# Patient Record
Sex: Female | Born: 1965 | Race: Black or African American | Hispanic: No | Marital: Married | Smoking: Never smoker
Health system: Southern US, Community
[De-identification: ages and names within clinical notes are randomized; demographics above are authoritative.]

## PROBLEM LIST (undated history)

## (undated) DIAGNOSIS — R5383 Other fatigue: Secondary | ICD-10-CM

## (undated) DIAGNOSIS — E785 Hyperlipidemia, unspecified: Secondary | ICD-10-CM

## (undated) DIAGNOSIS — G43829 Menstrual migraine, not intractable, without status migrainosus: Secondary | ICD-10-CM

## (undated) DIAGNOSIS — K5900 Constipation, unspecified: Secondary | ICD-10-CM

## (undated) DIAGNOSIS — R7303 Prediabetes: Secondary | ICD-10-CM

## (undated) DIAGNOSIS — E559 Vitamin D deficiency, unspecified: Secondary | ICD-10-CM

## (undated) DIAGNOSIS — M549 Dorsalgia, unspecified: Secondary | ICD-10-CM

## (undated) DIAGNOSIS — R0602 Shortness of breath: Secondary | ICD-10-CM

## (undated) HISTORY — DX: Prediabetes: R73.03

## (undated) HISTORY — PX: KELOID EXCISION: SHX1856

## (undated) HISTORY — DX: Hyperlipidemia, unspecified: E78.5

## (undated) HISTORY — PX: TUBAL LIGATION: SHX77

## (undated) HISTORY — DX: Dorsalgia, unspecified: M54.9

## (undated) HISTORY — DX: Other fatigue: R53.83

## (undated) HISTORY — DX: Shortness of breath: R06.02

## (undated) HISTORY — DX: Menstrual migraine, not intractable, without status migrainosus: G43.829

## (undated) HISTORY — DX: Constipation, unspecified: K59.00

## (undated) HISTORY — DX: Vitamin D deficiency, unspecified: E55.9

---

## 1998-01-15 ENCOUNTER — Other Ambulatory Visit: Admission: RE | Admit: 1998-01-15 | Discharge: 1998-01-15 | Payer: Self-pay | Admitting: Obstetrics and Gynecology

## 2000-02-12 ENCOUNTER — Other Ambulatory Visit: Admission: RE | Admit: 2000-02-12 | Discharge: 2000-02-12 | Payer: Self-pay | Admitting: Obstetrics and Gynecology

## 2000-07-10 ENCOUNTER — Emergency Department (HOSPITAL_COMMUNITY): Admission: EM | Admit: 2000-07-10 | Discharge: 2000-07-10 | Payer: Self-pay | Admitting: Emergency Medicine

## 2001-02-20 ENCOUNTER — Other Ambulatory Visit: Admission: RE | Admit: 2001-02-20 | Discharge: 2001-02-20 | Payer: Self-pay | Admitting: Obstetrics and Gynecology

## 2001-04-15 ENCOUNTER — Emergency Department (HOSPITAL_COMMUNITY): Admission: EM | Admit: 2001-04-15 | Discharge: 2001-04-15 | Payer: Self-pay | Admitting: Emergency Medicine

## 2001-04-15 ENCOUNTER — Encounter: Payer: Self-pay | Admitting: Emergency Medicine

## 2001-09-04 ENCOUNTER — Encounter: Admission: RE | Admit: 2001-09-04 | Discharge: 2001-12-03 | Payer: Self-pay | Admitting: Internal Medicine

## 2002-04-17 ENCOUNTER — Other Ambulatory Visit: Admission: RE | Admit: 2002-04-17 | Discharge: 2002-04-17 | Payer: Self-pay | Admitting: Obstetrics and Gynecology

## 2002-04-19 ENCOUNTER — Emergency Department (HOSPITAL_COMMUNITY): Admission: EM | Admit: 2002-04-19 | Discharge: 2002-04-19 | Payer: Self-pay | Admitting: Emergency Medicine

## 2003-05-13 ENCOUNTER — Other Ambulatory Visit: Admission: RE | Admit: 2003-05-13 | Discharge: 2003-05-13 | Payer: Self-pay | Admitting: Obstetrics and Gynecology

## 2003-07-24 ENCOUNTER — Encounter: Admission: RE | Admit: 2003-07-24 | Discharge: 2003-07-24 | Payer: Self-pay | Admitting: Obstetrics and Gynecology

## 2004-06-22 ENCOUNTER — Other Ambulatory Visit: Admission: RE | Admit: 2004-06-22 | Discharge: 2004-06-22 | Payer: Self-pay | Admitting: Obstetrics and Gynecology

## 2005-12-07 ENCOUNTER — Ambulatory Visit (HOSPITAL_COMMUNITY): Admission: RE | Admit: 2005-12-07 | Discharge: 2005-12-07 | Payer: Self-pay | Admitting: Obstetrics and Gynecology

## 2005-12-07 ENCOUNTER — Encounter (INDEPENDENT_AMBULATORY_CARE_PROVIDER_SITE_OTHER): Payer: Self-pay | Admitting: *Deleted

## 2006-04-26 HISTORY — PX: ENDOMETRIAL ABLATION: SHX621

## 2009-05-16 ENCOUNTER — Encounter: Admission: RE | Admit: 2009-05-16 | Discharge: 2009-05-16 | Payer: Self-pay | Admitting: Internal Medicine

## 2010-05-17 ENCOUNTER — Encounter: Payer: Self-pay | Admitting: Internal Medicine

## 2010-05-17 ENCOUNTER — Encounter: Payer: Self-pay | Admitting: Obstetrics and Gynecology

## 2010-07-20 ENCOUNTER — Encounter (INDEPENDENT_AMBULATORY_CARE_PROVIDER_SITE_OTHER): Payer: BC Managed Care – PPO | Admitting: Family Medicine

## 2010-07-20 ENCOUNTER — Other Ambulatory Visit: Payer: Self-pay | Admitting: Family Medicine

## 2010-07-20 ENCOUNTER — Other Ambulatory Visit (HOSPITAL_COMMUNITY)
Admission: RE | Admit: 2010-07-20 | Discharge: 2010-07-20 | Disposition: A | Discharge status: Home / Self Care | Payer: BC Managed Care – PPO | Source: Ambulatory Visit | Attending: Family Medicine | Admitting: Family Medicine

## 2010-07-20 DIAGNOSIS — Z0000 Encounter for general adult medical examination without abnormal findings: Secondary | ICD-10-CM

## 2010-07-20 DIAGNOSIS — R5383 Other fatigue: Secondary | ICD-10-CM

## 2010-07-20 DIAGNOSIS — Z124 Encounter for screening for malignant neoplasm of cervix: Secondary | ICD-10-CM | POA: Insufficient documentation

## 2010-07-22 ENCOUNTER — Other Ambulatory Visit: Payer: Self-pay | Admitting: Family Medicine

## 2010-07-22 DIAGNOSIS — Z1231 Encounter for screening mammogram for malignant neoplasm of breast: Secondary | ICD-10-CM

## 2010-08-05 ENCOUNTER — Ambulatory Visit
Admission: RE | Admit: 2010-08-05 | Discharge: 2010-08-05 | Disposition: A | Discharge status: Home / Self Care | Payer: BC Managed Care – PPO | Source: Ambulatory Visit | Attending: Family Medicine | Admitting: Family Medicine

## 2010-08-05 DIAGNOSIS — Z1231 Encounter for screening mammogram for malignant neoplasm of breast: Secondary | ICD-10-CM

## 2010-09-11 NOTE — Discharge Summary (Signed)
Anita Taylor, Anita Taylor              ACCOUNT NO.:  [id number]   MEDICAL RECORD NO.:  [id number]          PATIENT TYPE:  AMB   LOCATION:  SDC                           FACILITY:  [facility]   PHYSICIAN:  Duke Salvia. Marcelle Overlie, M.D.DATE OF BIRTH:  Apr 17, 1966   DATE OF ADMISSION:  DATE OF DISCHARGE:                                 DISCHARGE SUMMARY   DATE OF OUTPATIENT SURGERY:  December 07, 2005   CHIEF COMPLAINT:  For tubal ligation and endometrial ablation.   HISTORY OF PRESENT ILLNESS:  A 45 year old G1, P1 who is currently on OCPs  presents for permanent sterilization and endometrial ablation.  In the past  she has had bothersome menorrhagia and is concerned about coming off the  pill after her tubal and the resumption of heavy menses.  This procedure  including the permanence of the tubal procedure; failure rate of 2-06/998;  other risks relative to bleeding, infection, adjacent organ injury that may  require open or additional surgery reviewed.  The ablation procedure  including D&C hysteroscopy, the rates of hypo- and amenorrhea reviewed with  her which she understands and accepts.   PAST MEDICAL HISTORY:  Allergies:  None.  Current medications:  Allegra,  Maxalt, Ortho Tri-Cyclen.   REVIEW OF SYSTEMS:  Significant for a history of perimenstrual migraine.   FAMILY HISTORY:  Unremarkable.   PHYSICAL EXAMINATION:  VITAL SIGNS:  Temperature 98.2, blood pressure  110/70.  HEENT:  Unremarkable.  NECK:  Supple without masses.  LUNGS:  Clear.  CARDIOVASCULAR:  Regular rate and rhythm without murmurs, rubs, gallops  noted.  BREASTS:  Without masses.  ABDOMEN:  Soft, flat, nontender.  PELVIC:  Normal external genitalia, vagina and cervix clear.  Uterus mid  position, normal size.  Adnexa negative.  EXTREMITIES AND NEUROLOGIC:  Unremarkable.   IMPRESSION:  1. Requests permanent sterilization.  2. History of menorrhagia when not on oral contraceptive pills.   PLAN:  Laparoscopic tubal  ligation by Filshie clip application, D&C  hysteroscopy with NovaSure endometrial ablation.  Procedure and risks  reviewed as above.      Richard M. Marcelle Overlie, M.D.  Electronically Signed     RMH/MEDQ  D:  12/02/2005  T:  12/02/2005  Job:  [id number]

## 2010-09-11 NOTE — Op Note (Signed)
NAMEPHOUA, HOADLEY              ACCOUNT NO.:  [id number]   MEDICAL RECORD NO.:  [id number]          PATIENT TYPE:  AMB   LOCATION:  SDC                           [facility]:  [facility]   PHYSICIAN:  Duke Salvia. Marcelle Overlie, M.D.DATE OF BIRTH:  1965-06-07   DATE OF PROCEDURE:  12/07/2005  DATE OF DISCHARGE:                                 OPERATIVE REPORT   PREOPERATIVE DIAGNOSIS:  Menorrhagia, request for permanent sterilization.   POSTOPERATIVE DIAGNOSIS:  Menorrhagia, request for permanent sterilization.   PROCEDURE.:  Laparoscopic tubal ligation by Filshie clip application,  hysteroscopy with dilatation and curettage, NovaSure endometrial ablation,  paracervical block.   SURGEON:  Duke Salvia. Marcelle Overlie, M.D.   ANESTHESIA:  General endotracheal anesthesia.   COMPLICATIONS:  None.   DRAINS:  In and out Foley catheter.   SPECIMENS REMOVED:  Endometrial curettings.   PROCEDURE AND FINDINGS:  The patient was taken to the operating room.  After  an adequate level of general endotracheal anesthesia was obtained with the  patient legs stirrups, the abdomen, perineum and vagina were prepped and  draped in the usual manner for laparoscopy and D&C.  The bladder was  drained. EUA carried out.  The uterus mid position, normal size, adnexa  negative.  A Hulka tenaculum was positioned.   Attention was directed to the abdomen where the subumbilical area was  infiltrated with 0.5% Marcaine plain.  A small incision was made and the  Veress needle introduced without difficulty.  Intra-abdominal position was  verified by pressure and water testing.  After a 2.5 liter pneumoperitoneum  was created, the laparoscopic trocar and sleeve were then introduced without  difficulty.  There was no evidence of any bleeding or trauma.  The patient  was placed in Trendelenburg.  The pelvic findings were inspected and noted  to be normal. Starting on the right, the Marcaine 0.5% was dripped across  the tube from the  cornu to the fimbriated end, 4-5 mL on each side.  Filshie  clip applicator was then back loaded and Filshie clip applied 2 cm from the  cornu on the right after positively identifying the tube, completely  engulfing it at a right angle with excellent application. The exact same  repeated on the opposite side.  Reinspection of both tubes revealed  excellent placement, there was no bleeding.  These areas were photographed.  The  instrument was removed, gas allowed to escape.  The defect was closed  with 4-0 Dexon subcuticular suture.   Attention was directed to the hysteroscopy. The patient's legs were  extended, speculum was position, paracervical block created by infiltrating  at 3 and 9 o'clock submucosally 5-7 mL 1% Xylocaine on either side after  negative aspiration.  The uterus, itself, was sounded to 11 cm with a  cervical length of 4.  Progressively dilating to a 29 Pratt dilator, the 7  mm continuous flow hysteroscope was then used perform hysteroscopy revealing  no polyps or  submucosal fibroids. A D&C was carried out revealing minimal tissue.  After  this was completed, the NovaSure was carried out per protocol, passing the  CO2 test, cavity width of 4, power setting of 143 for 47 seconds.  She  tolerated this well and went to the recovery room in good condition.      Richard M. Marcelle Overlie, M.D.  Electronically Signed     RMH/MEDQ  D:  12/07/2005  T:  12/07/2005  Job:  [id number]

## 2010-12-16 ENCOUNTER — Telehealth: Payer: Self-pay | Admitting: *Deleted

## 2010-12-16 MED ORDER — SUMATRIPTAN-NAPROXEN SODIUM 85-500 MG PO TABS: 1.0000 | ORAL_TABLET | ORAL | Status: DC | PRN

## 2010-12-16 NOTE — Telephone Encounter (Signed)
Okay for #9

## 2010-12-16 NOTE — Telephone Encounter (Signed)
Cont: Treximet 85/500 to [employer] [facility] [place]?

## 2010-12-16 NOTE — Telephone Encounter (Signed)
Patient called stating that every year when her Treximet is called in a prior authorization is done ( which was previously done by her Gyn), this authorization is only good until 12/19/10 per a letter sent to her from her insurance carrier. Wanted to know if we could do another prior authorization for another year, the only way I know how to get this started is to call in rx to pharmacy and then they usually get the appropriate paperwork to [place] for a prior auth to be initiated by our front office, is it okay to call in an rx for Treximet 5mg 

## 2010-12-16 NOTE — Telephone Encounter (Signed)
Called in treximet 85/500 #9 AD 0rf's to [facility] @ [phone].

## 2011-02-04 ENCOUNTER — Telehealth: Payer: Self-pay | Admitting: Family Medicine

## 2011-02-04 NOTE — Telephone Encounter (Signed)
DONE

## 2011-02-23 ENCOUNTER — Inpatient Hospital Stay (INDEPENDENT_AMBULATORY_CARE_PROVIDER_SITE_OTHER)
Admission: RE | Admit: 2011-02-23 | Discharge: 2011-02-23 | Disposition: A | Discharge status: Home / Self Care | Payer: BC Managed Care – PPO | Source: Ambulatory Visit | Attending: Family Medicine | Admitting: Family Medicine

## 2011-02-23 DIAGNOSIS — M76899 Other specified enthesopathies of unspecified lower limb, excluding foot: Secondary | ICD-10-CM

## 2011-02-23 DIAGNOSIS — M779 Enthesopathy, unspecified: Secondary | ICD-10-CM

## 2011-02-24 ENCOUNTER — Ambulatory Visit: Payer: BC Managed Care – PPO | Admitting: Medical

## 2011-03-25 ENCOUNTER — Telehealth: Payer: Self-pay | Admitting: Family Medicine

## 2011-03-25 MED ORDER — SUMATRIPTAN-NAPROXEN SODIUM 85-500 MG PO TABS: 1.0000 | ORAL_TABLET | ORAL | Status: DC | PRN

## 2011-03-25 NOTE — Telephone Encounter (Signed)
Advise pt med was e-rx'd to her pharmacy

## 2011-03-25 NOTE — Telephone Encounter (Signed)
Called patient to let her know that her medication was called in. Left message.

## 2011-05-05 ENCOUNTER — Telehealth: Payer: Self-pay | Admitting: Internal Medicine

## 2011-05-05 MED ORDER — SUMATRIPTAN-NAPROXEN SODIUM 85-500 MG PO TABS: 1.0000 | ORAL_TABLET | ORAL | Status: DC | PRN

## 2011-05-05 NOTE — Telephone Encounter (Signed)
Refilled rx and left message for patient to call and schedule f/u appt.

## 2011-05-05 NOTE — Telephone Encounter (Signed)
Okay to refill x 1 only.  Needs to schedule f/u appt (Last seen in March?)

## 2011-05-21 ENCOUNTER — Encounter: Payer: Self-pay | Admitting: Internal Medicine

## 2011-06-02 ENCOUNTER — Encounter: Payer: BC Managed Care – PPO | Admitting: Family Medicine

## 2011-06-03 ENCOUNTER — Ambulatory Visit (INDEPENDENT_AMBULATORY_CARE_PROVIDER_SITE_OTHER): Payer: BC Managed Care – PPO | Admitting: Family Medicine

## 2011-06-03 ENCOUNTER — Encounter: Payer: Self-pay | Admitting: Family Medicine

## 2011-06-03 VITALS — BP 112/72 | HR 80 | Ht 65.5 in | Wt 230.0 lb

## 2011-06-03 DIAGNOSIS — G43829 Menstrual migraine, not intractable, without status migrainosus: Secondary | ICD-10-CM | POA: Insufficient documentation

## 2011-06-03 MED ORDER — NORTRIPTYLINE HCL 10 MG PO CAPS: ORAL_CAPSULE | ORAL | Status: DC

## 2011-06-03 MED ORDER — FROVATRIPTAN SUCCINATE 2.5 MG PO TABS: 2.5000 mg | ORAL_TABLET | ORAL | Status: DC | PRN

## 2011-06-03 NOTE — Progress Notes (Signed)
Migraines--headache, nausea, sometimes dizziness, sometimes light and sound sensitivity.  Previously got got 1-2 migraines during her menstrual cycle.  More recently, however, headaches have been more frequent, and not related to cycle.  LMP was 2 weeks ago, had headaches daily for a full week starting after her cycle ended.  Felt just like her usual migraines, and treximet worked, but headache would always recur the next day, back by the next morning.  She is in graduate school, daughter has rigorous dance schedule, and working--has been more stressed and rushed.  Not sleeping as well at night. Has trouble falling asleep, mind racing.  Sometimes falls asleep, but wakes up frequently.  She cut back on caffeine, but didn't help. Not exercising as much, but trying to walk more. Having 4-6 migraines/month.  Past Medical History  Diagnosis Date  . Migraine, menstrual     Past Surgical History  Procedure Date  . Tubal ligation   . Endometrial ablation 08  . Cesarean section     x1  . Keloid excision     ears    History   Social History  . Marital Status: Married    Spouse Name: N/A    Number of Children: 1  . Years of Education: N/A   Occupational History  . pre-K teacher at [employer]   Social History Main Topics  . Smoking status: Never Smoker   . Smokeless tobacco: Never Used  . Alcohol Use: Yes     a glass of wine every 3-6 months.  . Drug Use: No  . Sexually Active: Yes    Birth Control/ Protection: Surgical   Other Topics Concern  . Not on file   Social History Narrative  . No narrative on file    Family History  Problem Relation Age of Onset  . Hypertension Father   . Migraines Sister   . Diabetes Maternal Grandmother    Meds: Treximet prn  No Known Allergies  ROS:  Denies numbness, tingling, weakness, neuro symptoms, fevers, URI symptoms, sinus pain, cough, shortness of breath, GI complaints (other than related to her migraines) or  other concerns  PHYSICAL EXAM: BP 112/72  Pulse 80  Ht 5' 5.5" (1.664 m)  Wt 230 lb (104.327 kg)  BMI 37.69 kg/m2  LMP 05/20/2011 Well developed, pleasant overweight female in no distress HEENT:  PERRL, EOMI, conjunctiva clear, OP clear. Neck: no lymphadenopathy, thyromegaly or mass Neuro: Cranial nerves 2-12 intact.  Normal strength, sensation, finger-to-nose, gait, DTR's  ASSESSMENT/PLAN: 1. Menstrual migraine  frovatriptan (FROVA) 2.5 MG tablet, nortriptyline (PAMELOR) 10 MG capsule   Migraines, increasing in frequency and duration, no longer just related to menstrual cycle.  Likely triggers include increased stress, poor sleep.  Start preventative meds.  Nortriptylene 10 mg at bedtime.  Increase to 2 at bedtime if not adequately helping sleep, or increase after a month if migraines aren't improved at the 10mg .  Try and exercise daily, get a normal routine, don't skip meals, get adequate sleep.  If only mild/early headache--start with EITHER 800 mg ibuprofen OR 2 aspirin OR 2 Aleve, take with caffeine (coffee or coke).  If headache not improving within an hour, then take migraine-specific medication (ie Frova).  Keep headache log and f/u in 2 months, sooner prn

## 2011-06-03 NOTE — Patient Instructions (Signed)
nortriptylene 10 mg at bedtime.  Increase to 2 at bedtime if not adequately helping sleep, or increase after a month if migraines aren't improved at the 10mg .  Try and exercise daily, get a normal routine, don't skip meals, get adequate sleep.  If only mild/early headache--start with EITHER 800 mg ibuprofen OR 2 aspirin OR 2 Aleve, take with caffeine (coffee or coke).  If headache not improving within an hour, then take migraine-specific medication (ie Frova).

## 2011-06-07 ENCOUNTER — Telehealth: Payer: Self-pay | Admitting: Family Medicine

## 2011-06-07 DIAGNOSIS — G43829 Menstrual migraine, not intractable, without status migrainosus: Secondary | ICD-10-CM

## 2011-06-07 MED ORDER — NAPROXEN 500 MG PO TABS: 500.0000 mg | ORAL_TABLET | Freq: Two times a day (BID) | ORAL | Status: DC

## 2011-06-07 MED ORDER — FROVATRIPTAN SUCCINATE 2.5 MG PO TABS: 2.5000 mg | ORAL_TABLET | ORAL | Status: DC | PRN

## 2011-06-07 NOTE — Telephone Encounter (Signed)
E-rxd

## 2011-06-07 NOTE — Telephone Encounter (Signed)
Spoke with patient and she stated that she has used her daughters naproxen and it doesn't work for her. Excedrin migraine sometimes works. Also she was given #2 sample of the Frova, she is ok with trying this for a migraine but would like to know if you could call in some for her? I suggested that she first try the samples but she said she was worried about getting a migraine and using the two samples and not being able to have extra on hand if needed. Please advise. Thanks.

## 2011-06-07 NOTE — Telephone Encounter (Signed)
Left msg for pt to return my call

## 2011-06-07 NOTE — Telephone Encounter (Signed)
I think the Frova may work better at resolving her migraine (due to longer 1/2 life).  I recommend she take Naprosyn 500 mg up to twice daily with food as needed for headaches, and only use the Frova prn migraines, rather than going back on the treximet (which is a combo of naprosyn and imitrex, and isn't to be used as often as she was using it, or it loses efficacy).  If she is agreeable to trying this (and stopping the nortriptylene), then okay for #60 of Naprosyn 500mg .  (which is never to be used along with treximet, if she goes back on that in the future).

## 2011-06-30 ENCOUNTER — Telehealth: Payer: Self-pay | Admitting: *Deleted

## 2011-06-30 NOTE — Telephone Encounter (Deleted)
Patient called and Frova is working well

## 2011-06-30 NOTE — Telephone Encounter (Signed)
Error

## 2011-08-05 ENCOUNTER — Ambulatory Visit: Payer: BC Managed Care – PPO | Admitting: Family Medicine

## 2011-08-24 ENCOUNTER — Other Ambulatory Visit: Payer: Self-pay | Admitting: Family Medicine

## 2011-08-24 DIAGNOSIS — Z1231 Encounter for screening mammogram for malignant neoplasm of breast: Secondary | ICD-10-CM

## 2011-08-30 ENCOUNTER — Ambulatory Visit
Admission: RE | Admit: 2011-08-30 | Discharge: 2011-08-30 | Disposition: A | Discharge status: Home / Self Care | Payer: BC Managed Care – PPO | Source: Ambulatory Visit | Attending: Family Medicine | Admitting: Family Medicine

## 2011-08-30 ENCOUNTER — Ambulatory Visit: Payer: BC Managed Care – PPO

## 2011-08-30 DIAGNOSIS — Z1231 Encounter for screening mammogram for malignant neoplasm of breast: Secondary | ICD-10-CM

## 2011-09-13 ENCOUNTER — Ambulatory Visit (INDEPENDENT_AMBULATORY_CARE_PROVIDER_SITE_OTHER): Payer: BC Managed Care – PPO | Admitting: Family Medicine

## 2011-09-13 ENCOUNTER — Encounter: Payer: Self-pay | Admitting: Family Medicine

## 2011-09-13 VITALS — BP 120/82 | HR 72 | Ht 65.5 in | Wt 226.0 lb

## 2011-09-13 DIAGNOSIS — G43829 Menstrual migraine, not intractable, without status migrainosus: Secondary | ICD-10-CM

## 2011-09-13 DIAGNOSIS — G43909 Migraine, unspecified, not intractable, without status migrainosus: Secondary | ICD-10-CM

## 2011-09-13 MED ORDER — TOPIRAMATE 25 MG PO TABS: ORAL_TABLET | ORAL | Status: DC

## 2011-09-13 MED ORDER — FROVATRIPTAN SUCCINATE 2.5 MG PO TABS: 2.5000 mg | ORAL_TABLET | ORAL | Status: DC | PRN

## 2011-09-13 NOTE — Progress Notes (Signed)
Chief Complaint  Patient presents with  . Follow-up    f/u on migraines.    HPI:  Patient presents for follow up on migraines.  Last seen in February, at which time she was started on nortriptylene, but had to stop due to it causing severe headaches. She has been taking naproxen--took it twice daily initially, but more recently has taken only when she gets a headache.  It works sometimes.  Frova doesn't seem to work as fast, liked the treximet better (but was using it frequently, at least 4x/month, and worked quickly).  With Frova, headaches seem to linger, even after taking the medication.  Had 3-5 migraines this month--unsure if allergies/illness contributed (had a cold, sinus pain), treated it with migraine meds only 3 times, twice headaches got better with decongestants and allergy meds alone.  Headaches are reportedly around her periods, but on further questioning it appears that they are sometimes the week before, sometimes during the cycle, sometimes the week after (so really NOT related to periods).  Usually has a headache that will recur for about 3 days in a row, a couple of times a month.  Her sleeping pattern is better than when we discussed it at last visit.  Has had less stress because she has finished graduate school semester, but starting summer semester this week (so might get worse).  Doesn't seem to note any other triggers.  Past Medical History  Diagnosis Date  . Migraine, menstrual    Past Surgical History  Procedure Date  . Tubal ligation   . Endometrial ablation 08  . Cesarean section     x1  . Keloid excision     ears   History   Social History  . Marital Status: Married    Spouse Name: N/A    Number of Children: 1  . Years of Education: N/A   Occupational History  . pre-K teacher at [employer]   Social History Main Topics  . Smoking status: Never Smoker   . Smokeless tobacco: Never Used  . Alcohol Use: Yes     a glass of wine  every 3-6 months.  . Drug Use: No  . Sexually Active: Yes    Birth Control/ Protection: Surgical   Other Topics Concern  . Not on file   Social History Narrative  . No narrative on file   Current Outpatient Prescriptions on File Prior to Visit  Medication Sig Dispense Refill  . naproxen (NAPROSYN) 500 MG tablet Take 1 tablet (500 mg total) by mouth 2 (two) times daily with a meal.  60 tablet  0  . topiramate (TOPAMAX) 25 MG tablet Start 1 tablet at bedtime.  Increase to 1 tablet twice daily after a week.  If needed, can continue to taper up to dose of 50mg  twice daily  120 tablet  1  . DISCONTD: frovatriptan (FROVA) 2.5 MG tablet Take 1 tablet (2.5 mg total) by mouth as needed for migraine. If recurs, may repeat after 2 hours. Max of 3 tabs in 24 hours.  9 tablet  0   No Known Allergies  ROS:  Denies fevers.  Recent URI/allergy symptoms have improved. Denies chest pain, numbness/tingling, other neuro symptoms.  Denies GI complaints, bleeding/bruising, dizziness, skin rash or other problems.  Hasn't lost the weight like she wants.  Sleeping a little better than when under more stress in past.  PHYSICAL EXAM: BP 120/82  Pulse 72  Ht 5' 5.5" (1.664 m)  Wt 226 lb (  102.513 kg)  BMI 37.04 kg/m2  LMP 08/27/2011 Well developed, pleasant female in no distress Neck: no lymphadenopathy, thyromegaly or mass Heart: regular rate and rhythm Lungs: clear bilaterally Abdomen: soft, nontender Extremities: no edema Skin: no rash Psych: normal mood, affect, hygiene and grooming Neuro: alert and oriented. Cranial nerves intact. Normal strength, gait, sensation  ASSESSMENT/PLAN: 1. Menstrual migraine  frovatriptan (FROVA) 2.5 MG tablet  2. Migraine headache  topiramate (TOPAMAX) 25 MG tablet   Migraines--don't seem to be related to hormones.  Hasn't been keeping a calendar.  Instructed on how to keep headache journal/calendar.  Discussed other preventative meds--considered Paxil/Zoloft due to  problems sleeping/stress, but right now is fine, might cause weight gain, and prefers to try topamax (which might help weight).  Risks and side effects of SSRI's and Topamax reviewed.  Discussed how to start topamax and gradually increase to 50mg  BID. May continue to use Naproxen and Frova for acute headaches.  Continue Frova prn (offered to change back to treximet if she prefers--wants to stay with the Frova and naproxen)  Reviewed risks and side effects of medications in detail.  F/u 2-3 months on headaches

## 2011-09-13 NOTE — Patient Instructions (Signed)
Start topamax 25 mg at bedtime for one week.  Increase to 25 mg twice daily.  If tolerating without side effects (and still having headaches) then increase to 25 mg in morning, 50 mg at bedtime, and can further increase to 50mg  twice daily (if not having side effects).  If you are having significant side effects, then resume lower dose.  Please keep track of headaches on a calendar--track frequency, severity, and also mark your periods, as well as other potential triggers (stress, finals, lack of sleep, wine)

## 2011-09-22 ENCOUNTER — Other Ambulatory Visit: Payer: Self-pay | Admitting: Family Medicine

## 2011-09-23 ENCOUNTER — Telehealth: Payer: Self-pay | Admitting: Family Medicine

## 2011-09-23 MED ORDER — NAPROXEN 500 MG PO TABS: 500.0000 mg | ORAL_TABLET | Freq: Two times a day (BID) | ORAL | Status: DC

## 2011-09-23 NOTE — Telephone Encounter (Signed)
done

## 2011-12-13 ENCOUNTER — Ambulatory Visit: Payer: BC Managed Care – PPO | Admitting: Family Medicine

## 2011-12-29 ENCOUNTER — Encounter: Payer: Self-pay | Admitting: Family Medicine

## 2011-12-29 ENCOUNTER — Ambulatory Visit (INDEPENDENT_AMBULATORY_CARE_PROVIDER_SITE_OTHER): Payer: BC Managed Care – PPO | Admitting: Family Medicine

## 2011-12-29 VITALS — BP 116/74 | HR 76 | Ht 65.5 in | Wt 214.0 lb

## 2011-12-29 DIAGNOSIS — Z23 Encounter for immunization: Secondary | ICD-10-CM

## 2011-12-29 DIAGNOSIS — G43909 Migraine, unspecified, not intractable, without status migrainosus: Secondary | ICD-10-CM

## 2011-12-29 DIAGNOSIS — G43829 Menstrual migraine, not intractable, without status migrainosus: Secondary | ICD-10-CM

## 2011-12-29 MED ORDER — TOPIRAMATE 25 MG PO TABS: ORAL_TABLET | ORAL | Status: DC

## 2011-12-29 MED ORDER — NAPROXEN 500 MG PO TABS: 500.0000 mg | ORAL_TABLET | ORAL | Status: DC | PRN

## 2011-12-29 MED ORDER — FROVATRIPTAN SUCCINATE 2.5 MG PO TABS: 2.5000 mg | ORAL_TABLET | ORAL | Status: AC | PRN

## 2011-12-29 NOTE — Patient Instructions (Addendum)
Given that your headaches have gotten a little more frequent since school started, increase topamax to 2 tablets in morning 1 at night.  After 1-2 weeks, if still having headaches, you can further increase to 2 tablets twice daily (50 mg twice daily).  Return in 6 months, sooner if having side effects, or headaches persist.

## 2011-12-29 NOTE — Progress Notes (Signed)
Chief Complaint  Patient presents with  . Migraine    follow up.   HPI:  She started Topamax at last visit.  She is currently taking 50mg  in the morning.  Headache frequency is much less--only having 2 headaches a month, and is relieved by Naproxen or Excedrin.  Since she started back with classes, has had slight increase in frequency, but headaches over the summer were much better. Hasn't needed to use Frova (and has been out).  Appetite is decreased a little, and is noted to have lost 12 pounds since her last visit.  She has tried cutting back on her late night eating also, to help with weight loss.  Denies any significant side effects to medication, and overall is pleased with the result.  Past Medical History  Diagnosis Date  . Migraine, menstrual    Past Surgical History  Procedure Date  . Tubal ligation   . Endometrial ablation 08  . Cesarean section     x1  . Keloid excision     ears    Current Outpatient Prescriptions on File Prior to Visit  Medication Sig Dispense Refill  . aspirin-acetaminophen-caffeine (EXCEDRIN MIGRAINE) 250-250-65 MG per tablet Take 2 tablets by mouth as needed.       Marland Kitchen DISCONTD: topiramate (TOPAMAX) 25 MG tablet Start 1 tablet at bedtime.  Increase to 1 tablet twice daily after a week.  If needed, can continue to taper up to dose of 50mg  twice daily  120 tablet  1   No Known Allergies  ROS:  Denies fevers, URI symptoms, chest pain, trouble sleeping, fatigue, GI complaints or other concerns.  PHYSICAL EXAM: BP 116/74  Pulse 76  Ht 5' 5.5" (1.664 m)  Wt 214 lb (97.07 kg)  BMI 35.07 kg/m2  LMP 12/14/2011 Well developed, pleasant female, in no distress Normal mood, affect, hygiene and grooming   ASSESSMENT/PLAN: 1. Migraine headache  topiramate (TOPAMAX) 25 MG tablet, frovatriptan (FROVA) 2.5 MG tablet, naproxen (NAPROSYN) 500 MG tablet  2. Need for prophylactic vaccination and inoculation against influenza  Flu vaccine greater than or equal to  3yo preservative free IM  3. Menstrual migraine       Continue to taper up on the Topamax, up to 50 mg twice daily, given that headache frequency has picked up since classes restarted.  F/u 6 months for CPE, sooner prn (last CPE was 07/2010)

## 2012-01-07 ENCOUNTER — Telehealth: Payer: Self-pay | Admitting: Family Medicine

## 2012-01-07 NOTE — Telephone Encounter (Signed)
Advise pt to try PM products--ie meds containing diphenhydramine (ingredient in benadryl, which is usually in night-time medications to help with sleep).  In order to get a rx sleeping med, needs OV to discuss, given the potential risks/side effects, as she has never taken any of them before.  (If she has had rx meds in the past that she has done well with, let me know--nothing in her computer record)

## 2012-01-07 NOTE — Telephone Encounter (Signed)
Called pt left message word for word on pt phone

## 2012-01-19 ENCOUNTER — Other Ambulatory Visit (INDEPENDENT_AMBULATORY_CARE_PROVIDER_SITE_OTHER): Payer: BC Managed Care – PPO

## 2012-01-19 ENCOUNTER — Other Ambulatory Visit: Payer: BC Managed Care – PPO

## 2012-01-19 DIAGNOSIS — Z23 Encounter for immunization: Secondary | ICD-10-CM

## 2012-01-21 LAB — TB SKIN TEST: TB Skin Test: NEGATIVE

## 2012-04-03 ENCOUNTER — Telehealth: Payer: Self-pay | Admitting: Family Medicine

## 2012-04-03 NOTE — Telephone Encounter (Signed)
She has been on treximet in the past, and found it less effective than the Frova.  This medications contains sumatriptan--so will they really make her try a generic sumatriptan? Even though she took a med containing sumatriptan without benefit?  If so, then change to generic imitrex and let patient know why we are doing this (but it doesn't make sense, as med wasn't effective)

## 2012-04-03 NOTE — Telephone Encounter (Signed)
DO YOU WANT TO SWITCH TO GENERIC TRIPTAN

## 2012-04-05 NOTE — Telephone Encounter (Signed)
Appeal letter sent 

## 2012-04-14 ENCOUNTER — Telehealth: Payer: Self-pay | Admitting: Family Medicine

## 2012-04-14 NOTE — Telephone Encounter (Signed)
LM

## 2012-08-02 ENCOUNTER — Other Ambulatory Visit: Payer: Self-pay

## 2012-08-02 DIAGNOSIS — Z1231 Encounter for screening mammogram for malignant neoplasm of breast: Secondary | ICD-10-CM

## 2012-08-30 ENCOUNTER — Ambulatory Visit
Admission: RE | Admit: 2012-08-30 | Discharge: 2012-08-30 | Disposition: A | Discharge status: Home / Self Care | Payer: BC Managed Care – PPO | Source: Ambulatory Visit

## 2012-08-30 DIAGNOSIS — Z1231 Encounter for screening mammogram for malignant neoplasm of breast: Secondary | ICD-10-CM

## 2012-08-30 LAB — HM MAMMOGRAPHY: HM Mammogram: NORMAL

## 2012-09-04 ENCOUNTER — Encounter: Payer: Self-pay | Admitting: Medical

## 2012-09-04 ENCOUNTER — Ambulatory Visit (INDEPENDENT_AMBULATORY_CARE_PROVIDER_SITE_OTHER): Payer: BC Managed Care – PPO | Admitting: Medical

## 2012-09-04 VITALS — BP 110/80 | HR 80 | Temp 98.2°F | Resp 16 | Wt 235.0 lb

## 2012-09-04 DIAGNOSIS — G43909 Migraine, unspecified, not intractable, without status migrainosus: Secondary | ICD-10-CM

## 2012-09-04 DIAGNOSIS — M549 Dorsalgia, unspecified: Secondary | ICD-10-CM

## 2012-09-04 MED ORDER — CYCLOBENZAPRINE HCL 5 MG PO TABS: ORAL_TABLET | ORAL | Status: DC

## 2012-09-04 MED ORDER — TRAMADOL HCL 50 MG PO TABS: 50.0000 mg | ORAL_TABLET | Freq: Four times a day (QID) | ORAL | Status: DC | PRN

## 2012-09-04 MED ORDER — NAPROXEN 500 MG PO TABS: 500.0000 mg | ORAL_TABLET | ORAL | Status: DC | PRN

## 2012-09-04 NOTE — Patient Instructions (Addendum)
Your back pain seems to be musculoskeletal.  Recommendations:  For the next 5-7 days,   Use rest in general, no heavy lifting  I do want you stretching and use range of motion exercise   Use your Naproxen twice daily the rest of the week, then back to as needed  If need, you can use Ultram pain medication, one tablet every 6 hours.   Reserve this for worse pain.  You can try muscle relaxer at bedtime ONLY to help with spasm of the low back.  This is short term.  This will cause drowsiness  Consider massage  Consider heat  If worse or not improving, recheck  [facility] [address] [place] [address] [place], [place] [address] [phone] [email or web address]

## 2012-09-04 NOTE — Progress Notes (Signed)
Subjective:  Anita Taylor is a 47 y.o. female who presents for c/o back pain.  She reports 3 day hx/o low back pain.  She reports catching or pulling in the middle of her back, worsening.  This morning shooting pain from back to abdomen, R>L.  Denies trauma, no injury, no inciting incident.   Worse with twisting motion.  Better with standing still.  Not having numbness, tingling, weakness.  No bowel or bladder incontinence, no NVD, no GU symptoms.    Hx/o bursitis in right hip, but no other prior back pain.  Has used some Naproxen, used 1 tramadol from husband last night, took the edge off.  Heat also helped.  Walks some for exercise. She is a Manufacturing systems engineer.  No other aggravating or relieving factors.    No other c/o.  The following portions of the patient's history were reviewed and updated as appropriate: allergies, current medications, past family history, past medical history, past social history, past surgical history and problem list.  ROS Otherwise as in subjective above  Objective: Physical Exam  Vital signs reviewed  General appearance: alert, no distress, WD/WN, AA female Heart: RRR, normal S1, S2, no murmurs Lungs: CTA bilaterally, no wheezes, rhonchi, or rales Abdomen: +bs, soft, non tender, non distended, no masses, no hepatomegaly, no splenomegaly Back: tender right lumbar paraspinal region, otherwise nontender, mild pain with back flexion, but normal ROM, no obvious deformity MSK: hips and legs nontender, normal ROM Pulses: 2+ radial pulses, 2+ pedal pulses, normal cap refill Neuro: -SLR, normal heel and toe walk, normal DTRs and sensation Ext: no edema   Assessment: Back pain  Plan: Your back pain seems to be musculoskeletal in nature.  Recommendations:  For the next 5-7 days:   Use rest in general, no heavy lifting  I do want you stretching and use range of motion exercise   Use your Naproxen twice daily the rest of the week, then back to as  needed  If need, you can use Ultram pain medication, one tablet every 6 hours.   Reserve this for worse pain.  You can try muscle relaxer at bedtime ONLY to help with spasm of the low back.  This is short term.  This will cause drowsiness  Consider massage  Consider heat  If worse or not improving, recheck

## 2013-07-24 ENCOUNTER — Other Ambulatory Visit: Payer: Self-pay

## 2013-07-24 DIAGNOSIS — Z1231 Encounter for screening mammogram for malignant neoplasm of breast: Secondary | ICD-10-CM

## 2013-07-25 ENCOUNTER — Other Ambulatory Visit (HOSPITAL_COMMUNITY)
Admission: RE | Admit: 2013-07-25 | Discharge: 2013-07-25 | Disposition: A | Discharge status: Home / Self Care | Payer: BC Managed Care – PPO | Source: Ambulatory Visit | Attending: Family Medicine | Admitting: Family Medicine

## 2013-07-25 ENCOUNTER — Ambulatory Visit (INDEPENDENT_AMBULATORY_CARE_PROVIDER_SITE_OTHER): Payer: BC Managed Care – PPO | Admitting: Family Medicine

## 2013-07-25 ENCOUNTER — Encounter: Payer: Self-pay | Admitting: Family Medicine

## 2013-07-25 VITALS — BP 98/64 | HR 72 | Ht 66.0 in | Wt 240.0 lb

## 2013-07-25 DIAGNOSIS — Z01419 Encounter for gynecological examination (general) (routine) without abnormal findings: Secondary | ICD-10-CM | POA: Insufficient documentation

## 2013-07-25 DIAGNOSIS — Z0000 Encounter for general adult medical examination without abnormal findings: Secondary | ICD-10-CM

## 2013-07-25 DIAGNOSIS — R5381 Other malaise: Secondary | ICD-10-CM

## 2013-07-25 DIAGNOSIS — Z1151 Encounter for screening for human papillomavirus (HPV): Secondary | ICD-10-CM | POA: Insufficient documentation

## 2013-07-25 DIAGNOSIS — R5383 Other fatigue: Secondary | ICD-10-CM

## 2013-07-25 DIAGNOSIS — G43909 Migraine, unspecified, not intractable, without status migrainosus: Secondary | ICD-10-CM

## 2013-07-25 LAB — POCT URINALYSIS DIPSTICK
Bilirubin, UA: NEGATIVE
Glucose, UA: NEGATIVE
KETONES UA: NEGATIVE
Leukocytes, UA: NEGATIVE
Nitrite, UA: NEGATIVE
PROTEIN UA: NEGATIVE
SPEC GRAV UA: 1.02
UROBILINOGEN UA: NEGATIVE
pH, UA: 5

## 2013-07-25 MED ORDER — SUMATRIPTAN SUCCINATE 50 MG PO TABS: ORAL_TABLET | ORAL | Status: DC

## 2013-07-25 NOTE — Progress Notes (Signed)
Chief Complaint  Patient presents with  . Annual Exam    nonfasting annual exam with pap. UA showed 1+ blood, no symptoms. Did not do eye exam as she just had one.  Would like to discuss her migraines.    Anita Taylor is a 48 y.o. female who presents for a complete physical.  She has the following concerns:  Migraine headaches:  Last visit with me was in 12/2011.  At that time she had been started on topamax 50mg  daily.  Her headaches had been much improved, 2/month and were relieved by naproxen.  Given that headaches were increasing when back in school, recommendation was to increase dose to BID.  Pt hasn't been back since.  Since that time her headaches have changed--they were more often, related to school back then.  Currently headaches are once a month, around her periods--sometimes before, sometimes after.  They tend to last 3-4 days.  Naproxen 500mg  does not help.  She has been out of Frova for quite a while.  She has also taken treximet in the past.  She has pain that is unilateral, but usually extending from the forehead back to behind her ear.  Sometimes she will get an aura (slight dizziness, some vision changes).  No vomiting, but has mild nausea.  +photophobia and phonophobia.  Periods have been starting to come back after having had ablation.  Periods are getting a little heavier, with some clots, and lasting longer.  Some blood in urine noted at visit--she thinks there might be still a little spotting.  Immunization History  Administered Date(s) Administered  . Influenza Split 12/29/2011  . PPD Test 01/19/2012  . Tdap 01/19/2012  didn't get flu shot this past year, usually she does Last Pap smear: 06/2010 Last mammogram: 08/2012 Last colonoscopy: never Last DEXA: never Dentist: twice yearly Ophtho: January--got reading glasses Exercise:  She recently started back walking, currently 3x/week  Past Medical History  Diagnosis Date  . Migraine, menstrual     Past Surgical  History  Procedure Laterality Date  . Tubal ligation    . Endometrial ablation  08  . Cesarean section      x1  . Keloid excision      ears    History   Social History  . Marital Status: Married    Spouse Name: N/A    Number of Children: 1  . Years of Education: N/A   Occupational History  . 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 Smoking status: Never Smoker   . Smokeless tobacco: Never Used  . Alcohol Use: Yes     Comment: a glass of wine every 3-6 months.  . Drug Use: No  . Sexual Activity: Yes    Partners: Male    Birth Control/ Protection: Surgical   Other Topics Concern  . Not on file   Social History Narrative   Lives with husband, daughter, 1 dog.  No smoke exposure    Family History  Problem Relation Age of Onset  . Hypertension Father   . Migraines Sister   . Diabetes Maternal Grandmother   . Hypertension Mother   . Cancer Neg Hx    Outpatient Encounter Prescriptions as of 07/25/2013  Medication Sig Note  . aspirin-acetaminophen-caffeine (EXCEDRIN MIGRAINE) 250-250-65 MG per tablet Take 2 tablets by mouth as needed.  07/25/2013: Takes once a month, around her cycle for a migraine.  Keeps her awake at night, so sometimes will  avoid  . Multiple Vitamins-Minerals (MULTIVITAMIN WITH MINERALS) tablet Take 1 tablet by mouth daily.   . naproxen (NAPROSYN) 500 MG tablet Take 1 tablet (500 mg total) by mouth as needed. 07/25/2013: This was prescribed to be used along with triptan, but has been out of triptans.  Using this prn migraine, not helping  . vitamin B-12 (CYANOCOBALAMIN) 500 MCG tablet Take 500 mcg by mouth daily.   . Flaxseed, Linseed, (FLAX SEEDS) POWD Take 15 mLs by mouth daily. 07/25/2013: Uses in smoothies, ran out  . SUMAtriptan (IMITREX) 50 MG tablet 1 tablet at onset of migraine. May repeat in 2 hours if headache persists or recurs. Max 100mg /24 hrs   . [DISCONTINUED] cyclobenzaprine (FLEXERIL) 5 MG tablet QHS  prn   . [DISCONTINUED] topiramate (TOPAMAX) 25 MG tablet Take 2 tablets in the morning, and 1 tablet in evening.  May increase to 50mg  twice daily if headaches persist 09/04/2012: Prn   . [DISCONTINUED] traMADol (ULTRAM) 50 MG tablet Take 1 tablet (50 mg total) by mouth every 6 (six) hours as needed for pain.    No Known Allergies  ROS:  The patient denies anorexia, fever, decreased hearing, ear pain, sore throat, breast concerns, chest pain, palpitations, dizziness, syncope, dyspnea on exertion, cough, swelling, nausea, vomiting, diarrhea, constipation, abdominal pain, melena, hematochezia, indigestion/heartburn, hematuria, incontinence, dysuria, irregular menstrual cycles, vaginal discharge, odor or itch, genital lesions, joint pains, numbness, tingling, weakness, tremor, suspicious skin lesions, depression, anxiety, abnormal bleeding/bruising, or enlarged lymph nodes.  Weight is up 5 pounds since last visit 08/2012.  She got down to 180 3-4 years ago with [employer]. Just got reading glasses  PHYSICAL EXAM:  BP 98/64  Pulse 72  Ht 5\' 6"  (1.676 m)  Wt 240 lb (108.863 kg)  BMI 38.76 kg/m2  LMP 07/15/2013  General Appearance:    Alert, cooperative, no distress, appears stated age  Head:    Normocephalic, without obvious abnormality, atraumatic  Eyes:    PERRL, conjunctiva/corneas clear, EOM's intact, fundi    benign  Ears:    Normal TM's and external ear canals  Nose:   Nares normal, mucosa normal, no drainage or sinus   tenderness  Throat:   Lips, mucosa, and tongue normal; teeth and gums normal  Neck:   Supple, no lymphadenopathy;  thyroid:  no   enlargement/tenderness/nodules; no carotid   bruit or JVD  Back:    Spine nontender, no curvature, ROM normal, no CVA     tenderness  Lungs:     Clear to auscultation bilaterally without wheezes, rales or     ronchi; respirations unlabored  Chest Wall:    No tenderness or deformity   Heart:    Regular rate and rhythm, S1 and S2 normal,  no murmur, rub   or gallop  Breast Exam:    No tenderness, masses, or nipple discharge or inversion.      No axillary lymphadenopathy  Abdomen:     Soft, non-tender, nondistended, normoactive bowel sounds,    no masses, no hepatosplenomegaly  Genitalia:    Normal external genitalia without lesions.  BUS and vagina normal; cervix is nulliparous, without lesions; no cervical motion tenderness. No abnormal vaginal discharge, although there is a small amount of bright red blood in the vaginal vault.  Uterus and adnexa not enlarged, nontender, no masses.  Pap performed  Rectal:    Normal tone, no masses or tenderness; guaiac negative stool  Extremities:   No clubbing, cyanosis or edema  Pulses:  2+ and symmetric all extremities  Skin:   Skin color, texture, turgor normal, no rashes or lesions  Lymph nodes:   Cervical, supraclavicular, and axillary nodes normal  Neurologic:   CNII-XII intact, normal strength, sensation and gait; reflexes 2+ and symmetric throughout          Psych:   Normal mood, affect, hygiene and grooming.    ASSESSMENT/PLAN:  Routine general medical examination at a health care facility - Plan: POCT Urinalysis Dipstick, CBC with Differential, Lipid panel, Vit D  25 hydroxy (rtn osteoporosis monitoring), TSH, Comprehensive metabolic panel, Cytology - PAP [place], CANCELED: Visual acuity screening, CANCELED: Cytology - PAP [place]  Migraine headache - menstrual; imitrex prn.  Keep track on calendar - Plan: SUMAtriptan (IMITREX) 50 MG tablet  Other malaise and fatigue - Plan: CBC with Differential, Vit D  25 hydroxy (rtn osteoporosis monitoring), TSH, Comprehensive metabolic panel   Discussed monthly self breast exams and yearly mammograms after the age of 78; at least 30 minutes of aerobic activity at least 5 days/week; proper sunscreen use reviewed; healthy diet, including goals of calcium and vitamin D intake and alcohol recommendations (less than or equal to 1  drink/day) reviewed; regular seatbelt use; changing batteries in smoke detectors.  Immunization recommendations discussed--flu shots recommended yearly.  Colonoscopy recommendations reviewed, age 27

## 2013-07-25 NOTE — Patient Instructions (Signed)

## 2013-07-27 ENCOUNTER — Encounter: Payer: Self-pay | Admitting: Family Medicine

## 2013-07-30 ENCOUNTER — Telehealth: Payer: Self-pay | Admitting: Family Medicine

## 2013-07-30 ENCOUNTER — Other Ambulatory Visit: Payer: BC Managed Care – PPO

## 2013-07-30 ENCOUNTER — Encounter: Payer: Self-pay | Admitting: Family Medicine

## 2013-07-30 DIAGNOSIS — R5381 Other malaise: Secondary | ICD-10-CM

## 2013-07-30 DIAGNOSIS — Z0000 Encounter for general adult medical examination without abnormal findings: Secondary | ICD-10-CM

## 2013-07-30 DIAGNOSIS — R5383 Other fatigue: Secondary | ICD-10-CM

## 2013-07-30 LAB — CBC WITH DIFFERENTIAL/PLATELET
Basophils Absolute: 0.1 K/uL (ref 0.0–0.1)
Basophils Relative: 1 % (ref 0–1)
Eosinophils Absolute: 0.2 K/uL (ref 0.0–0.7)
Eosinophils Relative: 4 % (ref 0–5)
HCT: 38.8 % (ref 36.0–46.0)
Hemoglobin: 12.4 g/dL (ref 12.0–15.0)
Lymphocytes Relative: 29 % (ref 12–46)
Lymphs Abs: 1.7 K/uL (ref 0.7–4.0)
MCH: 30.6 pg (ref 26.0–34.0)
MCHC: 32 g/dL (ref 30.0–36.0)
MCV: 95.8 fL (ref 78.0–100.0)
Monocytes Absolute: 0.3 K/uL (ref 0.1–1.0)
Monocytes Relative: 6 % (ref 3–12)
Neutro Abs: 3.4 K/uL (ref 1.7–7.7)
Neutrophils Relative %: 60 % (ref 43–77)
Platelets: 291 K/uL (ref 150–400)
RBC: 4.05 MIL/uL (ref 3.87–5.11)
RDW: 13.7 % (ref 11.5–15.5)
WBC: 5.7 K/uL (ref 4.0–10.5)

## 2013-07-30 LAB — COMPREHENSIVE METABOLIC PANEL
ALT: 8 U/L (ref 0–35)
AST: 15 U/L (ref 0–37)
Albumin: 3.9 g/dL (ref 3.5–5.2)
Alkaline Phosphatase: 56 U/L (ref 39–117)
BILIRUBIN TOTAL: 0.3 mg/dL (ref 0.2–1.2)
BUN: 12 mg/dL (ref 6–23)
CALCIUM: 9 mg/dL (ref 8.4–10.5)
CHLORIDE: 106 meq/L (ref 96–112)
CO2: 25 mEq/L (ref 19–32)
CREATININE: 0.67 mg/dL (ref 0.50–1.10)
Glucose, Bld: 90 mg/dL (ref 70–99)
Potassium: 3.9 mEq/L (ref 3.5–5.3)
Sodium: 140 mEq/L (ref 135–145)
Total Protein: 6.7 g/dL (ref 6.0–8.3)

## 2013-07-30 LAB — LIPID PANEL
Cholesterol: 193 mg/dL (ref 0–200)
HDL: 49 mg/dL (ref >39)
LDL CALC: 109 mg/dL — AB (ref 0–99)
TRIGLYCERIDES: 175 mg/dL — AB (ref <150)
Total CHOL/HDL Ratio: 3.9 Ratio
VLDL: 35 mg/dL (ref 0–40)

## 2013-07-30 NOTE — Telephone Encounter (Signed)
Pt called requesting letter for [employer] to show she had a cpe/pap on 07/25/13 for wellness.  Faxed to [employer] [phone]

## 2013-07-31 LAB — TSH: TSH: 1.753 u[IU]/mL (ref 0.350–4.500)

## 2013-07-31 LAB — VITAMIN D 25 HYDROXY (VIT D DEFICIENCY, FRACTURES): VIT D 25 HYDROXY: 27 ng/mL — AB (ref 30–89)

## 2013-08-31 ENCOUNTER — Ambulatory Visit
Admission: RE | Admit: 2013-08-31 | Discharge: 2013-08-31 | Disposition: A | Discharge status: Home / Self Care | Payer: BC Managed Care – PPO | Source: Ambulatory Visit

## 2013-08-31 DIAGNOSIS — Z1231 Encounter for screening mammogram for malignant neoplasm of breast: Secondary | ICD-10-CM

## 2013-09-05 ENCOUNTER — Telehealth: Payer: Self-pay | Admitting: Family Medicine

## 2013-09-05 DIAGNOSIS — G43909 Migraine, unspecified, not intractable, without status migrainosus: Secondary | ICD-10-CM

## 2013-09-05 NOTE — Telephone Encounter (Signed)
Left message for patient to return my call.

## 2013-09-05 NOTE — Telephone Encounter (Signed)
Ok to restart Topamax 50mg  qHS.  Ok for #30, rf x 2.  Schedule OV to f/u in 6-8wks.  Keep headache calendar and bring to visit.  Okay to refill imitrex, if needed

## 2013-09-06 MED ORDER — SUMATRIPTAN SUCCINATE 50 MG PO TABS: ORAL_TABLET | ORAL | Status: DC

## 2013-09-06 MED ORDER — TOPIRAMATE 50 MG PO TABS: 50.0000 mg | ORAL_TABLET | Freq: Every day | ORAL | Status: DC

## 2013-09-06 NOTE — Telephone Encounter (Signed)
Sent rx's for topamax and imitrex to [facility] [address]. Scheduled pt for 6-8 week follow up 10/22/13.

## 2013-10-22 ENCOUNTER — Ambulatory Visit: Payer: Self-pay | Admitting: Family Medicine

## 2013-10-23 ENCOUNTER — Telehealth: Payer: Self-pay | Admitting: Family Medicine

## 2013-10-23 NOTE — Telephone Encounter (Signed)
Called pt per Dr. Lynelle DoctorKnapp request.  Reached voice mail lmtrc need to r/s appt.

## 2013-11-03 ENCOUNTER — Other Ambulatory Visit: Payer: Self-pay | Admitting: Medical

## 2013-11-12 ENCOUNTER — Encounter: Payer: BC Managed Care – PPO | Admitting: Family Medicine

## 2013-11-22 ENCOUNTER — Encounter: Payer: Self-pay | Admitting: Family Medicine

## 2013-11-22 ENCOUNTER — Ambulatory Visit (INDEPENDENT_AMBULATORY_CARE_PROVIDER_SITE_OTHER): Payer: BC Managed Care – PPO | Admitting: Family Medicine

## 2013-11-22 VITALS — BP 102/68 | HR 64 | Ht 66.0 in

## 2013-11-22 DIAGNOSIS — G43829 Menstrual migraine, not intractable, without status migrainosus: Secondary | ICD-10-CM

## 2013-11-22 MED ORDER — TOPIRAMATE 50 MG PO TABS: 50.0000 mg | ORAL_TABLET | Freq: Every day | ORAL | Status: DC

## 2013-11-22 NOTE — Patient Instructions (Addendum)
Continue the topamax nightly for prevention of migraines. Continue to use naproxen or excedrin migraine for mild headaches, and use the imitrex for persistent migraines.  Keep headache calendar.  Return (with calendar) if headaches increase in frequency or severity, so that we can discuss potentially increasing your preventative medication dose.  Return in April for your physical.  Return sooner if not doing well re: headaches.

## 2013-11-22 NOTE — Progress Notes (Signed)
Chief Complaint  Patient presents with  . Migraine    folllow up on migraines.     Topamax was restarted about 7-8 weeks ago, after having 5-6 migraines in early May.  She presents for f/u on her headaches since being back on topamax.  She took it for 1 month, ran out about a week ago, and hasn't refilled it yet.  Headaches were much less frequent in June and July.  She isn't sure if it is the medication helping, or if it is because she is on break from work over the summer.  Denies side effects from topamax--just a dull headache for the first couple of days, that resolved.  Her last headache was in June, but only 1 day.  She took imitrex, and needed to repeat the dose.  No problems since then. Any headaches have been very mild, not a full migraine, and relieved by naproxen (and hasn't always needed to take it).  Per last OV, headaches have more recently been related to cycles.  She is having monthly cycles (came back after ablation), and headaches have been shorter/milder.  Hasn't yet had cycle for July.  Past Medical History  Diagnosis Date  . Migraine, menstrual    Past Surgical History  Procedure Laterality Date  . Tubal ligation    . Endometrial ablation  08  . Cesarean section      x1  . Keloid excision      ears   History   Social History  . Marital Status: Married    Spouse Name: N/A    Number of Children: 1  . Years of Education: N/A   Occupational History  . 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 Smoking status: Never Smoker   . Smokeless tobacco: Never Used  . Alcohol Use: Yes     Comment: a glass of wine every 3-6 months.  . Drug Use: No  . Sexual Activity: Yes    Partners: Male    Birth Control/ Protection: Surgical   Other Topics Concern  . Not on file   Social History Narrative   Lives with husband, daughter, 1 dog.  No smoke exposure   Outpatient Encounter Prescriptions as of 11/22/2013  Medication Sig Note   . aspirin-acetaminophen-caffeine (EXCEDRIN MIGRAINE) 250-250-65 MG per tablet Take 2 tablets by mouth as needed.  07/25/2013: Takes once a month, around her cycle for a migraine.  Keeps her awake at night, so sometimes will avoid  . Flaxseed, Linseed, (FLAX SEEDS) POWD Take 15 mLs by mouth daily. 11/22/2013: In smoothies  . Multiple Vitamins-Minerals (MULTIVITAMIN WITH MINERALS) tablet Take 1 tablet by mouth daily.   . naproxen (NAPROSYN) 500 MG tablet TAKE 1 TABLET BY MOUTH AS NEEDED 11/22/2013: Uses prn headaches, infrequent  . SUMAtriptan (IMITREX) 50 MG tablet 1 tablet at onset of migraine. May repeat in 2 hours if headache persists or recurs. Max 100mg /24 hrs 11/22/2013: Uses prn migraines  . topiramate (TOPAMAX) 50 MG tablet Take 1 tablet (50 mg total) by mouth at bedtime.   . vitamin B-12 (CYANOCOBALAMIN) 500 MCG tablet Take 500 mcg by mouth daily. 11/22/2013: Hasn't been taking it over the summer; will restart during school year  . [DISCONTINUED] topiramate (TOPAMAX) 50 MG tablet Take 1 tablet (50 mg total) by mouth at bedtime. 11/22/2013: Ran out 1-2 weeks ago   No Known Allergies  ROS:  Headaches per HPI.  Some weight gain, normal appetite.  No numbness, tingling,  dizziness, chest pain, shortness of breath, GI complaints or other concerns.  PHYSICAL EXAM: BP 102/68  Pulse 64  Ht 5\' 6"  (1.676 m)  PF 250 L/min  LMP 10/21/2013 Weight 250 per pt today--wearing heavy jewelry, but also went to [place] on vacation, and truly gained. Well developed, pleasant female in good spirits  ASSESSMENT/PLAN:  Menstrual migraine without status migrainosus, not intractable - Plan: topiramate (TOPAMAX) 50 MG tablet  Migraines are significantly improved since restarting topamax for prevention.  Restart.  F/u at CPE in April, sooner prn

## 2013-11-24 ENCOUNTER — Other Ambulatory Visit: Payer: Self-pay | Admitting: Family Medicine

## 2014-02-11 ENCOUNTER — Other Ambulatory Visit: Payer: Self-pay | Admitting: Family Medicine

## 2014-02-11 NOTE — Telephone Encounter (Signed)
Pt called wanting a refill on her imitrex. Pt states she is having headaches frequently. She states the week before her cycle, week of her cycle and then week after is when she has them. She can have headaches up to 3 times a week. She would like a 30 day or 90 day supply of her imitrex

## 2014-02-11 NOTE — Telephone Encounter (Signed)
She needs to schedule OV.  imitrex isn't to be used more than 2-3x/wk or it will become less effective.  We need to work on better preventative regimen.  Okay for #9 only, and schedule OV/med check on her migraines. She is having increased frequency 3 out of 4 weeks of the month per the message. Make sure that she is taking her topamax every night

## 2014-02-11 NOTE — Telephone Encounter (Signed)
LMTCB

## 2014-02-12 ENCOUNTER — Telehealth: Payer: Self-pay | Admitting: Family Medicine

## 2014-02-12 NOTE — Telephone Encounter (Signed)
Talked to patient and pt explained that she is not taking imitrex everytime she has a headache. She is taking Excedrin on some days and taking Imitrex other days. She is not taking her topamax anymore as she feels that is what started her headaches when she would take it. Pt is coming in Thursday afternoon to talk to you about this

## 2014-02-12 NOTE — Telephone Encounter (Signed)
Pt would like you to call her back.  I tried to help her but would like to talk to you

## 2014-02-13 NOTE — Telephone Encounter (Signed)
Talked to pt

## 2014-02-14 ENCOUNTER — Encounter: Payer: BC Managed Care – PPO | Admitting: Family Medicine

## 2014-02-25 ENCOUNTER — Encounter: Payer: Self-pay | Admitting: Family Medicine

## 2014-08-14 ENCOUNTER — Encounter: Payer: Self-pay | Admitting: Family Medicine

## 2014-08-14 ENCOUNTER — Ambulatory Visit (INDEPENDENT_AMBULATORY_CARE_PROVIDER_SITE_OTHER): Payer: BC Managed Care – PPO | Admitting: Family Medicine

## 2014-08-14 VITALS — BP 120/70 | HR 68 | Ht 65.5 in | Wt 228.6 lb

## 2014-08-14 DIAGNOSIS — G43829 Menstrual migraine, not intractable, without status migrainosus: Secondary | ICD-10-CM | POA: Diagnosis not present

## 2014-08-14 DIAGNOSIS — N943 Premenstrual tension syndrome: Secondary | ICD-10-CM | POA: Diagnosis not present

## 2014-08-14 DIAGNOSIS — Z0000 Encounter for general adult medical examination without abnormal findings: Secondary | ICD-10-CM

## 2014-08-14 DIAGNOSIS — E559 Vitamin D deficiency, unspecified: Secondary | ICD-10-CM

## 2014-08-14 DIAGNOSIS — N920 Excessive and frequent menstruation with regular cycle: Secondary | ICD-10-CM

## 2014-08-14 DIAGNOSIS — E782 Mixed hyperlipidemia: Secondary | ICD-10-CM | POA: Diagnosis not present

## 2014-08-14 DIAGNOSIS — E669 Obesity, unspecified: Secondary | ICD-10-CM

## 2014-08-14 LAB — POCT URINALYSIS DIPSTICK
Bilirubin, UA: NEGATIVE
Glucose, UA: NEGATIVE
Ketones, UA: NEGATIVE
NEG CONTROL: NEGATIVE
Nitrite, UA: NEGATIVE
PROTEIN UA: NEGATIVE
Spec Grav, UA: 1.025
UROBILINOGEN UA: NEGATIVE
pH, UA: 6

## 2014-08-14 MED ORDER — SUMATRIPTAN SUCCINATE 100 MG PO TABS: 100.0000 mg | ORAL_TABLET | Freq: Once | ORAL | Status: DC

## 2014-08-14 NOTE — Progress Notes (Signed)
Chief Complaint  Patient presents with  . Annual Exam    nonfasting annual exam with pap. No concerns. UA showed trace blood-no symptoms.    Anita FloorLisa A Leeds is a 49 y.o. female who presents for a complete physical.  She has the following concerns:  Migraines:  She had been on Topamax for migraine prevention (see July 2015 visit for more detailed history).  She recalls having a lingering, dull headache from the topamax, so didn't continue taking it--has been off since last summer.  She is having fewer migraines overall.  She is using Excedrin migraine--it helps temporarily, but headache comes back quickly. Headaches last about 3 days.  She gets only once (sometimes twice) a month, occuring either a week before or a week after her cycle.  She has used imitrex in the past, recalling a 50mg  dose that often needed a second dose.  Doesn't recall having any side effects.  [place]now [place] in the [employer] [place] rather than at [employer], and ? If lower frequency of headaches since then.  Vitamin D deficiency:  Last year level was slightly low at 27, when on a daily multivitamin (but no separate vitamin D).  She admits that she never added any additional vitamin D.   Periods have recurred since having her ablation.  They are heavier than they had been initially after recurring, lasting 3-4 days, not too heavy. Heavier than last year.  Immunization History  Administered Date(s) Administered  . Influenza Split 12/29/2011  . PPD Test 01/19/2012  . Tdap 01/19/2012   didn't get flu shot this past year Last Pap smear: 07/25/2013--normal, no high risk HPV present Last mammogram: 08/2013  Last colonoscopy: never Last DEXA: never  Dentist: twice yearly Ophtho: January 2015; got reading glasses Exercise: walking 30 minutes, currently 2x/week but plans to increase to 4x/week (program at work).  No weight-bearing activity. Lipids: last checked 1 year ago: Lab Results  Component Value  Date   CHOL 193 07/30/2013   HDL 49 07/30/2013   LDLCALC 109* 07/30/2013   TRIG 175* 07/30/2013   CHOLHDL 3.9 07/30/2013   Past Medical History  Diagnosis Date  . Migraine, menstrual     Past Surgical History  Procedure Laterality Date  . Tubal ligation    . Endometrial ablation  08  . Cesarean section      x1  . Keloid excision      ears    History   Social History  . Marital Status: Married    Spouse Name: N/A  . Number of Children: 1  . Years of Education: N/A   Occupational History  . pre-K coach for [employer]   Social History Main Topics  . Smoking status: Never Smoker   . Smokeless tobacco: Never Used  . Alcohol Use: Yes     Comment: a glass of wine every 3-6 months.  . Drug Use: No  . Sexual Activity:    Partners: Male    Birth Control/ Protection: Surgical   Other Topics Concern  . Not on file   Social History Narrative   Lives with husband, daughter, 1 dog.  No smoke exposure    Family History  Problem Relation Age of Onset  . Hypertension Father   . Migraines Sister   . Diabetes Maternal Grandmother   . Hypertension Mother   . Cancer Maternal Grandfather     prostate cancer (65)    Outpatient Encounter Prescriptions as of 08/14/2014  Medication  Sig Note  . aspirin-acetaminophen-caffeine (EXCEDRIN MIGRAINE) 250-250-65 MG per tablet Take 2 tablets by mouth as needed.  07/25/2013: Takes once a month, around her cycle for a migraine.  Keeps her awake at night, so sometimes will avoid  . Flaxseed, Linseed, (FLAX SEEDS) POWD Take 15 mLs by mouth daily. 11/22/2013: In smoothies  . Multiple Vitamins-Minerals (MULTIVITAMIN WITH MINERALS) tablet Take 1 tablet by mouth daily.   . vitamin B-12 (CYANOCOBALAMIN) 500 MCG tablet Take 500 mcg by mouth daily. 08/14/2014: .  Marland Kitchen SUMAtriptan (IMITREX) 100 MG tablet Take 1 tablet (100 mg total) by mouth once. May repeat in 2 hours if headache persists or recurs. Max /24 hrs   . [DISCONTINUED]  naproxen (NAPROSYN) 500 MG tablet TAKE 1 TABLET BY MOUTH AS NEEDED (Patient not taking: Reported on 08/14/2014) 11/22/2013: Uses prn headaches, infrequent  . [DISCONTINUED] SUMAtriptan (IMITREX) 50 MG tablet TAKE 1 TABLET BY MOUTH AT ONSET OF MIGRAINE, MAY REPEAT IN 2 HOURS IF HEADACHE PERSISTS OR RECURES 2 TABLETS IN 24 HOURS (Patient not taking: Reported on 08/14/2014) 08/14/2014: Has been out for a while  . [DISCONTINUED] topiramate (TOPAMAX) 50 MG tablet Take 1 tablet (50 mg total) by mouth at bedtime. (Patient not taking: Reported on 08/14/2014) 08/14/2014: Stopped taking last summer    No Known Allergies  ROS: The patient denies anorexia, fever, decreased hearing, ear pain, sore throat, breast concerns, chest pain, palpitations, dizziness, syncope, dyspnea on exertion, cough, swelling, nausea, vomiting, diarrhea, constipation, abdominal pain, melena, hematochezia, indigestion/heartburn, hematuria, incontinence, dysuria, irregular menstrual cycles, vaginal discharge, odor or itch, genital lesions, joint pains, numbness, tingling, weakness, tremor, suspicious skin lesions, depression, anxiety, abnormal bleeding/bruising, or enlarged lymph nodes. Migraine headaches as per HPI.  No other headaches. She has lost 12# since her last physical (She previously got down to 180 4-5 years ago with [employer].) Has reading glasses Mild chronic constipation, unchanged.  No hair/skin/bowel/mood changes.   PHYSICAL EXAM:  BP 120/70 mmHg  Pulse 68  Ht 5' 5.5" (1.664 m)  Wt 228 lb 9.6 oz (103.692 kg)  BMI 37.45 kg/m2  LMP 08/02/2014    General Appearance:   Alert, cooperative, no distress, appears stated age  Head:   Normocephalic, without obvious abnormality, atraumatic  Eyes:   PERRL, conjunctiva/corneas clear, EOM's intact, fundi   benign  Ears:   Normal TM's and external ear canals  Nose:  Nares normal, mucosa normal, no drainage or sinus tenderness  Throat:  Lips, mucosa,  and tongue normal; teeth and gums normal  Neck:  Supple, no lymphadenopathy; thyroid: no enlargement/tenderness/nodules; no carotid  bruit or JVD  Back:  Spine nontender, no curvature, ROM normal, no CVA tenderness  Lungs:   Clear to auscultation bilaterally without wheezes, rales or ronchi; respirations unlabored  Chest Wall:   No tenderness or deformity  Heart:   Regular rate and rhythm, S1 and S2 normal, no murmur, rub  or gallop  Breast Exam:   No tenderness, masses, or nipple discharge or inversion. No axillary lymphadenopathy  Abdomen:   Soft, non-tender, nondistended, normoactive bowel sounds,   no masses, no hepatosplenomegaly  Genitalia:   Normal external genitalia without lesions. BUS and vagina normal; no cervical motion tenderness. No abnormal vaginal discharge. Uterus and adnexa not enlarged, nontender, no masses. Pap not performed  Rectal:   Normal tone, no masses or tenderness; guaiac negative stool  Extremities:  No clubbing, cyanosis or edema  Pulses:  2+ and symmetric all extremities  Skin:  Skin color, texture, turgor  normal, no rashes or lesions  Lymph nodes:  Cervical, supraclavicular, and axillary nodes normal  Neurologic:  CNII-XII intact, normal strength, sensation and gait; reflexes 2+ and symmetric throughout   Psych: Normal mood, affect, hygiene and grooming        ASSESSMENT/PLAN:  Annual physical exam - Plan: Visual acuity screening, POCT Urinalysis Dipstick, CBC with Differential/Platelet, Lipid panel, Vit D  25 hydroxy (rtn osteoporosis monitoring), Glucose, random  Vitamin D deficiency - mild in past, due for recheck. OTC supplementation reviewed - Plan: Vit D  25 hydroxy (rtn osteoporosis monitoring)  Menorrhagia with regular cycle - some recurrent heavy cycles, overall still lighter than prior to ablation - Plan: CBC with Differential/Platelet  Elevated  triglycerides with high cholesterol - mildly elevated in past.  lowfat diet reviewed. - Plan: Lipid panel  Menstrual migraine without status migrainosus, not intractable - try higher dose of imitrex prn; may continue to use Excedrin prn.  return to discuss other options if increasing in frequency - Plan: SUMAtriptan (IMITREX) 100 MG tablet  Obesity (BMI 35.0-39.9 without comorbidity) - counseled re: diet (healthy diet, portions, snacks) and exercise  Discussed monthly self breast exams and yearly mammograms (discussed q1 yr vs q2 yr, 3D recommended); at least 30 minutes of aerobic activity at least 5 days/week, weight-bearing exercise 2-3x/wk; proper sunscreen use reviewed; healthy diet, including goals of calcium and vitamin D intake and alcohol recommendations (less than or equal to 1 drink/day) reviewed; regular seatbelt use; changing batteries in smoke detectors. Immunization recommendations discussed--flu shots recommended yearly. Colonoscopy recommendations reviewed, age 32  Mildly elevated TG.  Originally suggested to recheck lipids next year (and continue to try and follow lowfat diet)--but she requires yearly labs for insurance discount.  Will return fasting.  Vitamin D deficiency--recheck.  Never started add'l OTC vitamin D.  Discussed 1000 IU daily in addition to MVI, but may change depending on current level.  F/u for fasting labs; CPE 1 year, sooner prn

## 2014-08-14 NOTE — Patient Instructions (Signed)

## 2014-08-15 ENCOUNTER — Other Ambulatory Visit: Payer: BC Managed Care – PPO

## 2014-08-15 DIAGNOSIS — Z0000 Encounter for general adult medical examination without abnormal findings: Secondary | ICD-10-CM

## 2014-08-15 DIAGNOSIS — N920 Excessive and frequent menstruation with regular cycle: Secondary | ICD-10-CM

## 2014-08-15 DIAGNOSIS — E559 Vitamin D deficiency, unspecified: Secondary | ICD-10-CM

## 2014-08-15 DIAGNOSIS — E782 Mixed hyperlipidemia: Secondary | ICD-10-CM

## 2014-08-15 LAB — CBC WITH DIFFERENTIAL/PLATELET
Basophils Absolute: 0 10*3/uL (ref 0.0–0.1)
Basophils Relative: 0 % (ref 0–1)
EOS ABS: 0.1 10*3/uL (ref 0.0–0.7)
EOS PCT: 2 % (ref 0–5)
HEMATOCRIT: 35.6 % — AB (ref 36.0–46.0)
HEMOGLOBIN: 11.7 g/dL — AB (ref 12.0–15.0)
LYMPHS ABS: 1.4 10*3/uL (ref 0.7–4.0)
LYMPHS PCT: 20 % (ref 12–46)
MCH: 30.5 pg (ref 26.0–34.0)
MCHC: 32.9 g/dL (ref 30.0–36.0)
MCV: 93 fL (ref 78.0–100.0)
MONOS PCT: 7 % (ref 3–12)
MPV: 10.7 fL (ref 8.6–12.4)
Monocytes Absolute: 0.5 10*3/uL (ref 0.1–1.0)
Neutro Abs: 5.1 10*3/uL (ref 1.7–7.7)
Neutrophils Relative %: 71 % (ref 43–77)
Platelets: 263 10*3/uL (ref 150–400)
RBC: 3.83 MIL/uL — ABNORMAL LOW (ref 3.87–5.11)
RDW: 14.2 % (ref 11.5–15.5)
WBC: 7.2 10*3/uL (ref 4.0–10.5)

## 2014-08-16 LAB — LIPID PANEL
Cholesterol: 203 mg/dL — ABNORMAL HIGH (ref 0–200)
HDL: 58 mg/dL (ref >=46)
LDL Cholesterol: 131 mg/dL — ABNORMAL HIGH (ref 0–99)
TRIGLYCERIDES: 68 mg/dL (ref <150)
Total CHOL/HDL Ratio: 3.5 Ratio
VLDL: 14 mg/dL (ref 0–40)

## 2014-08-16 LAB — VITAMIN D 25 HYDROXY (VIT D DEFICIENCY, FRACTURES): Vit D, 25-Hydroxy: 17 ng/mL — ABNORMAL LOW (ref 30–100)

## 2014-08-16 LAB — GLUCOSE, RANDOM: Glucose, Bld: 77 mg/dL (ref 70–99)

## 2014-08-19 ENCOUNTER — Other Ambulatory Visit: Payer: Self-pay | Admitting: *Deleted

## 2014-08-19 MED ORDER — ERGOCALCIFEROL 1.25 MG (50000 UT) PO CAPS: 50000.0000 [IU] | ORAL_CAPSULE | ORAL | Status: DC

## 2014-08-26 ENCOUNTER — Other Ambulatory Visit: Payer: Self-pay

## 2014-08-26 DIAGNOSIS — Z1231 Encounter for screening mammogram for malignant neoplasm of breast: Secondary | ICD-10-CM

## 2014-09-02 ENCOUNTER — Ambulatory Visit
Admission: RE | Admit: 2014-09-02 | Discharge: 2014-09-02 | Disposition: A | Discharge status: Home / Self Care | Payer: BC Managed Care – PPO | Source: Ambulatory Visit

## 2014-09-02 DIAGNOSIS — Z1231 Encounter for screening mammogram for malignant neoplasm of breast: Secondary | ICD-10-CM

## 2014-09-16 ENCOUNTER — Telehealth: Payer: Self-pay | Admitting: Family Medicine

## 2014-09-16 NOTE — Telephone Encounter (Signed)
Left message for pt to call. Pt needs to reschedule appt for 04.24.2017.

## 2014-09-19 ENCOUNTER — Telehealth: Payer: Self-pay | Admitting: Family Medicine

## 2014-09-19 NOTE — Telephone Encounter (Signed)
Left second message for pt to call. Need to change 08/18/2015 appt.  °

## 2015-01-10 ENCOUNTER — Other Ambulatory Visit: Payer: Self-pay | Admitting: Family Medicine

## 2015-01-13 NOTE — Telephone Encounter (Signed)
Is this okay to refill? 

## 2015-01-13 NOTE — Telephone Encounter (Signed)
Per last visit, she hadn't been taking topamax (last prescribed 10/2013).  Okay to refill #9 of imitrex, no refill. Schedule med check on migraine to discuss other.

## 2015-03-26 ENCOUNTER — Other Ambulatory Visit: Payer: Self-pay | Admitting: Family Medicine

## 2015-03-26 NOTE — Telephone Encounter (Signed)
Is this okay?

## 2015-06-17 ENCOUNTER — Other Ambulatory Visit: Payer: Self-pay | Admitting: Family Medicine

## 2015-06-17 NOTE — Telephone Encounter (Signed)
Is this ok to refill?  

## 2015-08-11 ENCOUNTER — Telehealth: Payer: Self-pay | Admitting: Family Medicine

## 2015-08-11 ENCOUNTER — Other Ambulatory Visit: Payer: Self-pay

## 2015-08-11 ENCOUNTER — Other Ambulatory Visit: Payer: Self-pay | Admitting: Family Medicine

## 2015-08-11 DIAGNOSIS — Z1231 Encounter for screening mammogram for malignant neoplasm of breast: Secondary | ICD-10-CM

## 2015-08-11 NOTE — Telephone Encounter (Signed)
Pt has cpe in may needs imitrex refilled

## 2015-08-11 NOTE — Telephone Encounter (Signed)
Last filled 2/21. Has appt scheduled in May. Okay--done

## 2015-08-11 NOTE — Telephone Encounter (Signed)
Is this okay to refill? 

## 2015-08-11 NOTE — Telephone Encounter (Signed)
rx refill came in and I already sent to Dr.Knapp.

## 2015-08-18 ENCOUNTER — Encounter: Payer: BC Managed Care – PPO | Admitting: Family Medicine

## 2015-09-03 ENCOUNTER — Ambulatory Visit: Payer: BC Managed Care – PPO

## 2015-09-08 ENCOUNTER — Ambulatory Visit
Admission: RE | Admit: 2015-09-08 | Discharge: 2015-09-08 | Disposition: A | Discharge status: Home / Self Care | Payer: BC Managed Care – PPO | Source: Ambulatory Visit

## 2015-09-08 DIAGNOSIS — Z1231 Encounter for screening mammogram for malignant neoplasm of breast: Secondary | ICD-10-CM

## 2015-09-18 ENCOUNTER — Encounter: Payer: Self-pay | Admitting: Family Medicine

## 2015-09-18 ENCOUNTER — Ambulatory Visit (INDEPENDENT_AMBULATORY_CARE_PROVIDER_SITE_OTHER): Payer: BC Managed Care – PPO | Admitting: Family Medicine

## 2015-09-18 VITALS — BP 120/82 | HR 60 | Ht 66.0 in | Wt 235.0 lb

## 2015-09-18 DIAGNOSIS — R5383 Other fatigue: Secondary | ICD-10-CM

## 2015-09-18 DIAGNOSIS — E7800 Pure hypercholesterolemia, unspecified: Secondary | ICD-10-CM | POA: Diagnosis not present

## 2015-09-18 DIAGNOSIS — E669 Obesity, unspecified: Secondary | ICD-10-CM | POA: Diagnosis not present

## 2015-09-18 DIAGNOSIS — Z0000 Encounter for general adult medical examination without abnormal findings: Secondary | ICD-10-CM | POA: Diagnosis not present

## 2015-09-18 DIAGNOSIS — N943 Premenstrual tension syndrome: Secondary | ICD-10-CM

## 2015-09-18 DIAGNOSIS — G43829 Menstrual migraine, not intractable, without status migrainosus: Secondary | ICD-10-CM

## 2015-09-18 DIAGNOSIS — E559 Vitamin D deficiency, unspecified: Secondary | ICD-10-CM

## 2015-09-18 DIAGNOSIS — R635 Abnormal weight gain: Secondary | ICD-10-CM

## 2015-09-18 LAB — CBC WITH DIFFERENTIAL/PLATELET
BASOS ABS: 0 {cells}/uL (ref 0–200)
Basophils Relative: 0 %
EOS PCT: 4 %
Eosinophils Absolute: 252 cells/uL (ref 15–500)
HCT: 37.7 % (ref 35.0–45.0)
HEMOGLOBIN: 12.5 g/dL (ref 11.7–15.5)
Lymphocytes Relative: 26 %
Lymphs Abs: 1638 cells/uL (ref 850–3900)
MCH: 30.9 pg (ref 27.0–33.0)
MCHC: 33.2 g/dL (ref 32.0–36.0)
MCV: 93.1 fL (ref 80.0–100.0)
MPV: 10.3 fL (ref 7.5–12.5)
Monocytes Absolute: 441 cells/uL (ref 200–950)
Monocytes Relative: 7 %
NEUTROS PCT: 63 %
Neutro Abs: 3969 cells/uL (ref 1500–7800)
Platelets: 259 10*3/uL (ref 140–400)
RBC: 4.05 MIL/uL (ref 3.80–5.10)
RDW: 13.5 % (ref 11.0–15.0)
WBC: 6.3 10*3/uL (ref 4.0–10.5)

## 2015-09-18 LAB — COMPREHENSIVE METABOLIC PANEL
ALK PHOS: 66 U/L (ref 33–115)
ALT: 9 U/L (ref 6–29)
AST: 15 U/L (ref 10–35)
Albumin: 4 g/dL (ref 3.6–5.1)
BILIRUBIN TOTAL: 0.4 mg/dL (ref 0.2–1.2)
BUN: 12 mg/dL (ref 7–25)
CO2: 26 mmol/L (ref 20–31)
CREATININE: 0.7 mg/dL (ref 0.50–1.10)
Calcium: 9.4 mg/dL (ref 8.6–10.2)
Chloride: 103 mmol/L (ref 98–110)
GLUCOSE: 82 mg/dL (ref 65–99)
Potassium: 4.1 mmol/L (ref 3.5–5.3)
SODIUM: 139 mmol/L (ref 135–146)
Total Protein: 7.2 g/dL (ref 6.1–8.1)

## 2015-09-18 LAB — POCT URINALYSIS DIPSTICK
BILIRUBIN UA: NEGATIVE
GLUCOSE UA: NEGATIVE
KETONES UA: NEGATIVE
Leukocytes, UA: NEGATIVE
Nitrite, UA: NEGATIVE
Protein, UA: NEGATIVE
Spec Grav, UA: >=1.03
Urobilinogen, UA: NEGATIVE
pH, UA: 6

## 2015-09-18 LAB — LIPID PANEL
Cholesterol: 197 mg/dL (ref 125–200)
HDL: 74 mg/dL (ref >=46)
LDL CALC: 106 mg/dL (ref <130)
Total CHOL/HDL Ratio: 2.7 Ratio (ref <=5.0)
Triglycerides: 86 mg/dL (ref <150)
VLDL: 17 mg/dL (ref <30)

## 2015-09-18 LAB — TSH: TSH: 1.35 mIU/L

## 2015-09-18 MED ORDER — SUMATRIPTAN SUCCINATE 100 MG PO TABS: ORAL_TABLET | ORAL | Status: DC

## 2015-09-18 NOTE — Patient Instructions (Signed)
  HEALTH MAINTENANCE RECOMMENDATIONS:  It is recommended that you get at least 30 minutes of aerobic exercise at least 5 days/week (for weight loss, you may need as much as 60-90 minutes). This can be any activity that gets your heart rate up. This can be divided in 10-15 minute intervals if needed, but try and build up your endurance at least once a week.  Weight bearing exercise is also recommended twice weekly.  Eat a healthy diet with lots of vegetables, fruits and fiber.  "Colorful" foods have a lot of vitamins (ie green vegetables, tomatoes, red peppers, etc).  Limit sweet tea, regular sodas and alcoholic beverages, all of which has a lot of calories and sugar.  Up to 1 alcoholic drink daily may be beneficial for women (unless trying to lose weight, watch sugars).  Drink a lot of water.  Calcium recommendations are 1200-1500 mg daily (1500 mg for postmenopausal women or women without ovaries), and vitamin D 1000 IU daily.  This should be obtained from diet and/or supplements (vitamins), and calcium should not be taken all at once, but in divided doses.  Monthly self breast exams and yearly mammograms for women over the age of 48 is recommended.  Sunscreen of at least SPF 30 should be used on all sun-exposed parts of the skin when outside between the hours of 10 am and 4 pm (not just when at beach or pool, but even with exercise, golf, tennis, and yard work!)  Use a sunscreen that says "broad spectrum" so it covers both UVA and UVB rays, and make sure to reapply every 1-2 hours.  Remember to change the batteries in your smoke detectors when changing your clock times in the spring and fall.  Use your seat belt every time you are in a car, and please drive safely and not be distracted with cell phones and texting while driving.  Estroven (soy, black cohosh) can be helpful in treating hot flashes.

## 2015-09-18 NOTE — Progress Notes (Signed)
Chief Complaint  Patient presents with  . Annual Exam    fasting annual exam with pap. UA showed 2+ blood, no symptoms. No concerns.   Anita Taylor is a 50 y.o. female who presents for a complete physical.  She has the following concerns:  Migraines:  She continues to have monthly migraines, with her cycles, but since using imitrex (rather than Excedrin Migraine) they don't seem to last as long (no longer lasting for 3 days).  If she runs out of imitrex, sometimes will still try Excedrin, but usually doesn't help. Usually requires 2 imitrex the first day of the headache, and often a third the following morning.  Previously tried Topamax for migraine prevention (see July 2015 visit for more detailed history) which caused a lingering, dull headache  Vitamin D deficiency: Admits to being out of her OTC supplement for about 2 months. She continues to take a daily MVI. Previously required prescription replacement; last year level was 17.  Due for recheck.  Periods have recurred since having her ablation. They are heavier than they had been initially after recurring, lasting 3-4 days, gradually getting heavier (now noticing some clots), and now starting to be a little irregular.  She is due for her cycle this week, hasn't yet noticed any spotting. She is starting to have hot flashes at night, some mild sweats.  Borderline hyperlipidemia:  She is trying to follow a low cholesterol diet, but admits to having some fried foods. She plans to restart [employer] next week  Immunization History  Administered Date(s) Administered  . Influenza Split 12/29/2011  . PPD Test 01/19/2012  . Tdap 01/19/2012   didn't get flu shot this past year Last Pap smear: 07/25/2013--normal, no high risk HPV present Last mammogram: 08/2015  Last colonoscopy: never Last DEXA: never  Dentist: twice yearly Ophtho: scheduled for this summer Exercise: Joined gym 2 weeks ago; goes to [employer] 3-4 times/week over  lunch--walk on the track x 30-45 minutes.  No weight-bearing activity. 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   Lipids: Lab Results  Component Value Date   CHOL 203* 08/15/2014   HDL 58 08/15/2014   LDLCALC 131* 08/15/2014   TRIG 68 08/15/2014   CHOLHDL 3.5 08/15/2014   Past Medical History  Diagnosis Date  . Migraine, menstrual   . Vitamin D deficiency     Past Surgical History  Procedure Laterality Date  . Tubal ligation    . Endometrial ablation  08  . Cesarean section      x1  . Keloid excision      ears    Social History   Social History  . Marital Status: Married    Spouse Name: N/A  . Number of Children: 1  . Years of Education: N/A   Occupational History  . pre-K coach for [employer] History Main Topics  . Smoking status: Never Smoker   . Smokeless tobacco: Never Used  . Alcohol Use: Yes     Comment: a glass of wine every 3-6 months.  . Drug Use: No  . Sexual Activity:    Partners: Male    Birth Control/ Protection: Surgical   Other Topics Concern  . Not on file   Social History Narrative   Lives with husband, daughter, 1 dog.  No smoke exposure    Family History  Problem Relation Age of Onset  . Hypertension  Father   . Migraines Sister   . Diabetes Maternal Grandmother   . Hypertension Mother   . Cancer Maternal Grandfather     prostate cancer (65)    Outpatient Encounter Prescriptions as of 09/18/2015  Medication Sig Note  . Multiple Vitamins-Minerals (MULTIVITAMIN WITH MINERALS) tablet Take 1 tablet by mouth daily.   . vitamin B-12 (CYANOCOBALAMIN) 500 MCG tablet Take 500 mcg by mouth daily. 08/14/2014: .  . aspirin-acetaminophen-caffeine (EXCEDRIN MIGRAINE) 250-250-65 MG per tablet Take 2 tablets by mouth as needed. Reported on 09/18/2015 09/18/2015: Uses monthly with menstrual migraines if she is out of the imitrex  . Cholecalciferol (VITAMIN D3 PO)  Take 500 Int'l Units by mouth daily. Reported on 09/18/2015 09/18/2015: Hasn't taken any vitamin D for over 2 months  . SUMAtriptan (IMITREX) 100 MG tablet TAKE 1 TABLET BY MOUTH ONE TIME. MAY REPEAT IN 2 HOURS IF HEADACHE PERSISTS OR RECURS. MAX 2 TABLETS PER 24 HOURS   . [DISCONTINUED] ergocalciferol (VITAMIN D2) 50000 UNITS capsule Take 1 capsule (50,000 Units total) by mouth once a week.   . [DISCONTINUED] Flaxseed, Linseed, (FLAX SEEDS) POWD Take 15 mLs by mouth daily. Reported on 09/18/2015 11/22/2013: In smoothies  . [DISCONTINUED] SUMAtriptan (IMITREX) 100 MG tablet TAKE 1 TABLET BY MOUTH ONE TIME. MAY REPEAT IN 2 HOURS IF HEADACHE PERSISTS OR RECURS. MAX 2 TABLETS PER 24 HOURS (Patient not taking: Reported on 09/18/2015) 09/18/2015: Uses monthly with migraines    No facility-administered encounter medications on file as of 09/18/2015.    No Known Allergies  ROS: The patient denies anorexia, fever, decreased hearing, ear pain, sore throat, breast concerns, chest pain, palpitations, dizziness, syncope, dyspnea on exertion, cough, swelling, nausea, vomiting, diarrhea, constipation, abdominal pain, melena, hematochezia, indigestion/heartburn, hematuria, incontinence, dysuria, irregular menstrual cycles, vaginal discharge, odor or itch, genital lesions, joint pains, numbness, tingling, weakness, tremor, suspicious skin lesions, depression, anxiety, abnormal bleeding/bruising, or enlarged lymph nodes. Migraine headaches as per HPI. No other headaches. Has reading glasses Mild chronic constipation, unchanged. No hair/skin/bowel/mood changes. Regained the weight she lost--up 7# in the last year.  PHYSICAL EXAM:  BP 120/82 mmHg  Pulse 60  Ht _0  (1.676 m)  Wt 235 lb (106.595 kg)  BMI 37.95 kg/m2  LMP 08/19/2015  General Appearance:   Alert, cooperative, no distress, appears stated age  Head:   Normocephalic, without obvious abnormality, atraumatic  Eyes:   PERRL,  conjunctiva/corneas clear, EOM's intact, fundi   benign  Ears:   Normal TM's and external ear canals  Nose:  Nares normal, mucosa normal, no drainage or sinus tenderness  Throat:  Lips, mucosa, and tongue normal; teeth and gums normal  Neck:  Supple, no lymphadenopathy; thyroid: no enlargement/tenderness/nodules; no carotid  bruit or JVD  Back:  Spine nontender, no curvature, ROM normal, no CVA tenderness  Lungs:   Clear to auscultation bilaterally without wheezes, rales or ronchi; respirations unlabored  Chest Wall:   No tenderness or deformity  Heart:   Regular rate and rhythm, S1 and S2 normal, no murmur, rub  or gallop  Breast Exam:   No tenderness, masses, or nipple discharge or inversion. No axillary lymphadenopathy  Abdomen:   Soft, non-tender, nondistended, normoactive bowel sounds,   no masses, no hepatosplenomegaly  Genitalia:   Normal external genitalia without lesions. BUS and vagina normal; no cervical motion tenderness. No abnormal vaginal discharge. Uterus and adnexa not enlarged, nontender, no masses. Pap not performed  Rectal:   Normal tone, no masses or tenderness; guaiac negative  stool  Extremities:  No clubbing, cyanosis or edema  Pulses:  2+ and symmetric all extremities  Skin:  Skin color, texture, turgor normal, no rashes or lesions  Lymph nodes:  Cervical, supraclavicular, and axillary nodes normal  Neurologic:  CNII-XII intact, normal strength, sensation and gait; reflexes 2+ and symmetric throughout   Psych: Normal mood, affect, hygiene and grooming              Urine dip 2+ blood   ASSESSMENT/PLAN:  Annual physical exam - Plan: POCT Urinalysis Dipstick, Visual acuity screening, Lipid panel, Comprehensive metabolic panel, CBC with Differential/Platelet, VITAMIN D 25 Hydroxy (Vit-D Deficiency, Fractures),  TSH  Menstrual migraine without status migrainosus, not intractable - Plan: SUMAtriptan (IMITREX) 100 MG tablet  Obesity (BMI 35.0-39.9 without comorbidity) (HCC)  Weight gain - Plan: TSH  Other fatigue - Plan: Comprehensive metabolic panel, CBC with Differential/Platelet, VITAMIN D 25 Hydroxy (Vit-D Deficiency, Fractures), TSH  Pure hypercholesterolemia - Plan: Lipid panel  Vitamin D deficiency - Plan: VITAMIN D 25 Hydroxy (Vit-D Deficiency, Fractures)   CBC, TSH, Vit D, c-met lipid  Discussed monthly self breast exams and yearly mammograms; at least 30 minutes of aerobic activity at least 5 days/week, weight-bearing exercise 2-3x/wk; proper sunscreen use reviewed; healthy diet, including goals of calcium and vitamin D intake and alcohol recommendations (less than or equal to 1 drink/day) reviewed; regular seatbelt use; changing batteries in smoke detectors. Immunization recommendations discussed--flu shots recommended yearly. Colonoscopy recommendations reviewed, age 53.  Estroven prn menopausal symptoms.

## 2015-09-19 LAB — VITAMIN D 25 HYDROXY (VIT D DEFICIENCY, FRACTURES): Vit D, 25-Hydroxy: 26 ng/mL — ABNORMAL LOW (ref 30–100)

## 2015-11-03 ENCOUNTER — Telehealth: Payer: Self-pay

## 2015-11-03 MED ORDER — NAPROXEN 500 MG PO TABS: 500.0000 mg | ORAL_TABLET | Freq: Two times a day (BID) | ORAL | Status: DC

## 2015-11-03 NOTE — Telephone Encounter (Signed)
I don't actually have this documented at all during her visit, and the naproxen wasn't even on her med list. I have previously prescribed this for her for prn use for migraines.  Okay to refill, but ensure that this is what it is for, and that she knows not to use this along with other NSAIDs or the Excedrin migraine that she also uses prn.

## 2015-11-03 NOTE — Telephone Encounter (Signed)
Left message for patient to return my call.

## 2015-11-03 NOTE — Telephone Encounter (Signed)
Pt states that she spoke with you at her CPE for refilling her naproxen. Please call to [facility] on [address]. Thank you.

## 2015-11-03 NOTE — Telephone Encounter (Signed)
Yes, #60, no refill

## 2015-11-03 NOTE — Telephone Encounter (Signed)
Spoke with patient as yes, she uses this for migraines. Advised not to take any other NSAIDS along with this medication. In her history it was rx'd in the past for 500mg  #60-is this what you would like me to do?

## 2015-11-25 ENCOUNTER — Telehealth: Payer: Self-pay

## 2015-11-25 NOTE — Telephone Encounter (Signed)
Spoke with Anita Taylor- she states she is not taking the naproxen with diclofenac and that the other prescriber made her aware of this.

## 2015-11-25 NOTE — Telephone Encounter (Signed)
[facility] rcvd on pt in regards to having scripts for diclofenac and naproxen. Per Dr. Lynelle Doctor- the diclofenac was prescribed by another provider.  Per Dr. Lynelle Doctor- pt is not to take both medications together.   LM for pt to CB.

## 2016-01-26 ENCOUNTER — Telehealth: Payer: Self-pay | Admitting: *Deleted

## 2016-01-26 NOTE — Telephone Encounter (Signed)
Appointment is appropriate

## 2016-01-26 NOTE — Telephone Encounter (Signed)
Patient called and wants to know if you can rx something to help her to sleep. She has been having trouble for the last two weeks trying to get to sleep. Has tried TransMontaigneEstroven, benadryl-not working. Going to sleep and waking a few hours later. Had not tried anything rx in the past. I recommended that she schedule an appt-she went ahead and scheduled for Wed @ 11:45am just wanted to make sure that was what she should do. I told her I would call her back.

## 2016-01-26 NOTE — Telephone Encounter (Signed)
Patient advised.

## 2016-01-28 ENCOUNTER — Encounter: Payer: Self-pay | Admitting: Family Medicine

## 2016-01-28 ENCOUNTER — Ambulatory Visit (INDEPENDENT_AMBULATORY_CARE_PROVIDER_SITE_OTHER): Payer: BC Managed Care – PPO | Admitting: Family Medicine

## 2016-01-28 VITALS — BP 112/74 | HR 64 | Ht 66.0 in | Wt 242.8 lb

## 2016-01-28 DIAGNOSIS — N951 Menopausal and female climacteric states: Secondary | ICD-10-CM | POA: Diagnosis not present

## 2016-01-28 DIAGNOSIS — F5101 Primary insomnia: Secondary | ICD-10-CM | POA: Diagnosis not present

## 2016-01-28 DIAGNOSIS — G43829 Menstrual migraine, not intractable, without status migrainosus: Secondary | ICD-10-CM

## 2016-01-28 DIAGNOSIS — Z23 Encounter for immunization: Secondary | ICD-10-CM | POA: Diagnosis not present

## 2016-01-28 MED ORDER — AMITRIPTYLINE HCL 10 MG PO TABS: ORAL_TABLET | ORAL | 0 refills | Status: DC

## 2016-01-28 MED ORDER — ZOLPIDEM TARTRATE 10 MG PO TABS: 5.0000 mg | ORAL_TABLET | Freq: Every evening | ORAL | 0 refills | Status: DC | PRN

## 2016-01-28 NOTE — Progress Notes (Signed)
Chief Complaint  Patient presents with  . Insomnia    here to discuss sleep issues, thinks may be hormone related. Have hot flashes at night. Waking up around 2am and cannot get back to sleep. Also cannot fall asleep. Also has some stress in her life.    Over the last 5 weeks she started having problems sleeping.  Caffeine affects her, so she has been avoiding it.  At first she had trouble falling asleep (going to bed at 9:30-45); fell asleep fine when she went to bed later (10:30p), but would wake up at 2:30-3a and was unable to fall back asleep.   She was waking up from hot flashes initially, not night sweats. She couldn't get back to sleep after. These resolved with the Estroven (see below), but still having early morning awakening. No alcohol, no caffeine.  She has tried Animatorstroven for about a month--thinks the sweats are less, and she no longer has hot flashes, but still waking up and can't get back to sleep.   She tried melatonin and another sleep aid (with diphenhydramine).  Neither helped; took them each for 2 weeks (separately), without benefit. At 50mg , it helped her fall asleep, but still didn't stay asleep.  +anxiety related to her job (runs through what she needs to do in her head at night). Denies depression.  Joined [employer] and is walking in the evenings at least 3x/week, at 6:30-7:30.  She has migraines related to her cycle, about 2-3/month around her cycles.   PMH, PSH, SH reviewed  Current Outpatient Prescriptions on File Prior to Visit  Medication Sig Dispense Refill  . Multiple Vitamins-Minerals (MULTIVITAMIN WITH MINERALS) tablet Take 1 tablet by mouth daily.    . vitamin B-12 (CYANOCOBALAMIN) 500 MCG tablet Take 500 mcg by mouth daily.    . SUMAtriptan (IMITREX) 100 MG tablet TAKE 1 TABLET BY MOUTH ONE TIME. MAY REPEAT IN 2 HOURS IF HEADACHE PERSISTS OR RECURS. MAX 2 TABLETS PER 24 HOURS (Patient not taking: Reported on 01/28/2016) 9 tablet 4   No current  facility-administered medications on file prior to visit.    No Known Allergies  ROS: no fever, chills, URI symptoms, chest pain, depression, or other concerns except as stated in HPI.  +hot flashes, insomnia, some mild stress  PHYSICAL EXAM:  BP 112/74 (BP Location: Left Arm, Patient Position: Sitting, Cuff Size: Normal)   Pulse 64   Ht 5\' 6"  (1.676 m)   Wt 242 lb 12.8 oz (110.1 kg)   LMP 01/09/2016   BMI 39.19 kg/m   Well developed, pleasant female in no distress Normal mood, affect, hygiene and grooming Alert and oriented, cranial nerves grossly intact, normal gait.  ASSESSMENT/PLAN:  Primary insomnia - Plan: zolpidem (AMBIEN) 10 MG tablet, amitriptyline (ELAVIL) 10 MG tablet  Need for prophylactic vaccination and inoculation against influenza - Plan: Flu Vaccine QUAD 36+ mos PF IM (Fluarix & Fluzone Quad PF)  Menopausal symptoms  Menstrual migraine without status migrainosus, not intractable - Plan: amitriptyline (ELAVIL) 10 MG tablet   Discussed sleep hygiene, relaxation techniques.  Continue to avoid alcohol, caffeine. Ensure that exercise is at least 3 hours prior to bedtime.  Discussed ambien (plain and CR), Lunesta, Belsomra, Trazadone and amitriptyline and nortriptyline.  Will give rx's for Ambien 10mg  as well as amitryptilene. Will start the ambien, and if needing chronically, switch over and try the elavil.  25-30 min visit, more than 1/2 spent counseling on sleep hygiene, relaxation techniques, and various medication treatments, risks, side  effects.   Please read the info below regarding sleep hygiene. Work on relaxation techniques to clear the mind, to help you sleep better.  Consider trying a tea such as Sleepytime.  Take ambien at bedtime (make sure you have at least 8 hours before waking/driving).  Start at 1/2 tablet.  If ineffective, increase to the full tablet.  Please discuss this with your husband, and stop using it if you have any unusual behavior  develop while taking this medication.  If it is helpful, but you need it every single night, then try the other medication, as this might also be helpful in preventing your migraines.  Start the amitriptyline at 1 capsule at bedtime, but increase to 2 capsules if 1 isn't effective. If you are sleeping well with it, but you are very groggy and "hung over" the next morning, then we can switch it to nortriptyline--same idea (same class of medication, also helps with migraines, but less sedating).  Start the trial of this medication on the weekend, in case you are groggy the next day.  The other alternatives that we discussed included Belsomra, Trazodone.

## 2016-01-28 NOTE — Patient Instructions (Signed)
Please read the info below regarding sleep hygiene. Work on relaxation techniques to clear the mind, to help you sleep better.  Consider trying a tea such as Sleepytime.  Take ambien at bedtime (make sure you have at least 8 hours before waking/driving).  Start at 1/2 tablet.  If ineffective, increase to the full tablet.  Please discuss this with your husband, and stop using it if you have any unusual behavior develop while taking this medication.  If it is helpful, but you need it every single night, then try the other medication, as this might also be helpful in preventing your migraines.  Start the amitriptyline at 1 capsule at bedtime, but increase to 2 capsules if 1 isn't effective. If you are sleeping well with it, but you are very groggy and "hung over" the next morning, then we can switch it to nortriptyline--same idea (same class of medication, also helps with migraines, but less sedating).  Start the trial of this medication on the weekend, in case you are groggy the next day.  The other alternatives that we discussed included Belsomra, Trazodone.   Insomnia Insomnia is a sleep disorder that makes it difficult to fall asleep or to stay asleep. Insomnia can cause tiredness (fatigue), low energy, difficulty concentrating, mood swings, and poor performance at work or school.  There are three different ways to classify insomnia:  Difficulty falling asleep.  Difficulty staying asleep.  Waking up too early in the morning. Any type of insomnia can be long-term (chronic) or short-term (acute). Both are common. Short-term insomnia usually lasts for three months or less. Chronic insomnia occurs at least three times a week for longer than three months. CAUSES  Insomnia may be caused by another condition, situation, or substance, such as:  Anxiety.  Certain medicines.  Gastroesophageal reflux disease (GERD) or other gastrointestinal conditions.  Asthma or other breathing  conditions.  Restless legs syndrome, sleep apnea, or other sleep disorders.  Chronic pain.  Menopause. This may include hot flashes.  Stroke.  Abuse of alcohol, tobacco, or illegal drugs.  Depression.  Caffeine.   Neurological disorders, such as Alzheimer disease.  An overactive thyroid (hyperthyroidism). The cause of insomnia may not be known. RISK FACTORS Risk factors for insomnia include:  Gender. Women are more commonly affected than men.  Age. Insomnia is more common as you get older.  Stress. This may involve your professional or personal life.  Income. Insomnia is more common in people with lower income.  Lack of exercise.   Irregular work schedule or night shifts.  Traveling between different time zones. SIGNS AND SYMPTOMS If you have insomnia, trouble falling asleep or trouble staying asleep is the main symptom. This may lead to other symptoms, such as:  Feeling fatigued.  Feeling nervous about going to sleep.  Not feeling rested in the morning.  Having trouble concentrating.  Feeling irritable, anxious, or depressed. TREATMENT  Treatment for insomnia depends on the cause. If your insomnia is caused by an underlying condition, treatment will focus on addressing the condition. Treatment may also include:   Medicines to help you sleep.  Counseling or therapy.  Lifestyle adjustments. HOME CARE INSTRUCTIONS   Take medicines only as directed by your health care provider.  Keep regular sleeping and waking hours. Avoid naps.  Keep a sleep diary to help you and your health care provider figure out what could be causing your insomnia. Include:   When you sleep.  When you wake up during the night.  How  well you sleep.   How rested you feel the next day.  Any side effects of medicines you are taking.  What you eat and drink.   Make your bedroom a comfortable place where it is easy to fall asleep:  Put up shades or special blackout  curtains to block light from outside.  Use a white noise machine to block noise.  Keep the temperature cool.   Exercise regularly as directed by your health care provider. Avoid exercising right before bedtime.  Use relaxation techniques to manage stress. Ask your health care provider to suggest some techniques that may work well for you. These may include:  Breathing exercises.  Routines to release muscle tension.  Visualizing peaceful scenes.  Cut back on alcohol, caffeinated beverages, and cigarettes, especially close to bedtime. These can disrupt your sleep.  Do not overeat or eat spicy foods right before bedtime. This can lead to digestive discomfort that can make it hard for you to sleep.  Limit screen use before bedtime. This includes:  Watching TV.  Using your smartphone, tablet, and computer.  Stick to a routine. This can help you fall asleep faster. Try to do a quiet activity, brush your teeth, and go to bed at the same time each night.  Get out of bed if you are still awake after 15 minutes of trying to sleep. Keep the lights down, but try reading or doing a quiet activity. When you feel sleepy, go back to bed.  Make sure that you drive carefully. Avoid driving if you feel very sleepy.  Keep all follow-up appointments as directed by your health care provider. This is important. SEEK MEDICAL CARE IF:   You are tired throughout the day or have trouble in your daily routine due to sleepiness.  You continue to have sleep problems or your sleep problems get worse. SEEK IMMEDIATE MEDICAL CARE IF:   You have serious thoughts about hurting yourself or someone else.   This information is not intended to replace advice given to you by your health care provider. Make sure you discuss any questions you have with your health care provider.   Document Released: 04/09/2000 Document Revised: 01/01/2015 Document Reviewed: 01/11/2014 Elsevier Interactive Patient Education [employer].

## 2016-02-24 ENCOUNTER — Other Ambulatory Visit: Payer: Self-pay | Admitting: Family Medicine

## 2016-02-24 DIAGNOSIS — F5101 Primary insomnia: Secondary | ICD-10-CM

## 2016-02-24 DIAGNOSIS — G43829 Menstrual migraine, not intractable, without status migrainosus: Secondary | ICD-10-CM

## 2016-02-24 NOTE — Telephone Encounter (Signed)
Left message for pt to call me back 

## 2016-02-24 NOTE — Telephone Encounter (Signed)
Please contact the patient to see if she is taking 1 or 2 each night. (For all I know, she isn't really taking it at all, and this could be an auto-refill request.  We had prescribed both zolpidem and this at her last visit).   If taking just 1, okay to refill x 3. If she is taking 2 each night, please change to 25mg  dose, and rx 1 qHS, #30 with 3 refill. Thanks

## 2016-02-24 NOTE — Telephone Encounter (Signed)
Is this okay to refill? 

## 2016-02-25 NOTE — Telephone Encounter (Signed)
Spoke to patient and she states this did not work for her, she is only taking [place]. I will deny this rx

## 2016-04-27 ENCOUNTER — Other Ambulatory Visit: Payer: Self-pay | Admitting: Family Medicine

## 2016-04-27 DIAGNOSIS — G43829 Menstrual migraine, not intractable, without status migrainosus: Secondary | ICD-10-CM

## 2016-05-25 ENCOUNTER — Ambulatory Visit (INDEPENDENT_AMBULATORY_CARE_PROVIDER_SITE_OTHER): Payer: BC Managed Care – PPO | Admitting: Medical

## 2016-05-25 VITALS — BP 120/76 | HR 85 | Temp 99.0°F | Wt 248.0 lb

## 2016-05-25 DIAGNOSIS — R6889 Other general symptoms and signs: Secondary | ICD-10-CM | POA: Diagnosis not present

## 2016-05-25 DIAGNOSIS — R509 Fever, unspecified: Secondary | ICD-10-CM | POA: Diagnosis not present

## 2016-05-25 DIAGNOSIS — R05 Cough: Secondary | ICD-10-CM

## 2016-05-25 DIAGNOSIS — R059 Cough, unspecified: Secondary | ICD-10-CM

## 2016-05-25 LAB — POC INFLUENZA A&B (BINAX/QUICKVUE)
INFLUENZA B, POC: NEGATIVE
Influenza A, POC: NEGATIVE

## 2016-05-25 MED ORDER — HYDROCODONE-HOMATROPINE 5-1.5 MG/5ML PO SYRP: 5.0000 mL | ORAL_SOLUTION | Freq: Three times a day (TID) | ORAL | 0 refills | Status: DC | PRN

## 2016-05-25 NOTE — Progress Notes (Signed)
Subjective: Chief Complaint  Patient presents with  . sick    fever, cough, sore throat- started symptoms yesterday   Here for illness.  Has 4 day hx/o cough, chest discomfort, chills, low grade fever, body aches.   No NVD, no rash, no SOB.  +sore throat, no ear pain.   No sinus pressure, no headache.   Has had sick contacts with some flu like symptoms.  Using nothing for symptoms.  No other aggravating or relieving factors. No other complaint.  Past Medical History:  Diagnosis Date  . Migraine, menstrual   . Vitamin D deficiency    Current Outpatient Prescriptions on File Prior to Visit  Medication Sig Dispense Refill  . amitriptyline (ELAVIL) 10 MG tablet Start with 1 tablet by mouth at bedtime. Increase to 2 tablets if needed 60 tablet 0  . Cholecalciferol (VITAMIN D-3) 1000 units CAPS Take 1 capsule by mouth daily.    . Multiple Vitamins-Minerals (MULTIVITAMIN WITH MINERALS) tablet Take 1 tablet by mouth daily.    . SUMAtriptan (IMITREX) 100 MG tablet TAKE 1 TABLET BY MOUTH 1 TIME. MAY REPEAT IN 2 HOURS IF HEADACHE PERSISTS OR RECURS. MAX 2 TABLETS PER 24 HOURS 9 tablet 0  . vitamin B-12 (CYANOCOBALAMIN) 500 MCG tablet Take 500 mcg by mouth daily.    Marland Kitchen. zolpidem (AMBIEN) 10 MG tablet Take 0.5-1 tablets (5-10 mg total) by mouth at bedtime as needed for sleep. 30 tablet 0   No current facility-administered medications on file prior to visit.    ROS as in subjective    Objective BP 120/76   Pulse 85   Temp 99 F (37.2 C) (Oral)   Wt 248 lb (112.5 kg)   SpO2 97%   BMI 40.03 kg/m   General appearance: alert, no distress, WD/WN, mildly ill appearing HEENT: normocephalic, sclerae anicteric, conjunctiva pink and moist, TMs pearly, nares patent, no discharge or erythema, pharynx normal, tonsils unremarkable Oral cavity: MMM, no lesions Neck: supple, no lymphadenopathy, no thyromegaly, no masses Heart: RRR, normal S1, S2, no murmurs Lungs: CTA bilaterally, no wheezes, rhonchi, or  rales Pulses: 2+ symmetric      Assessment: Encounter Diagnoses  Name Primary?  . Flu-like symptoms Yes  . Cough   . Fever, unspecified fever cause     Plan: Presumed mild flu like illness. She had flu shot this year of note.   Advised rest, hydration, fever control with OTC analgesic can use medication below for worse cough.   Limit exposure to others to avoid spread of illness.    Anita Taylor was seen today for sick.  Diagnoses and all orders for this visit:  Flu-like symptoms -     POC Influenza A&B(BINAX/QUICKVUE)  Cough  Fever, unspecified fever cause  Other orders -     HYDROcodone-homatropine (HYCODAN) 5-1.5 MG/5ML syrup; Take 5 mLs by mouth every 8 (eight) hours as needed for cough.

## 2016-06-23 ENCOUNTER — Other Ambulatory Visit: Payer: Self-pay | Admitting: Family Medicine

## 2016-06-23 DIAGNOSIS — G43829 Menstrual migraine, not intractable, without status migrainosus: Secondary | ICD-10-CM

## 2016-06-23 NOTE — Telephone Encounter (Signed)
Per last visit, she was having migraines related to her cycle, about 2-3/month around her cycles.  Is this still this case, or more often? I'm aware that she isn't taking the elavil (amitryptilene) as it wasn't effective for sleep, but it would work very well for prevention of migraines, if having frequently.    Last filled #9 almost 2 months ago (so if she had 2 cycles in this time frame, with a few headaches, might still be the same as in the past).   Okay to refill, but she should schedule f/u if any increase in frequency or severity of her migraines (if not just related to cycles).

## 2016-06-23 NOTE — Telephone Encounter (Signed)
Is this okay to refill? She has been having a lot of migraines lately and has used the nine from January.

## 2016-07-28 ENCOUNTER — Other Ambulatory Visit: Payer: Self-pay | Admitting: Family Medicine

## 2016-07-28 DIAGNOSIS — G43829 Menstrual migraine, not intractable, without status migrainosus: Secondary | ICD-10-CM

## 2016-07-29 ENCOUNTER — Ambulatory Visit (INDEPENDENT_AMBULATORY_CARE_PROVIDER_SITE_OTHER): Payer: BC Managed Care – PPO | Admitting: Family Medicine

## 2016-07-29 ENCOUNTER — Encounter: Payer: Self-pay | Admitting: Family Medicine

## 2016-07-29 DIAGNOSIS — G43829 Menstrual migraine, not intractable, without status migrainosus: Secondary | ICD-10-CM | POA: Diagnosis not present

## 2016-07-29 MED ORDER — TOPIRAMATE 25 MG PO TABS: ORAL_TABLET | ORAL | 1 refills | Status: DC

## 2016-07-29 MED ORDER — SUMATRIPTAN SUCCINATE 100 MG PO TABS: ORAL_TABLET | ORAL | 0 refills | Status: DC

## 2016-07-29 NOTE — Patient Instructions (Signed)
  If you get an early warning about a migraine, or if migraine is extremely mild (4/10 or less) try using either Excedrin migraine, or  ibuprofen OR 2 Aleve PLUS some caffeine (soda or coffee).  If your headache doesn't improve in 1-2 hours, then go ahead and take the imitrex. If you wake up with full migraine, start with the imitrex.  Start topiramate  at bedtime.  After a week (or longer if you prefer, especially if still having any side effects) increase to 2 pills at bedtime ( ).  Stay at the  dose, and f/u as scheduled in May. Bring your headache calendar with you. Try and note any potential triggers, including weather fronts. Note the severity of the headache and how you treated it on your calendar.  Note when you started medication, increased dose or if you stopped it and what the side effects.  Please restart Weight-Watchers since it was effective in the past. topiramate can also help with weight loss.  We can discuss weight in more detail at your physical.

## 2016-07-29 NOTE — Progress Notes (Signed)
Chief Complaint  Patient presents with  . Migraine    discuss migraine and possible other medications/causes.     She had D&C the end of February, related to heavy/frequent menses.  She hasn't had a period since then. She had prior ablation.  They now think she is menopausal, and periods should stop. Hasn't had a period since procedure but has continued to have migraines.  She used to have them just around her cycles.  (Previously reported to have migraines related to her cycle, about 2-3/month.) This last month she had 3-4 migraines (more than usual), requiring use of all 9 imitrex tablets.  She typically requires 2 imitrex with each headache (takes around 2-3 am, headache resolves, but takes another around noon when dull pain recurs). Second pill also makes the pain go away, but sometimes comes back the next morning, occasionally requires third pill..  Used to just need 2 per headache, recently has needed more, as headaches are lasting longer. Headaches last 2-2.5 days.  No known triggers.  Sometimes has allergies, wonders if triggering.  Some drainage in the mornings, sometimes bloody.  Occasional nasal congestion and frontal sinus congestion--not frequent, not necessarily related to her headaches.  Use 9 sumatriptan in the last month.  prior to that needed about every 2 months.  No longer having insomnia (see last visit).  Used ambien short-term--was effective, no side effects.  no longer needs.  Previous records reviewed--last used topamax in 2015.  Sounds like it helped initially, but maybe had some dull headaches (?unclear--one note stated dull headache for a few days only, another said that was the reason it was stopped). nortriptyline was tried in 2013--caused severe headaches.  PMH, PSH, SH reviewed  Outpatient Encounter Prescriptions as of 07/29/2016  Medication Sig Note  . aspirin-acetaminophen-caffeine (EXCEDRIN EXTRA STRENGTH) 250-250-65 MG tablet Take 2 tablets by mouth every 6  (six) hours as needed for headache.   . Cholecalciferol (VITAMIN D-3) 1000 units CAPS Take 1 capsule by mouth daily.   . Multiple Vitamins-Minerals (MULTIVITAMIN WITH MINERALS) tablet Take 1 tablet by mouth daily.   . vitamin B-12 (CYANOCOBALAMIN) 500 MCG tablet Take 500 mcg by mouth daily. 08/14/2014: .  . amitriptyline (ELAVIL) 10 MG tablet Start with 1 tablet by mouth at bedtime. Increase to 2 tablets if needed (Patient not taking: Reported on 07/29/2016)   . ibuprofen (ADVIL,MOTRIN) 800 MG tablet TAKE 1 TABLET BY MOUTH EVERY 8 HOURS AS NEEDED FOR PAIN OR CRAMPS   . SUMAtriptan (IMITREX) 100 MG tablet TAKE 1 TABLET BY MOUTH 1 TIME. MAY REPEAT IN 2 HOURS IF HEADACHE PERSISTS OR RECURS. MAX 2 TABLETS PER 24 HOURS   . topiramate (TOPAMAX) 25 MG tablet Start 1 tablet at bedtime; increase to 2 tablets at bedtime after a week (or as directed).   . zolpidem (AMBIEN) 10 MG tablet Take 0.5-1 tablets (5-10 mg total) by mouth at bedtime as needed for sleep. (Patient not taking: Reported on 07/29/2016)   . [DISCONTINUED] HYDROcodone-homatropine (HYCODAN) 5-1.5 MG/5ML syrup Take 5 mLs by mouth every 8 (eight) hours as needed for cough.   . [DISCONTINUED] misoprostol (CYTOTEC) 200 MCG tablet INSERT 1 TABLET VAGINALLY AT BEDTIME NIGHT PRIOR TO PROCEDURE   . [DISCONTINUED] SUMAtriptan (IMITREX) 100 MG tablet TAKE 1 TABLET BY MOUTH 1 TIME. MAY REPEAT IN 2 HOURS IF HEADACHE PERSISTS OR RECURS. MAX 2 TABLETS PER 24 HOURS (Patient not taking: Reported on 07/29/2016)    No facility-administered encounter medications on file as of 07/29/2016.    (  topiramate started today, not taking prior to visit).  No Known Allergies  ROS:  No fever, chills, dizziness, URI symptoms, chest pain, palpitations, cough, shortness of breath, nausea, vomiting, bowel changes, bleeding, bruising, rash.  Denies depression, anxiety, insomnia has resolved.  No numbness, tingling (just occasional in her arms at night, relieved by position change),  weakness, or other neurologic symptoms other than the headaches, which are typical for her migraines. She regained the weight she had lost.   PHYSICAL EXAM:  BP 110/60 (BP Location: Left Arm, Patient Position: Sitting, Cuff Size: Normal)   Pulse 68   Ht  (1.676 m)   Wt 256 lb 6.4 oz (116.3 kg)   BMI 41.38 kg/m   Wt Readings from Last 3 Encounters:  07/29/16 256 lb 6.4 oz (116.3 kg)  05/25/16 248 lb (112.5 kg)  01/28/16 242 lb 12.8 oz (110.1 kg)   Well appearing, pleasant female in no distress.  She has no current headache. HEENT: PERRL, EOMI, conjunctiva and sclera are clear, fundi benign. OP clear, sinuses nontender Neck: no lymphadenopathy, thyromegaly or carotid bruit Heart: regular rate and rhythm Lungs: clear bilaterally Neuro: alert and oriented, cranial nerves intact. Normal finger to nose. DTR's 2+ and symmetric, normal strength, sensation, gait Extremities: no edema, normal pulses Skin: normal turgor, no rash Psych: normal mood, affect, hygiene and grooming  ASSESSMENT/PLAN:  Menstrual migraine without status migrainosus, not intractable - Plan: SUMAtriptan (IMITREX) 100 MG tablet, topiramate (TOPAMAX) 25 MG tablet  Discussed further observation of headache pattern, vs re-trying topamax. Willing to re-try topamax (given some documentation showing headaches from med were short-term, and cannot recall; had well documented severe headaches with nortriptyline). Given her low blood pressure, likely wouldn't tolerate calcium channel or beta blockers.  Risks/side effects reviewed, and how to titrate up. Discussed treating any warning symptoms, paying attention to triggers, and what info to keep on calendar. Topiramate may help with weight loss--consider adding phentermine in future once stable on med.  25-30 min visit, more than 1/2 spent counseling.   If you get an early warning about a migraine, or if migraine is extremely mild (4/10 or less) try using either Excedrin  migraine, or  ibuprofen OR 2 Aleve PLUS some caffeine (soda or coffee).  If your headache doesn't improve in 1-2 hours, then go ahead and take the imitrex. If you wake up with full migraine, start with the imitrex.  Start topiramate  at bedtime.  After a week (or longer if you prefer, especially if still having any side effects) increase to 2 pills at bedtime ( ).  Stay at the  dose, and f/u as scheduled in May. Bring your headache calendar with you. Try and note any potential triggers, including weather fronts. Note the severity of the headache and how you treated it on your calendar.  Note when you started medication, increased dose or if you stopped it and what the side effects.  Please restart Weight-Watchers since it was effective in the past. topiramate can also help with weight loss.  We can discuss weight in more detail at your physical.

## 2016-08-16 ENCOUNTER — Telehealth: Payer: Self-pay | Admitting: Family Medicine

## 2016-08-16 DIAGNOSIS — G43839 Menstrual migraine, intractable, without status migrainosus: Secondary | ICD-10-CM

## 2016-08-16 NOTE — Telephone Encounter (Signed)
Pt says Topamax is giving her severe headaches

## 2016-08-16 NOTE — Telephone Encounter (Signed)
Noted--okay to stop it if she isn't able to tolerate it.  Offer referral to neurologist for her migraines, if interested in trying other preventative measures.

## 2016-08-16 NOTE — Telephone Encounter (Signed)
Called patient and asked her to call back and let me know if she would like a referral to neuro and I will put in system.

## 2016-08-17 NOTE — Telephone Encounter (Signed)
Is [facility] ok for neuro referral?

## 2016-08-17 NOTE — Telephone Encounter (Signed)
Pt called back to let Suzette Battiest know that she does want to be referred to a neuro

## 2016-08-17 NOTE — Telephone Encounter (Signed)
[facility] or [facility]--either is fine, depends on how long the wait is.

## 2016-08-18 NOTE — Telephone Encounter (Signed)
Referral entered for neuro referral. Anita Taylor

## 2016-09-06 ENCOUNTER — Other Ambulatory Visit: Payer: Self-pay | Admitting: Family Medicine

## 2016-09-06 DIAGNOSIS — Z1231 Encounter for screening mammogram for malignant neoplasm of breast: Secondary | ICD-10-CM

## 2016-09-09 ENCOUNTER — Ambulatory Visit (INDEPENDENT_AMBULATORY_CARE_PROVIDER_SITE_OTHER): Payer: BC Managed Care – PPO | Admitting: Neurology

## 2016-09-09 ENCOUNTER — Telehealth: Payer: Self-pay | Admitting: Neurology

## 2016-09-09 ENCOUNTER — Encounter: Payer: Self-pay | Admitting: Neurology

## 2016-09-09 VITALS — BP 120/71 | HR 57 | Ht 66.0 in | Wt 261.0 lb

## 2016-09-09 DIAGNOSIS — R51 Headache with orthostatic component, not elsewhere classified: Secondary | ICD-10-CM

## 2016-09-09 DIAGNOSIS — G43009 Migraine without aura, not intractable, without status migrainosus: Secondary | ICD-10-CM | POA: Diagnosis not present

## 2016-09-09 DIAGNOSIS — R519 Headache, unspecified: Secondary | ICD-10-CM

## 2016-09-09 DIAGNOSIS — R0683 Snoring: Secondary | ICD-10-CM | POA: Diagnosis not present

## 2016-09-09 MED ORDER — TOPIRAMATE ER 50 MG PO CAP24
50.0000 mg | ORAL_CAPSULE | Freq: Every day | ORAL | 6 refills | Status: DC
Start: 2016-09-09 — End: 2016-09-23

## 2016-09-09 NOTE — Telephone Encounter (Signed)
Referral sent to [facility] fax [phone].

## 2016-09-09 NOTE — Progress Notes (Signed)
[facility]    Provider:  Dr Lucia Gaskins Referring Provider: Joselyn Arrow, MD Primary Care Physician:  Joselyn Arrow, MD  CC:  migraine  HPI:  Anita Taylor is a 51 y.o. female here as a referral from Dr. Lynelle Doctor for migraines. She has had migraines since the age of 34 and they were around her period. 10 years ago she had a uterine ablation, the last 3 years they are worsening. Usually on the left side of the face and pain shootis from the back of the head to the eye. She has sensitivity to light, dizziness, nausea with the headaches. She is now taking all 9 pills of imitrex every month. The migraines start early morning 2-3am and they get better as the day goes on. Sleeping poorly was a trigger. She snores.  5-6 days a minth with headaches and most start at 2-3am. She sometimes during the day she will have dots in her vision, an aura. She has gained weight. She has been started on Topiramate. It gave her a headache. She gets some tingling in the hands like in the morning. No weakness. She has vision changes, blurry vision. No hearing changes. No other focal neurologic deficits, associated symptoms, inciting events or modifiable factors.  Reviewed notes, labs and imaging from outside physicians, which showed:   Medications tried: Nortriptyline, Topamax  Reviewed primary care notes. Patient has a past medical history of migraines often around her menses. She is menopausal and hasn't had a period but continues to have migraines which are worsening. She is to just have them related to her cycle but now they're increasing, 3-4 migraines are more requiring use of all 9 Imitrex traps each month she typically requires to Imitrex of each headache, one helps but takes another when the pain recurs. No known triggers. Migraines can last 2-3 days. Allergies may worsen. Using all Sumatriptan last month.  Review of Systems: Patient complains of symptoms per HPI as well as the following symptoms:  headaches, no CP, no SOB, dizziness. Pertinent negatives per HPI. All others negative.   Social History   Social History  . Marital status: Married    Spouse name: N/A  . Number of children: 1  . Years of education: N/A   Occupational History  . pre-K coach for [employer]   Social History Main Topics  . Smoking status: Never Smoker  . Smokeless tobacco: Never Used  . Alcohol use 0.6 oz/week    1 Glasses of wine per week     Comment: a glass of wine every 3-6 months.  . Drug use: No  . Sexual activity: Yes    Partners: Male    Birth control/ protection: Surgical   Other Topics Concern  . Not on file   Social History Narrative   Lives with husband, daughter, 1 dog.  No smoke exposure    Family History  Problem Relation Age of Onset  . Hypertension Father   . Migraines Sister   . Hypertension Mother   . Diabetes Maternal Grandmother   . Cancer Maternal Grandfather        prostate cancer (65)    Past Medical History:  Diagnosis Date  . Migraine, menstrual   . Vitamin D deficiency     Past Surgical History:  Procedure Laterality Date  . CESAREAN SECTION     x1  . ENDOMETRIAL ABLATION  08  . KELOID EXCISION     ears  . TUBAL LIGATION  Current Outpatient Prescriptions  Medication Sig Dispense Refill  . aspirin-acetaminophen-caffeine (EXCEDRIN EXTRA STRENGTH) 250-250-65 MG tablet Take 2 tablets by mouth every 6 (six) hours as needed for headache.    . Cholecalciferol (VITAMIN D-3) 1000 units CAPS Take 1 capsule by mouth daily.    . Magnesium 100 MG TABS Take by mouth.    . Melatonin 1 MG TABS Take by mouth.    . Multiple Vitamins-Minerals (MULTIVITAMIN WITH MINERALS) tablet Take 1 tablet by mouth daily.    . naproxen (NAPROSYN) 500 MG tablet naproxen 500 mg tablet    . SUMAtriptan (IMITREX) 100 MG tablet TAKE 1 TABLET BY MOUTH 1 TIME. MAY REPEAT IN 2 HOURS IF HEADACHE PERSISTS OR RECURS. MAX 2 TABLETS PER 24 HOURS 9 tablet 0  . vitamin  B-12 (CYANOCOBALAMIN) 500 MCG tablet Take 500 mcg by mouth daily.    . Topiramate ER (TROKENDI XR) 50 MG CP24 Take 50 mg by mouth at bedtime. 30 capsule 6   No current facility-administered medications for this visit.     Allergies as of 09/09/2016  . (No Known Allergies)    Vitals: BP 120/71 (BP Location: Right Arm, Patient Position: Sitting, Cuff Size: Normal)   Pulse (!) 57   Ht 5\' 6"  (1.676 m)   Wt 261 lb (118.4 kg)   BMI 42.13 kg/m  Last Weight:  Wt Readings from Last 1 Encounters:  09/09/16 261 lb (118.4 kg)   Last Height:   Ht Readings from Last 1 Encounters:  09/09/16 5\' 6"  (1.676 m)    Physical exam: Exam: Gen: NAD, conversant, well nourised, obese, well groomed                     CV: RRR, no MRG. No Carotid Bruits. No peripheral edema, warm, nontender Eyes: Conjunctivae clear without exudates or hemorrhage  Neuro: Detailed Neurologic Exam  Speech:    Speech is normal; fluent and spontaneous with normal comprehension.  Cognition:    The patient is oriented to person, place, and time;     recent and remote memory intact;     language fluent;     normal attention, concentration,     fund of knowledge Cranial Nerves:    The pupils are equal, round, and reactive to light. The fundi are normal and spontaneous venous pulsations are present. Visual fields are full to finger confrontation. Extraocular movements are intact. Trigeminal sensation is intact and the muscles of mastication are normal. The face is symmetric. The palate elevates in the midline. Hearing intact. Voice is normal. Shoulder shrug is normal. The tongue has normal motion without fasciculations.   Coordination:    Normal finger to nose and heel to shin. Normal rapid alternating movements.   Gait:    Heel-toe and tandem gait are normal.   Motor Observation:    No asymmetry, no atrophy, and no involuntary movements noted. Tone:    Normal muscle tone.    Posture:    Posture is normal. normal  erect    Strength:    Strength is V/V in the upper and lower limbs.      Sensation: intact to LT     Reflex Exam:  DTR's:    Deep tendon reflexes in the upper and lower extremities are normal bilaterally.   Toes:    The toes are downgoing bilaterally.   Clonus:    Clonus is absent.      Assessment/Plan:  51 year old female with nocturnal headaches that  are migrainous  - MRI brain: Patient with worsening headaches, worse supine and positional. vision changes. MRI of the brain to evaluate for space-occupying lesion or intracranial hypertension or other intra-intracranial issues. - Sleep evaluation: Headaches always wake her up 2:58 in the morning, worse in the morning - Recommend the healthy weight and wellness center: Weight loss - Take imitrex wih a 12 hour alleve: To extend the benefits. If you continue to have to take multiple Imitrex, we can change your triptan to a longer acting formula and see if that works. - [facility]  [facility] and massage: For musculoskeletal neck pain, cervicalgia and migraines - Change Topiramate to ER  Discussed: To prevent or relieve headaches, try the following: Cool Compress. Lie down and place a cool compress on your head.  Avoid headache triggers. If certain foods or odors seem to have triggered your migraines in the past, avoid them. A headache diary might help you identify triggers.  Include physical activity in your daily routine. Try a daily walk or other moderate aerobic exercise.  Manage stress. Find healthy ways to cope with the stressors, such as delegating tasks on your to-do list.  Practice relaxation techniques. Try deep breathing, yoga, massage and visualization.  Eat regularly. Eating regularly scheduled meals and maintaining a healthy diet might help prevent headaches. Also, drink plenty of fluids.  Follow a regular sleep schedule. Sleep deprivation might contribute to headaches Consider biofeedback. With this mind-body technique, you  learn to control certain bodily functions - such as muscle tension, heart rate and blood pressure - to prevent headaches or reduce headache pain.    Proceed to emergency room if you experience new or worsening symptoms or symptoms do not resolve, if you have new neurologic symptoms or if headache is severe, or for any concerning symptom.   Orders Placed This Encounter  Procedures  . MR BRAIN W WO CONTRAST  . Basic Metabolic Panel  . Ambulatory referral to Sleep Studies    Cc: Dr. Marlyne BeardsKnapp  Antonia Ahern, MD  [facility] [address] [place], [place] [id number]  Phone [phone] Fax [phone]

## 2016-09-09 NOTE — Patient Instructions (Addendum)
Remember to drink plenty of fluid, eat healthy meals and do not skip any meals. Try to eat protein with a every meal and eat a healthy snack such as fruit or nuts in between meals. Try to keep a regular sleep-wake schedule and try to exercise daily, particularly in the form of walking, 20-30 minutes a day, if you can.   As far as your medications are concerned, I would like to suggest: Trokendi 25mg  for one week and then 50mg  at bedtime Take imitrex with an alleve  As far as diagnostic testing: MRI brain, lab [facility]: massage and PT for cervical muscle tightness Sleep evaluation Healthy weight and wellness: [phone]  Our phone number is [phone]. We also have an after hours call service for urgent matters and there is a physician on-call for urgent questions. For any emergencies you know to call 911 or go to the nearest emergency room   Sleep Apnea Sleep apnea is a condition in which breathing pauses or becomes shallow during sleep. Episodes of sleep apnea usually last 10 seconds or longer, and they may occur as many as 20 times an hour. Sleep apnea disrupts your sleep and keeps your body from getting the rest that it needs. This condition can increase your risk of certain health problems, including:  Heart attack.  Stroke.  Obesity.  Diabetes.  Heart failure.  Irregular heartbeat. There are three kinds of sleep apnea:  Obstructive sleep apnea. This kind is caused by a blocked or collapsed airway.  Central sleep apnea. This kind happens when the part of the brain that controls breathing does not send the correct signals to the muscles that control breathing.  Mixed sleep apnea. This is a combination of obstructive and central sleep apnea. What are the causes? The most common cause of this condition is a collapsed or blocked airway. An airway can collapse or become blocked if:  Your throat muscles are abnormally relaxed.  Your tongue and tonsils are larger  than normal.  You are overweight.  Your airway is smaller than normal. What increases the risk? This condition is more likely to develop in people who:  Are overweight.  Smoke.  Have a smaller than normal airway.  Are elderly.  Are female.  Drink alcohol.  Take sedatives or tranquilizers.  Have a family history of sleep apnea. What are the signs or symptoms? Symptoms of this condition include:  Trouble staying asleep.  Daytime sleepiness and tiredness.  Irritability.  Loud snoring.  Morning headaches.  Trouble concentrating.  Forgetfulness.  Decreased interest in sex.  Unexplained sleepiness.  Mood swings.  Personality changes.  Feelings of depression.  Waking up often during the night to urinate.  Dry mouth.  Sore throat. How is this diagnosed? This condition may be diagnosed with:  A medical history.  A physical exam.  A series of tests that are done while you are sleeping (sleep study). These tests are usually done in a sleep lab, but they may also be done at home. How is this treated? Treatment for this condition aims to restore normal breathing and to ease symptoms during sleep. It may involve managing health issues that can affect breathing, such as high blood pressure or obesity. Treatment may include:  Sleeping on your side.  Using a decongestant if you have nasal congestion.  Avoiding the use of depressants, including alcohol, sedatives, and narcotics.  Losing weight if you are overweight.  Making changes to your diet.  Quitting smoking.  Using a device  to open your airway while you sleep, such as:  An oral appliance. This is a custom-made mouthpiece that shifts your lower jaw forward.  A continuous positive airway pressure (CPAP) device. This device delivers oxygen to your airway through a mask.  A nasal expiratory positive airway pressure (EPAP) device. This device has valves that you put into each nostril.  A bi-level  positive airway pressure (BPAP) device. This device delivers oxygen to your airway through a mask.  Surgery if other treatments do not work. During surgery, excess tissue is removed to create a wider airway. It is important to get treatment for sleep apnea. Without treatment, this condition can lead to:  High blood pressure.  Coronary artery disease.  (Men) An inability to achieve or maintain an erection (impotence).  Reduced thinking abilities. Follow these instructions at home:  Make any lifestyle changes that your health care provider recommends.  Eat a healthy, well-balanced diet.  Take over-the-counter and prescription medicines only as told by your health care provider.  Avoid using depressants, including alcohol, sedatives, and narcotics.  Take steps to lose weight if you are overweight.  If you were given a device to open your airway while you sleep, use it only as told by your health care provider.  Do not use any tobacco products, such as cigarettes, chewing tobacco, and e-cigarettes. If you need help quitting, ask your health care provider.  Keep all follow-up visits as told by your health care provider. This is important. Contact a health care provider if:  The device that you received to open your airway during sleep is uncomfortable or does not seem to be working.  Your symptoms do not improve.  Your symptoms get worse. Get help right away if:  You develop chest pain.  You develop shortness of breath.  You develop discomfort in your back, arms, or stomach.  You have trouble speaking.  You have weakness on one side of your body.  You have drooping in your face. These symptoms may represent a serious problem that is an emergency. Do not wait to see if the symptoms will go away. Get medical help right away. Call your local emergency services (911 in the [place] Do not drive yourself to the hospital. This information is not intended to replace advice given  to you by your health care provider. Make sure you discuss any questions you have with your health care provider. Document Released: 04/02/2002 Document Revised: 12/07/2015 Document Reviewed: 01/20/2015 Elsevier Interactive Patient Education  2017 Elsevier Inc.   Topiramate extended-release capsules What is this medicine? TOPIRAMATE (toe PYRE a mate) is used to treat seizures in adults or children with epilepsy. It is also used for the prevention of migraine headaches. This medicine may be used for other purposes; ask your health care provider or pharmacist if you have questions. COMMON BRAND NAME(S): Trokendi XR What should I tell my health care provider before I take this medicine? They need to know if you have any of these conditions: -cirrhosis of the liver or liver disease -diarrhea -glaucoma -kidney stones or kidney disease -lung disease like asthma, obstructive pulmonary disease, emphysema -metabolic acidosis -on a ketogenic diet -scheduled for surgery or a procedure -suicidal thoughts, plans, or attempt; a previous suicide attempt by you or a family member -an unusual or allergic reaction to topiramate, other medicines, foods, dyes, or preservatives -pregnant or trying to get pregnant -breast-feeding How should I use this medicine? Take this medicine by mouth with a glass of water.  Follow the directions on the prescription label. Trokendi XR capsules must be swallowed whole. Do not sprinkle on food, break, crush, dissolve, or chew. Qudexy XR capsules may be swallowed whole or opened and sprinkled on a small amount of soft food. This mixture must be swallowed immediately. Do not chew or store mixture for later use. You may take this medicine with meals. Take your medicine at regular intervals. Do not take it more often than directed. Talk to your pediatrician regarding the use of this medicine in children. Special care may be needed. While Trokendi XR may be prescribed for children  as young as 6 years and Qudexy XR may be prescribed for children as young as 2 years for selected conditions, precautions do apply. Overdosage: If you think you have taken too much of this medicine contact a poison control center or emergency room at once. NOTE: This medicine is only for you. Do not share this medicine with others. What if I miss a dose? If you miss a dose, take it as soon as you can. If it is almost time for your next dose, take only that dose. Do not take double or extra doses. What may interact with this medicine? Do not take this medicine with any of the following medications: -probenecid This medicine may also interact with the following medications: -acetazolamide -alcohol -amitriptyline -birth control pills -digoxin -hydrochlorothiazide -lithium -medicines for pain, sleep, or muscle relaxation -metformin -methazolamide -other seizure or epilepsy medicines -pioglitazone -risperidone This list may not describe all possible interactions. Give your health care provider a list of all the medicines, herbs, non-prescription drugs, or dietary supplements you use. Also tell them if you smoke, drink alcohol, or use illegal drugs. Some items may interact with your medicine. What should I watch for while using this medicine? Visit your doctor or health care professional for regular checks on your progress. Do not stop taking this medicine suddenly. This increases the risk of seizures if you are using this medicine to control epilepsy. Wear a medical identification bracelet or chain to say you have epilepsy or seizures, and carry a card that lists all your medicines. This medicine can decrease sweating and increase your body temperature. Watch for signs of deceased sweating or fever, especially in children. Avoid extreme heat, hot baths, and saunas. Be careful about exercising, especially in hot weather. Contact your health care provider right away if you notice a fever or  decrease in sweating. You should drink plenty of fluids while taking this medicine. If you have had kidney stones in the past, this will help to reduce your chances of forming kidney stones. If you have stomach pain, with nausea or vomiting and yellowing of your eyes or skin, call your doctor immediately. You may get drowsy, dizzy, or have blurred vision. Do not drive, use machinery, or do anything that needs mental alertness until you know how this medicine affects you. To reduce dizziness, do not sit or stand up quickly, especially if you are an older patient. Alcohol can increase drowsiness and dizziness. Avoid alcoholic drinks. Do not drink alcohol for 6 hours before or 6 hours after taking Trokendi XR. If you notice blurred vision, eye pain, or other eye problems, seek medical attention at once for an eye exam. The use of this medicine may increase the chance of suicidal thoughts or actions. Pay special attention to how you are responding while on this medicine. Any worsening of mood, or thoughts of suicide or dying should be reported to  your health care professional right away. This medicine may increase the chance of developing metabolic acidosis. If left untreated, this can cause kidney stones, bone disease, or slowed growth in children. Symptoms include breathing fast, fatigue, loss of appetite, irregular heartbeat, or loss of consciousness. Call your doctor immediately if you experience any of these side effects. Also, tell your doctor about any surgery you plan on having while taking this medicine since this may increase your risk for metabolic acidosis. Birth control pills may not work properly while you are taking this medicine. Talk to your doctor about using an extra method of birth control. Women who become pregnant while using this medicine may enroll in the [place] American Antiepileptic Drug Pregnancy Registry by calling [phone]. This registry collects information about the safety of  antiepileptic drug use during pregnancy. What side effects may I notice from receiving this medicine? Side effects that you should report to your doctor or health care professional as soon as possible: -allergic reactions like skin rash, itching or hives, swelling of the face, lips, or tongue -decreased sweating and/or rise in body temperature -depression -difficulty breathing, fast or irregular breathing patterns -difficulty speaking -difficulty walking or controlling muscle movements -hearing impairment -redness, blistering, peeling or loosening of the skin, including inside the mouth -tingling, pain or numbness in the hands or feet -unusually weak or tired -worsening of mood, thoughts or actions of suicide or dying Side effects that usually do not require medical attention (report to your doctor or health care professional if they continue or are bothersome): -altered taste -back pain, joint or muscle aches and pains -diarrhea, or constipation -headache -loss of appetite -nausea -stomach upset, indigestion -tremors This list may not describe all possible side effects. Call your doctor for medical advice about side effects. You may report side effects to FDA at 1-800-FDA-1088. Where should I keep my medicine? Keep out of the reach of children. Store at room temperature between 15 and 30 degrees C (59 and 86 degrees F) in a tightly closed container. Protect from moisture. Throw away any unused medicine after the expiration date. NOTE: This sheet is a summary. It may not cover all possible information. If you have questions about this medicine, talk to your doctor, pharmacist, or health care provider.  2018 Elsevier/Gold Standard (2015-08-01 12:33:11)

## 2016-09-10 LAB — BASIC METABOLIC PANEL
BUN / CREAT RATIO: 24 — AB (ref 9–23)
BUN: 16 mg/dL (ref 6–24)
CO2: 22 mmol/L (ref 18–29)
CREATININE: 0.67 mg/dL (ref 0.57–1.00)
Calcium: 9.4 mg/dL (ref 8.7–10.2)
Chloride: 102 mmol/L (ref 96–106)
GFR, EST AFRICAN AMERICAN: 119 mL/min/{1.73_m2} (ref >59)
GFR, EST NON AFRICAN AMERICAN: 104 mL/min/{1.73_m2} (ref >59)
GLUCOSE: 98 mg/dL (ref 65–99)
Potassium: 4.7 mmol/L (ref 3.5–5.2)
SODIUM: 138 mmol/L (ref 134–144)

## 2016-09-12 DIAGNOSIS — G43909 Migraine, unspecified, not intractable, without status migrainosus: Secondary | ICD-10-CM | POA: Insufficient documentation

## 2016-09-13 ENCOUNTER — Ambulatory Visit: Payer: BC Managed Care – PPO

## 2016-09-14 ENCOUNTER — Telehealth: Payer: Self-pay | Admitting: *Deleted

## 2016-09-14 NOTE — Telephone Encounter (Signed)
Spoke with patient and informed her that her labs show some mild dehydration but are otherwise normal. She stated she would be sure she drinks more water. She verbalized understanding, appreciation of call.

## 2016-09-22 NOTE — Progress Notes (Signed)
Chief Complaint  Patient presents with  . Annual Exam    fasting annual exam with pap. Did not do eye exam, going to do with eye doctor. UA showed 1+ blood no symptoms. Saw Dr Lucia Gaskins at neuro-did not give her rx for Imitrex and needs refill today. No other concerns.     Anita Taylor is a 51 y.o. female who presents for a complete physical.  She has the following concerns:  Migraines:  She was referred to neuro for her headaches, seen on 5/17.  MRI was ordered (not yet scheduled), as was sleep study. She was referred to [facility] and started on topirimate ER. She was getting the same dull headache as she got with the regular topamax. She took it for 2 weeks, then stopped.  She was given a sample of a nasal spray, she didn't think it worked, and really doesn't like using nasal medication. Needs imitrex refill today (didn't get from neuro).  Vitamin D deficiency: Last VItamin D level was low at 26 last year--she had been out of her OTC supplement for about 2 months. She continues to take a daily MVI plus 1000 IU of Vitamin D3. Previously required prescription replacement; 2 years ago level was 17.  Due for recheck.  She had D&C the end of February, related to heavy/frequent menses. She hasn't had a period since then. She had prior ablation.  They now think she is menopausal, and periods should stop. Had some hot flashes originally, they are tolerable now.  H/o borderline hyperlipidemia:  Improved with dietary changes.  Follows lowfat, low cholesterol diet usually, but admits that her diet hasn't been as good as last year. Eating more fried foods, not making healthy choices.  Doesn't eat much red meat, cheese, eggs.  Lab Results  Component Value Date   CHOL 197 09/18/2015   HDL 74 09/18/2015   LDLCALC 106 09/18/2015   TRIG 86 09/18/2015   CHOLHDL 2.7 09/18/2015    Immunization History  Administered Date(s) Administered  . Influenza Split 12/29/2011  . Influenza,inj,Quad  PF,36+ Mos 01/28/2016  . PPD Test 01/19/2012  . Tdap 01/19/2012   Last Pap smear: 07/25/2013--normal, no high risk HPV present Last mammogram:  today  Last colonoscopy: never Last DEXA: never  Dentist: twice yearly Ophtho: yearly, due now Exercise:walks 30 minutes 2-3 times/week in her neighborhood (not vigorous, but nonstop). No weight-bearing exercise.  Past Medical History:  Diagnosis Date  . Migraine, menstrual   . Vitamin D deficiency     Past Surgical History:  Procedure Laterality Date  . CESAREAN SECTION     x1  . ENDOMETRIAL ABLATION  08  . KELOID EXCISION     ears  . TUBAL LIGATION      Social History   Social History  . Marital status: Married    Spouse name: N/A  . Number of children: 1  . Years of education: N/A   Occupational History  . pre-K coach for [employer]   Social History Main Topics  . Smoking status: Never Smoker  . Smokeless tobacco: Never Used  . Alcohol use 0.6 oz/week    1 Glasses of wine per week     Comment: a glass of wine every 3-6 months.  . Drug use: No  . Sexual activity: Yes    Partners: Male    Birth control/ protection: Surgical   Other Topics Concern  . Not on file   Social History Narrative   Lives with  husband, daughter Anita Taylor([place]). No smoke exposure    Family History  Problem Relation Age of Onset  . Hypertension Father   . Migraines Sister   . Hypertension Mother   . Diabetes Maternal Grandmother   . Cancer Maternal Grandfather        prostate cancer (65)    Outpatient Encounter Prescriptions as of 09/23/2016  Medication Sig Note  . Cholecalciferol (VITAMIN D-3) 1000 units CAPS Take 1 capsule by mouth daily.   . Magnesium 100 MG TABS Take by mouth.   . Melatonin 1 MG TABS Take by mouth.   . Multiple Vitamins-Minerals (MULTIVITAMIN WITH MINERALS) tablet Take 1 tablet by mouth daily.   . SUMAtriptan (IMITREX) 100 MG tablet TAKE 1 TABLET BY MOUTH 1 TIME. MAY REPEAT IN 2 HOURS IF  HEADACHE PERSISTS OR RECURS. MAX 2 TABLETS PER 24 HOURS   . [DISCONTINUED] SUMAtriptan (IMITREX) 100 MG tablet TAKE 1 TABLET BY MOUTH 1 TIME. MAY REPEAT IN 2 HOURS IF HEADACHE PERSISTS OR RECURS. MAX 2 TABLETS PER 24 HOURS   . aspirin-acetaminophen-caffeine (EXCEDRIN EXTRA STRENGTH) 250-250-65 MG tablet Take 2 tablets by mouth every 6 (six) hours as needed for headache.   . naproxen (NAPROSYN) 500 MG tablet naproxen 500 mg tablet   . vitamin B-12 (CYANOCOBALAMIN) 500 MCG tablet Take 500 mcg by mouth daily. 08/14/2014: .  Marland Kitchen. [DISCONTINUED] Topiramate ER (TROKENDI XR) 50 MG CP24 Take 50 mg by mouth at bedtime.    No facility-administered encounter medications on file as of 09/23/2016.     No Known Allergies   ROS: The patient denies anorexia, fever, decreased hearing, ear pain, sore throat, breast concerns, chest pain, palpitations, dizziness, syncope, dyspnea on exertion, cough, swelling, nausea, vomiting, diarrhea, constipation, abdominal pain, melena, hematochezia, indigestion/heartburn, hematuria, incontinence, dysuria, vaginal bleeding, discharge, odor or itch, genital lesions, joint pains, numbness, tingling, weakness, tremor, suspicious skin lesions, depression, anxiety, abnormal bleeding/bruising, or enlarged lymph nodes. Migraine headaches as per HPI.Started PT last week for issues with neck pain contributing to headaches. No hair/skin/bowel/mood changes. +weight gain   PHYSICAL EXAM:  BP 136/76 (BP Location: Left Arm, Patient Position: Sitting, Cuff Size: Normal)   Pulse 60   Ht 5\' 6"  (1.676 m)   Wt 257 lb 9.6 oz (116.8 kg)   BMI 41.58 kg/m   Wt Readings from Last 3 Encounters:  09/23/16 257 lb 9.6 oz (116.8 kg)  09/09/16 261 lb (118.4 kg)  07/29/16 256 lb 6.4 oz (116.3 kg)   136/80 on repeat by MD   General Appearance:   Alert, cooperative, no distress, appears stated age  Head:   Normocephalic, without obvious abnormality, atraumatic  Eyes:   PERRL,  conjunctiva/corneas clear, EOM's intact, fundi benign  Ears:   Normal TM's and external ear canals  Nose:  Nares normal, mucosa normal, no drainage or sinus tenderness  Throat:  Lips, mucosa, and tongue normal; teeth and gums normal  Neck:  Supple, no lymphadenopathy; thyroid: no enlargement/tenderness/nodules; no carotid bruit or JVD  Back:  Spine nontender, no curvature, ROM normal, no CVA tenderness  Lungs:   Clear to auscultation bilaterally without wheezes, rales orronchi; respirations unlabored  Chest Wall:   No tenderness or deformity  Heart:   Regular rate and rhythm, S1 and S2 normal, no murmur, rub or gallop  Breast Exam:   No tenderness, masses, or nipple discharge or inversion.No axillary lymphadenopathy  Abdomen:   Soft, non-tender, nondistended, normoactive bowel sounds,   no masses, no hepatosplenomegaly  Genitalia:  Normal external genitalia without lesions. BUS and vagina normal; no cervical motion tenderness. No abnormal vaginal discharge. Uterus and adnexa not enlarged, nontender, no masses. Pap not performed  Rectal:   Normal tone, no masses or tenderness; guaiac negative stool  Extremities:  No clubbing, cyanosis or edema  Pulses:  2+ and symmetric all extremities  Skin:  Skin color, texture, turgor normal, no rashes or lesions  Lymph nodes:  Cervical, supraclavicular, and axillary nodes normal  Neurologic:  CNII-XII intact, normal strength, sensation and gait; reflexes 2+ and symmetric throughout   Psych: Normal mood, affect, hygiene and grooming    Chemistry      Component Value Date/Time   NA 138 09/09/2016 0830   K 4.7 09/09/2016 0830   CL 102 09/09/2016 0830   CO2 22 09/09/2016 0830   BUN 16 09/09/2016 0830   CREATININE 0.67 09/09/2016 0830   CREATININE 0.70 09/18/2015 0903      Component Value Date/Time   CALCIUM 9.4 09/09/2016  0830   ALKPHOS 66 09/18/2015 0903   AST 15 09/18/2015 0903   ALT 9 09/18/2015 0903   BILITOT 0.4 09/18/2015 1610       ASSESSMENT/PLAN:  Annual physical exam - Plan: POCT Urinalysis Dipstick, TSH, VITAMIN D 25 Hydroxy (Vit-D Deficiency, Fractures), Lipid panel  Obesity (BMI 35.0-39.9 without comorbidity) - counseled re: diet, exercise, risks of obesity  Vitamin D deficiency - Plan: VITAMIN D 25 Hydroxy (Vit-D Deficiency, Fractures)  Intractable menstrual migraine without status migrainosus - to f/u with neuro and schedule MRI and sleep study  Weight gain - Plan: TSH  Menstrual migraine without status migrainosus, not intractable - Plan: SUMAtriptan (IMITREX) 100 MG tablet  Discussed monthly self breast exams and yearly mammograms; at least 30 minutes of aerobic activity at least 5 days/week, weight-bearing exercise 2-3x/wk; proper sunscreen use reviewed; healthy diet, including goals of calcium and vitamin D intake and alcohol recommendations (less than or equal to 1 drink/day) reviewed; regular seatbelt use; changing batteries in smoke detectors. Immunization recommendations discussed--flu shots recommended yearly, Shingrix next year. Colonoscopy recommendations reviewed, age 19.

## 2016-09-23 ENCOUNTER — Ambulatory Visit
Admission: RE | Admit: 2016-09-23 | Discharge: 2016-09-23 | Disposition: A | Discharge status: Home / Self Care | Payer: BC Managed Care – PPO | Source: Ambulatory Visit | Attending: Family Medicine | Admitting: Family Medicine

## 2016-09-23 ENCOUNTER — Ambulatory Visit (INDEPENDENT_AMBULATORY_CARE_PROVIDER_SITE_OTHER): Payer: BC Managed Care – PPO | Admitting: Family Medicine

## 2016-09-23 ENCOUNTER — Encounter: Payer: Self-pay | Admitting: Family Medicine

## 2016-09-23 VITALS — BP 136/76 | HR 60 | Ht 66.0 in | Wt 257.6 lb

## 2016-09-23 DIAGNOSIS — E669 Obesity, unspecified: Secondary | ICD-10-CM

## 2016-09-23 DIAGNOSIS — G43829 Menstrual migraine, not intractable, without status migrainosus: Secondary | ICD-10-CM

## 2016-09-23 DIAGNOSIS — G43839 Menstrual migraine, intractable, without status migrainosus: Secondary | ICD-10-CM | POA: Diagnosis not present

## 2016-09-23 DIAGNOSIS — R635 Abnormal weight gain: Secondary | ICD-10-CM

## 2016-09-23 DIAGNOSIS — Z0000 Encounter for general adult medical examination without abnormal findings: Secondary | ICD-10-CM

## 2016-09-23 DIAGNOSIS — Z1231 Encounter for screening mammogram for malignant neoplasm of breast: Secondary | ICD-10-CM

## 2016-09-23 DIAGNOSIS — E559 Vitamin D deficiency, unspecified: Secondary | ICD-10-CM

## 2016-09-23 LAB — POCT URINALYSIS DIPSTICK
Bilirubin, UA: NEGATIVE
Glucose, UA: NEGATIVE
Ketones, UA: NEGATIVE
LEUKOCYTES UA: NEGATIVE
NITRITE UA: NEGATIVE
PH UA: 6 (ref 5.0–8.0)
PROTEIN UA: NEGATIVE
Spec Grav, UA: 1.025 (ref 1.010–1.025)
UROBILINOGEN UA: NEGATIVE U/dL — AB

## 2016-09-23 LAB — LIPID PANEL
CHOLESTEROL: 207 mg/dL — AB (ref <200)
HDL: 60 mg/dL (ref >50)
LDL Cholesterol: 132 mg/dL — ABNORMAL HIGH (ref <100)
Total CHOL/HDL Ratio: 3.5 Ratio (ref <5.0)
Triglycerides: 73 mg/dL (ref <150)
VLDL: 15 mg/dL (ref <30)

## 2016-09-23 LAB — VITAMIN D 25 HYDROXY (VIT D DEFICIENCY, FRACTURES): VIT D 25 HYDROXY: 20 ng/mL — AB (ref 30–100)

## 2016-09-23 LAB — TSH: TSH: 1.16 m[IU]/L

## 2016-09-23 MED ORDER — SUMATRIPTAN SUCCINATE 100 MG PO TABS: ORAL_TABLET | ORAL | 0 refills | Status: DC

## 2016-09-23 NOTE — Patient Instructions (Signed)

## 2016-09-24 MED ORDER — VITAMIN D (ERGOCALCIFEROL) 1.25 MG (50000 UNIT) PO CAPS: 50000.0000 [IU] | ORAL_CAPSULE | ORAL | 0 refills | Status: DC

## 2016-09-24 NOTE — Addendum Note (Signed)
Addended byJoselyn Arrow: Gearl Baratta on: 09/24/2016 01:39 PM   Modules accepted: Orders

## 2016-11-01 ENCOUNTER — Telehealth: Payer: Self-pay | Admitting: Neurology

## 2016-11-01 DIAGNOSIS — G43829 Menstrual migraine, not intractable, without status migrainosus: Secondary | ICD-10-CM

## 2016-11-01 MED ORDER — SUMATRIPTAN SUCCINATE 100 MG PO TABS: ORAL_TABLET | ORAL | 0 refills | Status: DC

## 2016-11-01 NOTE — Telephone Encounter (Signed)
Patient is calling to discuss headache medication but does not know the name.

## 2016-11-01 NOTE — Telephone Encounter (Signed)
Anita Taylor, I would like to try her on propranolol before bed 60mg  extended release once daily at bedtime and see if this helps. We can slowly titrate it up. In the meantime I highly encourage the sleep evaluation and MRI. Please discuss with patient. Stop immediately for: slow or uneven heartbeats;a light-headed feeling, like you might pass out;wheezing or trouble breathing;shortness of breath (even with mild exertion), swelling or any side effects. Thanks

## 2016-11-01 NOTE — Telephone Encounter (Signed)
Returned pt TC. She reports that she tried taking topiramate again but like in the past, she had worsening HAs when on the med so has since stopped taking it. Also has been previously unable to tolerate nortritpyline. Says that she has not yet scheduled MRI or sleep study. Has been trying to weed out any triggers, such as chocolate, first. Refills on sumatriptan e-scribed to verified pharmacy per pt request. She has a f/u appt already scheduled on 12/01/16.

## 2016-11-01 NOTE — Addendum Note (Signed)
Addended by: Donnelly AngelicaHOGAN, JENNIFER L on: 11/01/2016 03:18 PM   Modules accepted: Orders

## 2016-11-03 MED ORDER — PROPRANOLOL HCL ER 60 MG PO CP24: 60.0000 mg | ORAL_CAPSULE | Freq: Every day | ORAL | 11 refills | Status: DC

## 2016-11-03 NOTE — Telephone Encounter (Signed)
Called pt back and discussed starting new medication (ER propranolol). Reviewed possible side effects and pt is willing to give it a try. Rx e-scribed to verified pharmacy. Pt encouraged to proceed w/ MRI and sleep study. Verbalized understanding and appreciation for call.

## 2016-11-03 NOTE — Addendum Note (Signed)
Addended by: Donnelly AngelicaHOGAN, JENNIFER L on: 11/03/2016 03:29 PM   Modules accepted: Orders

## 2016-12-01 ENCOUNTER — Encounter: Payer: Self-pay | Admitting: Neurology

## 2016-12-01 ENCOUNTER — Ambulatory Visit (INDEPENDENT_AMBULATORY_CARE_PROVIDER_SITE_OTHER): Payer: BC Managed Care – PPO | Admitting: Neurology

## 2016-12-01 VITALS — BP 109/64 | HR 48 | Ht 65.5 in | Wt 262.6 lb

## 2016-12-01 DIAGNOSIS — G43009 Migraine without aura, not intractable, without status migrainosus: Secondary | ICD-10-CM

## 2016-12-01 DIAGNOSIS — G43829 Menstrual migraine, not intractable, without status migrainosus: Secondary | ICD-10-CM | POA: Diagnosis not present

## 2016-12-01 MED ORDER — SUMATRIPTAN SUCCINATE 100 MG PO TABS: ORAL_TABLET | ORAL | 11 refills | Status: DC

## 2016-12-01 MED ORDER — PROPRANOLOL HCL ER 60 MG PO CP24: 60.0000 mg | ORAL_CAPSULE | Freq: Every day | ORAL | 11 refills | Status: DC

## 2016-12-01 NOTE — Progress Notes (Signed)
[facility]    Provider:  Dr Lucia GaskinsAhern Referring Provider: Joselyn ArrowKnapp, Eve, MD Primary Care Physician:  Joselyn ArrowKnapp, Eve, MD  CC:  migraine  Interval history 12/01/2016: She went to chiropractor and felt better, she stopped eating chocolate. She is sleeping better. She takes propranolol. Discussed hydration, no side effects to the propranolol, no dizziness or lightheadedness. In the last month her headaches have improved, 27 headache free days and the other 3 days the imitrex worked quickly.   HPI:  Anita Taylor is a 51 y.o. female here as a referral from Dr. Lynelle DoctorKnapp for migraines. She has had migraines since the age of 89 and they were around her period. 10 years ago she had a uterine ablation, the last 3 years they are worsening. Usually on the left side of the face and pain shootis from the back of the head to the eye. She has sensitivity to light, dizziness, nausea with the headaches. She is now taking all 9 pills of imitrex every month. The migraines start early morning 2-3am and they get better as the day goes on. Sleeping poorly was a trigger. She snores.  5-6 days a minth with headaches and most start at 2-3am. She sometimes during the day she will have dots in her vision, an aura. She has gained weight. She has been started on Topiramate. It gave her a headache. She gets some tingling in the hands like in the morning. No weakness. She has vision changes, blurry vision. No hearing changes. No other focal neurologic deficits, associated symptoms, inciting events or modifiable factors.  Reviewed notes, labs and imaging from outside physicians, which showed:   Medications tried: Nortriptyline, Topamax(IR and ER), propranolol  Reviewed primary care notes. Patient has a past medical history of migraines often around her menses. She is menopausal and hasn't had a period but continues to have migraines which are worsening. She is to just have them related to her cycle but now they're  increasing, 3-4 migraines are more requiring use of all 9 Imitrex traps each month she typically requires to Imitrex of each headache, one helps but takes another when the pain recurs. No known triggers. Migraines can last 2-3 days. Allergies may worsen. Using all Sumatriptan last month.  Review of Systems: Patient complains of symptoms per HPI as well as the following symptoms: headaches, no CP, no SOB, dizziness. Pertinent negatives per HPI. All others negative.  Social History   Social History  . Marital status: Married    Spouse name: N/A  . Number of children: 1  . Years of education: N/A   Occupational History  . pre-K coach for [employer]   Social History Main Topics  . Smoking status: Never Smoker  . Smokeless tobacco: Never Used  . Alcohol use 0.6 oz/week    1 Glasses of wine per week     Comment: a glass of wine every 3-6 months.  . Drug use: No  . Sexual activity: Yes    Partners: Male    Birth control/ protection: Surgical   Other Topics Concern  . Not on file   Social History Narrative   Lives with husband, daughter Anita Taylor([place]). No smoke exposure   Caffeine: tea and 1-2 diet sodas per week    Family History  Problem Relation Age of Onset  . Hypertension Father   . Migraines Sister   . Hypertension Mother   . Diabetes Maternal Grandmother   . Cancer Maternal Grandfather  prostate cancer (65)    Past Medical History:  Diagnosis Date  . Migraine, menstrual   . Vitamin D deficiency     Past Surgical History:  Procedure Laterality Date  . CESAREAN SECTION     x1  . ENDOMETRIAL ABLATION  08  . KELOID EXCISION     ears  . TUBAL LIGATION      Current Outpatient Prescriptions  Medication Sig Dispense Refill  . propranolol ER (INDERAL LA) 60 MG 24 hr capsule Take 1 capsule (60 mg total) by mouth daily. 30 capsule 11  . SUMAtriptan (IMITREX) 100 MG tablet TAKE 1 TABLET BY MOUTH 1 TIME. MAY REPEAT IN 2 HOURS IF HEADACHE  PERSISTS OR RECURS. MAX 2 TABLETS PER 24 HOURS 9 tablet 0  . vitamin B-12 (CYANOCOBALAMIN) 500 MCG tablet Take 500 mcg by mouth daily.    . Cholecalciferol (VITAMIN D-3) 1000 units CAPS Take 1 capsule by mouth daily.    . Multiple Vitamins-Minerals (MULTIVITAMIN WITH MINERALS) tablet Take 1 tablet by mouth daily.    . naproxen (NAPROSYN) 500 MG tablet naproxen 500 mg tablet prn    . Vitamin D, Ergocalciferol, (DRISDOL) 50000 units CAPS capsule Take 1 capsule (50,000 Units total) by mouth every 7 (seven) days. (Patient not taking: Reported on 12/01/2016) 12 capsule 0   No current facility-administered medications for this visit.     Allergies as of 12/01/2016  . (No Known Allergies)    Vitals: BP 109/64   Pulse (!) 48   Ht 5' 5.5" (1.664 m)   Wt 262 lb 9.6 oz (119.1 kg)   BMI 43.03 kg/m  Last Weight:  Wt Readings from Last 1 Encounters:  12/01/16 262 lb 9.6 oz (119.1 kg)   Last Height:   Ht Readings from Last 1 Encounters:  12/01/16 5' 5.5" (1.664 m)   Physical exam: Exam: Gen: NAD, conversant, well nourised, obese, well groomed                     CV: RRR, no MRG. No Carotid Bruits. No peripheral edema, warm, nontender Eyes: Conjunctivae clear without exudates or hemorrhage  Neuro: Detailed Neurologic Exam  Speech:    Speech is normal; fluent and spontaneous with normal comprehension.  Cognition:    The patient is oriented to person, place, and time;     recent and remote memory intact;     language fluent;     normal attention, concentration,     fund of knowledge Cranial Nerves:    The pupils are equal, round, and reactive to light. The fundi are normal and spontaneous venous pulsations are present. Visual fields are full to finger confrontation. Extraocular movements are intact. Trigeminal sensation is intact and the muscles of mastication are normal. The face is symmetric. The palate elevates in the midline. Hearing intact. Voice is normal. Shoulder shrug is normal.  The tongue has normal motion without fasciculations.   Coordination:    Normal finger to nose and heel to shin. Normal rapid alternating movements.   Gait:    Heel-toe and tandem gait are normal.   Motor Observation:    No asymmetry, no atrophy, and no involuntary movements noted. Tone:    Normal muscle tone.    Posture:    Posture is normal. normal erect    Strength:    Strength is V/V in the upper and lower limbs.      Sensation: intact to LT     Reflex Exam:  DTR's:  Deep tendon reflexes in the upper and lower extremities are normal bilaterally.   Toes:    The toes are downgoing bilaterally.   Clonus:    Clonus is absent.   Assessment/Plan:  51 year old female with nocturnal headaches that are migrainous, they are improved with lifestyle modifications and propranolol with Imitrex for acute management  - MRI brain: Patient with worsening headaches, worse supine and positional. vision changes. MRI of the brain to evaluate for space-occupying lesion or intracranial hypertension or other intra-intracranial issues. (Patient did not have this completed this) - Sleep evaluation: Headaches always wake her up 2:58 in the morning, worse in the morning. (Patient did not have this completed) - Recommend the healthy [facility] wellness center: Weight loss - Take imitrex wih a 12 hour alleve: To extend the benefits. If you continue to have to take multiple Imitrex, we can change your triptan to a longer acting formula and see if that works. - Integrative  PT and massage: For musculoskeletal neck pain, cervicalgia and migraines  Naomie Dean, MD  [facility] [address] [place], [place] [address]  Phone [phone] Fax [phone]  A total of 15 minutes was spent face-to-face with this patient. Over half this time was spent on counseling patient on the migraine diagnosis and different diagnostic and therapeutic options available.

## 2016-12-23 ENCOUNTER — Encounter (INDEPENDENT_AMBULATORY_CARE_PROVIDER_SITE_OTHER): Payer: BC Managed Care – PPO

## 2016-12-29 ENCOUNTER — Ambulatory Visit (INDEPENDENT_AMBULATORY_CARE_PROVIDER_SITE_OTHER): Payer: BC Managed Care – PPO | Admitting: Family Medicine

## 2016-12-29 ENCOUNTER — Encounter (INDEPENDENT_AMBULATORY_CARE_PROVIDER_SITE_OTHER): Payer: Self-pay | Admitting: Family Medicine

## 2016-12-29 VITALS — BP 134/78 | HR 41 | Temp 97.8°F | Ht 66.0 in | Wt 253.0 lb

## 2016-12-29 DIAGNOSIS — E7849 Other hyperlipidemia: Secondary | ICD-10-CM

## 2016-12-29 DIAGNOSIS — Z1331 Encounter for screening for depression: Secondary | ICD-10-CM

## 2016-12-29 DIAGNOSIS — R001 Bradycardia, unspecified: Secondary | ICD-10-CM

## 2016-12-29 DIAGNOSIS — R5383 Other fatigue: Secondary | ICD-10-CM

## 2016-12-29 DIAGNOSIS — E669 Obesity, unspecified: Secondary | ICD-10-CM

## 2016-12-29 DIAGNOSIS — Z0289 Encounter for other administrative examinations: Secondary | ICD-10-CM

## 2016-12-29 DIAGNOSIS — Z1389 Encounter for screening for other disorder: Secondary | ICD-10-CM

## 2016-12-29 DIAGNOSIS — R0602 Shortness of breath: Secondary | ICD-10-CM | POA: Diagnosis not present

## 2016-12-29 DIAGNOSIS — IMO0001 Reserved for inherently not codable concepts without codable children: Secondary | ICD-10-CM

## 2016-12-29 DIAGNOSIS — E559 Vitamin D deficiency, unspecified: Secondary | ICD-10-CM

## 2016-12-29 DIAGNOSIS — Z6841 Body Mass Index (BMI) 40.0 and over, adult: Secondary | ICD-10-CM

## 2016-12-29 DIAGNOSIS — E784 Other hyperlipidemia: Secondary | ICD-10-CM

## 2016-12-29 NOTE — Progress Notes (Signed)
Office: [phone]  /  Fax: [phone]   Dear Dr. Lucia Gaskins,   Thank you for referring Prachi Oftedahl Decaprio to our clinic. The following note includes my evaluation and treatment recommendations.  HPI:   Chief Complaint: OBESITY    Anita Taylor has been referred by Desma Maxim B. Lucia Gaskins, MD for consultation regarding her obesity and obesity related comorbidities.    Anita Taylor (MR# [id number]) is a 51 y.o. female who presents on 12/29/2016 for obesity evaluation and treatment. Current BMI is Body mass index is 40.84 kg/m.Anita Taylor has been struggling with her weight for many years and has been unsuccessful in either losing weight, maintaining weight loss, or reaching her healthy weight goal.     Anita Taylor attended our information session and states she is currently in the action stage of change and ready to dedicate time achieving and maintaining a healthier weight. [name] is interested in becoming our patient and working on intensive lifestyle modifications including (but not limited to) diet, exercise and weight loss.    [name] states her family eats meals together she thinks her family will eat healthier with  her her desired weight loss is 78 lbs she has been heavy most of  her life she started gaining weight over the last 10 to 15 yrs her heaviest weight ever was 265 lbs. she has significant food cravings issues  she snacks frequently in the evenings she is frequently drinking liquids with calories she frequently makes poor food choices she frequently eats larger portions than normal  she has binge eating behaviors she struggles with emotional eating    [name] feels her energy is lower than it should be. This has worsened with weight gain and has not worsened recently. [name] admits to daytime somnolence and  admits to waking up still tired. Patient is at risk for obstructive sleep apnea. Patent has a history of symptoms of daytime fatigue and morning fatigue. Patient generally gets 5  hours of sleep per night, and states they generally have restless sleep. Snoring is present. Apneic episodes are not present. Epworth Sleepiness Score is 7  Dyspnea on exertion Anita Taylor notes increasing shortness of breath with exercising and seems to be worsening over time with weight gain. She notes getting out of breath sooner with activity than she used to. This has not gotten worse recently. Anita Taylor denies orthopnea.  Vitamin D deficiency Anita Taylor has a diagnosis of vitamin D deficiency. She is currently taking OTC vit D and is not yet at goal. She denies nausea, vomiting or muscle weakness.  Sinus Bradycardia Anita Taylor has a history of a slower heart rate (unknown baseline) but has been on propranolol for migraines recently. She denies feeling lightheaded or any syncopal episodes.  Hyperlipidemia Anita Taylor has hyperlipidemia and is attempting to improve her cholesterol levels with intensive lifestyle modification including a low saturated fat diet, exercise and weight loss. She denies any chest pain, claudication or myalgias.   Depression Screen Anita Taylor's Food and Mood (modified PHQ-9) score was  Depression screen PHQ 2/9 12/29/2016  Decreased Interest 1  Down, Depressed, Hopeless 1  PHQ - 2 Score 2  Altered sleeping 1  Tired, decreased energy 2  Change in appetite 1  Feeling bad or failure about yourself  2  Trouble concentrating 2  Moving slowly or fidgety/restless 2  Suicidal thoughts 0  PHQ-9 Score 12    ALLERGIES: No Known Allergies  MEDICATIONS: Current Outpatient Prescriptions on File Prior to Visit  Medication Sig Dispense Refill  Cholecalciferol (VITAMIN D-3) 1000 units CAPS Take 1 capsule by mouth daily.     propranolol ER (INDERAL LA) 60 MG 24 hr capsule Take 1 capsule (60 mg total) by mouth daily. 30 capsule 11   SUMAtriptan (IMITREX) 100 MG tablet TAKE 1 TABLET BY MOUTH 1 TIME. MAY REPEAT IN 2 HOURS IF HEADACHE PERSISTS OR RECURS. MAX 2 TABLETS PER 24 HOURS 9 tablet 11    vitamin B-12 (CYANOCOBALAMIN) 500 MCG tablet Take 500 mcg by mouth daily.     Vitamin D, Ergocalciferol, (DRISDOL) 50000 units CAPS capsule Take 1 capsule (50,000 Units total) by mouth every 7 (seven) days. (Patient not taking: Reported on 12/01/2016) 12 capsule 0   No current facility-administered medications on file prior to visit.     PAST MEDICAL HISTORY: Past Medical History:  Diagnosis Date   Constipation    Migraine, menstrual    Vitamin D deficiency     PAST SURGICAL HISTORY: Past Surgical History:  Procedure Laterality Date   CESAREAN SECTION     x1   ENDOMETRIAL ABLATION  08   KELOID EXCISION     ears   TUBAL LIGATION      SOCIAL HISTORY: Social History  Substance Use Topics   Smoking status: Never Smoker   Smokeless tobacco: Never Used   Alcohol use 0.6 oz/week    1 Glasses of wine per week     Comment: a glass of wine every 3-6 months.    FAMILY HISTORY: Family History  Problem Relation Age of Onset   Hypertension Father    Hyperlipidemia Father    Obesity Father    Migraines Sister    Hypertension Mother    Obesity Mother    Diabetes Maternal Grandmother    Cancer Maternal Grandfather        prostate cancer (65)    ROS: Review of Systems  Constitutional: Positive for malaise/fatigue.  Respiratory: Positive for shortness of breath (on exertion).   Cardiovascular: Negative for chest pain, orthopnea and claudication.  Gastrointestinal: Positive for constipation. Negative for nausea and vomiting.  Musculoskeletal: Negative for myalgias.       Negative muscle weakness  Neurological: Positive for headaches.       Negative lightheadedness  Psychiatric/Behavioral: The patient has insomnia.     PHYSICAL EXAM: Blood pressure 134/78, pulse (!) 41, temperature 97.8 F (36.6 C), height 5\' 6"  (1.676 m), weight 253 lb (114.8 kg), SpO2 99 %. Body mass index is 40.84 kg/m. Physical Exam  Constitutional: She is oriented to person, place,  and time. She appears well-developed and well-nourished.  Cardiovascular: Bradycardia present.   Pulmonary/Chest: Effort normal.  Musculoskeletal: Normal range of motion.  Neurological: She is oriented to person, place, and time.  Skin: Skin is warm and dry.  Psychiatric: She has a normal mood and affect. Her behavior is normal.  Vitals reviewed.   RECENT LABS AND TESTS: BMET    Component Value Date/Time   NA 138 09/09/2016 0830   K 4.7 09/09/2016 0830   CL 102 09/09/2016 0830   CO2 22 09/09/2016 0830   GLUCOSE 98 09/09/2016 0830   GLUCOSE 82 09/18/2015 0903   BUN 16 09/09/2016 0830   CREATININE 0.67 09/09/2016 0830   CREATININE 0.70 09/18/2015 0903   CALCIUM 9.4 09/09/2016 0830   GFRNONAA 104 09/09/2016 0830   GFRAA 119 09/09/2016 0830   No results found for: HGBA1C No results found for: INSULIN CBC    Component Value Date/Time   WBC 6.3 09/18/2015 0903  RBC 4.05 09/18/2015 0903   HGB 12.5 09/18/2015 0903   HCT 37.7 09/18/2015 0903   PLT 259 09/18/2015 0903   MCV 93.1 09/18/2015 0903   MCH 30.9 09/18/2015 0903   MCHC 33.2 09/18/2015 0903   RDW 13.5 09/18/2015 0903   LYMPHSABS 1,638 09/18/2015 0903   MONOABS 441 09/18/2015 0903   EOSABS 252 09/18/2015 0903   BASOSABS 0 09/18/2015 0903   Iron/TIBC/Ferritin/ %Sat No results found for: IRON, TIBC, FERRITIN, IRONPCTSAT Lipid Panel     Component Value Date/Time   CHOL 207 (H) 09/23/2016 0927   TRIG 73 09/23/2016 0927   HDL 60 09/23/2016 0927   CHOLHDL 3.5 09/23/2016 0927   VLDL 15 09/23/2016 0927   LDLCALC 132 (H) 09/23/2016 0927   Hepatic Function Panel     Component Value Date/Time   PROT 7.2 09/18/2015 0903   ALBUMIN 4.0 09/18/2015 0903   AST 15 09/18/2015 0903   ALT 9 09/18/2015 0903   ALKPHOS 66 09/18/2015 0903   BILITOT 0.4 09/18/2015 0903      Component Value Date/Time   TSH 1.16 09/23/2016 0927   TSH 1.35 09/18/2015 0903   TSH 1.753 07/30/2013 0850    ECG  shows NSR with a rate of 40  BPM INDIRECT CALORIMETER done today shows a VO2 of 219 and a REE of 1523.  Her calculated basal metabolic rate is [id number] thus her basal metabolic rate is worse than expected.    ASSESSMENT AND PLAN: Other fatigue - Plan: EKG 12-Lead, Vitamin B12, CBC With Differential, Comprehensive metabolic panel, Folate, Hemoglobin A1c, Insulin, random, T3, T4, free, TSH  Shortness of breath on exertion  Vitamin D deficiency - Plan: VITAMIN D 25 Hydroxy (Vit-D Deficiency, Fractures)  Sinus bradycardia  Other hyperlipidemia - Plan: Lipid Panel With LDL/HDL Ratio  Depression screening  Class 3 obesity with serious comorbidity and body mass index (BMI) of 40.0 to 44.9 in adult, unspecified obesity type (HCC)  PLAN: Fatigue Anita StanleyLisa was informed that her fatigue may be related to obesity, depression or many other causes. Labs will be ordered, and in the meanwhile Anita StanleyLisa has agreed to work on diet, exercise and weight loss to help with fatigue. Proper sleep hygiene was discussed including the need for 7-8 hours of quality sleep each night. A sleep study was not ordered based on symptoms and Epworth score.  Dyspnea on exertion Nashiya's shortness of breath appears to be obesity related and exercise induced. She has agreed to work on weight loss and gradually increase exercise to treat her exercise induced shortness of breath. If Anita StanleyLisa follows our instructions and loses weight without improvement of her shortness of breath, we will plan to refer to pulmonology. We will monitor this condition regularly. Anita StanleyLisa agrees to this plan.  Vitamin D Deficiency Anita StanleyLisa was informed that low vitamin D levels contributes to fatigue and are associated with obesity, breast, and colon cancer. She agrees to continue to take prescription OTC Vit D for now and we will check labs and will follow up for routine testing of vitamin D, at least 2-3 times per year. She was informed of the risk of over-replacement of vitamin D and agrees to not  increase her dose unless he discusses this with us first.  Sinus Bradycardia We will continue to monitor for symptoms and may need to change propranolol if symptoms develop. Anita StanleyLisa agrees to follow up with our clinic in 2 weeks.  Hyperlipidemia Anita StanleyLisa was informed of the American Heart Association Guidelines emphasizing intensive lifestyle  modifications as the first line treatment for hyperlipidemia. We discussed many lifestyle modifications today in depth, and Syeda will continue to work on decreasing saturated fats such as fatty red meat, butter and many fried foods. She will also increase vegetables and lean protein in her diet and continue to work on exercise and weight loss efforts.  Depression Screen Altheia had a moderately positive depression screening. Depression is commonly associated with obesity and often results in emotional eating behaviors. We will monitor this closely and work on CBT to help improve the non-hunger eating patterns. Referral to Psychology may be required if no improvement is seen as she continues in our clinic.  Obesity Vernadine is currently in the action stage of change and her goal is to continue with weight loss efforts. I recommend Britni begin the structured treatment plan as follows:  She has agreed to follow the Category 2 plan +100 calories Lameka has been instructed to eventually work up to a goal of 150 minutes of combined cardio and strengthening exercise per week for weight loss and overall health benefits. We discussed the following Behavioral Modification Strategies today: increasing lean protein intake, decreasing simple carbohydrates  and work on meal planning and easy cooking plans   She was informed of the importance of frequent follow up visits to maximize her success with intensive lifestyle modifications for her multiple health conditions. She was informed we would discuss her lab results at her next visit unless there is a critical issue that needs to be  addressed sooner. Phung agreed to keep her next visit at the agreed upon time to discuss these results.  I, Nevada Crane, am acting as transcriptionist for  Quillian Quince, MD  I have reviewed the above documentation for accuracy and completeness, and I agree with the above. -Quillian Quince, MD   OBESITY BEHAVIORAL INTERVENTION VISIT  Today's visit was # 1 out of 22.  Starting weight: 253 lbs Starting date: 12/29/16 Today's weight : 253 lbs Today's date: 12/29/2016 Total lbs lost to date: 0 (Patients must lose 7 lbs in the first 6 months to continue with counseling)   ASK: We discussed the diagnosis of obesity with Wilmer Floor Rezabek today and Kylen agreed to give [place] permission to discuss obesity behavioral modification therapy today.  ASSESS: Jesenia has the diagnosis of obesity and her BMI today is 40.85 Elizet is in the action stage of change   ADVISE: Dekayla was educated on the multiple health risks of obesity as well as the benefit of weight loss to improve her health. She was advised of the need for long term treatment and the importance of lifestyle modifications.  AGREE: Multiple dietary modification options and treatment options were discussed and  Brynley agreed to follow the Category 2 plan +100 calories We discussed the following Behavioral Modification Strategies today: increasing lean protein intake, decreasing simple carbohydrates  and work on meal planning and easy cooking plans

## 2016-12-30 LAB — COMPREHENSIVE METABOLIC PANEL
A/G RATIO: 1.4 (ref 1.2–2.2)
ALT: 14 IU/L (ref 0–32)
AST: 18 IU/L (ref 0–40)
Albumin: 4.2 g/dL (ref 3.5–5.5)
Alkaline Phosphatase: 76 IU/L (ref 39–117)
BUN/Creatinine Ratio: 20 (ref 9–23)
BUN: 12 mg/dL (ref 6–24)
Bilirubin Total: 0.3 mg/dL (ref 0.0–1.2)
CALCIUM: 9.2 mg/dL (ref 8.7–10.2)
CO2: 25 mmol/L (ref 20–29)
Chloride: 105 mmol/L (ref 96–106)
Creatinine, Ser: 0.61 mg/dL (ref 0.57–1.00)
GFR calc Af Amer: 123 mL/min/{1.73_m2} (ref >59)
GFR, EST NON AFRICAN AMERICAN: 107 mL/min/{1.73_m2} (ref >59)
Globulin, Total: 2.9 g/dL (ref 1.5–4.5)
Glucose: 93 mg/dL (ref 65–99)
POTASSIUM: 4.7 mmol/L (ref 3.5–5.2)
Sodium: 142 mmol/L (ref 134–144)
Total Protein: 7.1 g/dL (ref 6.0–8.5)

## 2016-12-30 LAB — LIPID PANEL WITH LDL/HDL RATIO
CHOLESTEROL TOTAL: 171 mg/dL (ref 100–199)
HDL: 46 mg/dL (ref >39)
LDL CALC: 108 mg/dL — AB (ref 0–99)
LDl/HDL Ratio: 2.3 ratio (ref 0.0–3.2)
Triglycerides: 84 mg/dL (ref 0–149)
VLDL CHOLESTEROL CAL: 17 mg/dL (ref 5–40)

## 2016-12-30 LAB — CBC WITH DIFFERENTIAL
BASOS: 0 %
Basophils Absolute: 0 10*3/uL (ref 0.0–0.2)
EOS (ABSOLUTE): 0.1 10*3/uL (ref 0.0–0.4)
EOS: 2 %
HEMOGLOBIN: 12.2 g/dL (ref 11.1–15.9)
Hematocrit: 37 % (ref 34.0–46.6)
IMMATURE GRANS (ABS): 0 10*3/uL (ref 0.0–0.1)
Immature Granulocytes: 0 %
LYMPHS: 28 %
Lymphocytes Absolute: 1.6 10*3/uL (ref 0.7–3.1)
MCH: 30.4 pg (ref 26.6–33.0)
MCHC: 33 g/dL (ref 31.5–35.7)
MCV: 92 fL (ref 79–97)
MONOCYTES: 5 %
Monocytes Absolute: 0.3 10*3/uL (ref 0.1–0.9)
NEUTROS ABS: 3.5 10*3/uL (ref 1.4–7.0)
NEUTROS PCT: 65 %
RBC: 4.01 x10E6/uL (ref 3.77–5.28)
RDW: 14.2 % (ref 12.3–15.4)
WBC: 5.5 10*3/uL (ref 3.4–10.8)

## 2016-12-30 LAB — FOLATE: Folate: 11.2 ng/mL (ref >3.0)

## 2016-12-30 LAB — HEMOGLOBIN A1C
ESTIMATED AVERAGE GLUCOSE: 114 mg/dL
HEMOGLOBIN A1C: 5.6 % (ref 4.8–5.6)

## 2016-12-30 LAB — T4, FREE: FREE T4: 1.31 ng/dL (ref 0.82–1.77)

## 2016-12-30 LAB — VITAMIN B12: Vitamin B-12: 497 pg/mL (ref 232–1245)

## 2016-12-30 LAB — VITAMIN D 25 HYDROXY (VIT D DEFICIENCY, FRACTURES): Vit D, 25-Hydroxy: 22.8 ng/mL — ABNORMAL LOW (ref 30.0–100.0)

## 2016-12-30 LAB — INSULIN, RANDOM: INSULIN: 12.7 u[IU]/mL (ref 2.6–24.9)

## 2016-12-30 LAB — TSH: TSH: 1.71 u[IU]/mL (ref 0.450–4.500)

## 2016-12-30 LAB — T3: T3 TOTAL: 105 ng/dL (ref 71–180)

## 2017-01-10 ENCOUNTER — Telehealth: Payer: Self-pay | Admitting: Internal Medicine

## 2017-01-10 NOTE — Telephone Encounter (Signed)
She just had a whole bunch of labs drawn by the healthy weight [facility] wellness program (earlier this month).  It seems as though they will be following up with her, and they plan to follow some of the labs (they specifically mentioned the Vitamin D in the note).  Therefore, I don't want to enter future orders now, as I won't know which labs are actually needed.  She shouldn't be scheduled for labs prior at this time.  She can call a week or two in advance of her appointment for me to review to see if anything is in fact needed, and if nonfasting labs are needed only, we can do at her appointment.  If fasting labs are needed, we can then schedule lab visit (vs having her return afterwards for a fasting lab visit, and discuss ordered labs at her physical).

## 2017-01-10 NOTE — Telephone Encounter (Signed)
Pt was rescheduled for her cpe due to you being gone on vacation and she has scheduled her cpe for an afternoon but has been put on nurse visit for labs a couple days prior. Can you put in future orders please

## 2017-01-10 NOTE — Telephone Encounter (Signed)
Called and left a long detailed message that pt will need to call 1-2 weeks prior to her cpe in June 2019 to see if fasting labs are needed or wait until her afternoon cpe appt to see what is needed. I have canceled the lab visit for now for next june

## 2017-01-11 ENCOUNTER — Ambulatory Visit (INDEPENDENT_AMBULATORY_CARE_PROVIDER_SITE_OTHER): Payer: BC Managed Care – PPO | Admitting: Family Medicine

## 2017-01-11 VITALS — BP 107/71 | HR 59 | Temp 98.5°F | Ht 66.0 in | Wt 243.0 lb

## 2017-01-11 DIAGNOSIS — E669 Obesity, unspecified: Secondary | ICD-10-CM

## 2017-01-11 DIAGNOSIS — K5909 Other constipation: Secondary | ICD-10-CM | POA: Diagnosis not present

## 2017-01-11 DIAGNOSIS — I10 Essential (primary) hypertension: Secondary | ICD-10-CM | POA: Diagnosis not present

## 2017-01-11 DIAGNOSIS — E8881 Metabolic syndrome: Secondary | ICD-10-CM

## 2017-01-11 DIAGNOSIS — E559 Vitamin D deficiency, unspecified: Secondary | ICD-10-CM | POA: Diagnosis not present

## 2017-01-11 DIAGNOSIS — IMO0001 Reserved for inherently not codable concepts without codable children: Secondary | ICD-10-CM

## 2017-01-11 DIAGNOSIS — E784 Other hyperlipidemia: Secondary | ICD-10-CM | POA: Diagnosis not present

## 2017-01-11 DIAGNOSIS — Z6839 Body mass index (BMI) 39.0-39.9, adult: Secondary | ICD-10-CM

## 2017-01-11 DIAGNOSIS — E7849 Other hyperlipidemia: Secondary | ICD-10-CM

## 2017-01-11 MED ORDER — POLYETHYLENE GLYCOL 3350 17 GM/SCOOP PO POWD: 17.0000 g | Freq: Every day | ORAL | 0 refills | Status: DC

## 2017-01-12 NOTE — Progress Notes (Signed)
Office: [phone]  /  Fax: [phone]   HPI:   Chief Complaint: OBESITY Anita Taylor is here to discuss her progress with her obesity treatment plan. She is on the  follow the Category 2 plan and is following her eating plan approximately 97 % of the time. She states she is exercising 0 minutes 0 times per week. Anita Taylor has done very well with weight loss in the past 2 weeks. She made some substitutions to her plan and didn't always eat all the required food. Her weight is 243 lb (110.2 kg) today and has had a weight loss of 10 pounds over a period of 2 weeks since her last visit. She has lost 10 lbs since starting treatment with [place].  Vitamin D deficiency Anita Taylor has a diagnosis of vitamin D deficiency. She is currently on vit D but forgot to pick up prescription. She admits fatigue and denies nausea, vomiting or muscle weakness.  Hypertension Anita Taylor is a 51 y.o. female with hypertension. She started feeling dizzy and stopped her Propranolol  Anita Taylor denies chest pain or shortness of breath on exertion. She is working weight loss to help control her blood pressure with the goal of decreasing her risk of heart attack and stroke. [name] blood pressure is currently controlled.  Hyperlipidemia Anita Taylor has hyperlipidemia and has been trying to improve her cholesterol levels with intensive lifestyle modification including a low saturated fat diet, exercise and weight loss. Her LDL improved from earlier and she is not currently on a statin. She denies any chest pain, claudication or myalgias.  Insulin Resistance [name] has a new diagnosis of insulin resistance based on her elevated fasting insulin level >5. Although Anita Taylor's blood glucose readings are still under good control, insulin resistance puts her at greater risk of metabolic syndrome and diabetes. Polyphagia is improved on diet prescription. She is not taking metformin currently and continues to work on diet and exercise to decrease risk  of diabetes.  Constipation, Chronic Anita Taylor notes constipation for the last few weeks, worse since attempting weight loss. She states BM have decreased to every 5 to 6 days and are not hard and painful. She denies hematochezia or melena.   ALLERGIES: No Known Allergies  MEDICATIONS: Current Outpatient Prescriptions on File Prior to Visit  Medication Sig Dispense Refill   Cholecalciferol (VITAMIN D-3) 1000 units CAPS Take 1 capsule by mouth daily.     Multiple Vitamins-Minerals (MULTIVITAMIN WITH MINERALS) tablet Take 1 tablet by mouth daily.     SUMAtriptan (IMITREX) 100 MG tablet TAKE 1 TABLET BY MOUTH 1 TIME. MAY REPEAT IN 2 HOURS IF HEADACHE PERSISTS OR RECURS. MAX 2 TABLETS PER 24 HOURS 9 tablet 11   vitamin B-12 (CYANOCOBALAMIN) 500 MCG tablet Take 500 mcg by mouth daily.     Vitamin D, Ergocalciferol, (DRISDOL) 50000 units CAPS capsule Take 1 capsule (50,000 Units total) by mouth every 7 (seven) days. 12 capsule 0   propranolol ER (INDERAL LA) 60 MG 24 hr capsule Take 1 capsule (60 mg total) by mouth daily. (Patient not taking: Reported on 01/11/2017) 30 capsule 11   No current facility-administered medications on file prior to visit.     PAST MEDICAL HISTORY: Past Medical History:  Diagnosis Date   Constipation    Migraine, menstrual    Vitamin D deficiency     PAST SURGICAL HISTORY: Past Surgical History:  Procedure Laterality Date   CESAREAN SECTION     x1   ENDOMETRIAL ABLATION  08  KELOID EXCISION     ears   TUBAL LIGATION      SOCIAL HISTORY: Social History  Substance Use Topics   Smoking status: Never Smoker   Smokeless tobacco: Never Used   Alcohol use 0.6 oz/week    1 Glasses of wine per week     Comment: a glass of wine every 3-6 months.    FAMILY HISTORY: Family History  Problem Relation Age of Onset   Hypertension Father    Hyperlipidemia Father    Obesity Father    Migraines Sister    Hypertension Mother    Obesity Mother     Diabetes Maternal Grandmother    Cancer Maternal Grandfather        prostate cancer (65)    ROS: Review of Systems  Constitutional: Positive for malaise/fatigue and weight loss.  Respiratory: Negative for shortness of breath (on exertion).   Cardiovascular: Negative for chest pain and claudication.  Gastrointestinal: Positive for constipation. Negative for melena, nausea and vomiting.       Negative hematochezia  Musculoskeletal: Negative for myalgias.       Negative muscle weakness  Neurological: Positive for dizziness.    PHYSICAL EXAM: Blood pressure 107/71, pulse (!) 59, temperature 98.5 F (36.9 C), height  (1.676 m), weight 243 lb (110.2 kg), SpO2 99 %. Body mass index is 39.22 kg/m. Physical Exam  Constitutional: She is oriented to person, place, and time. She appears well-developed and well-nourished.  Cardiovascular: Normal rate.   Pulmonary/Chest: Effort normal.  Musculoskeletal: Normal range of motion.  Neurological: She is oriented to person, place, and time.  Skin: Skin is warm and dry.  Psychiatric: She has a normal mood and affect. Her behavior is normal.  Vitals reviewed.   RECENT LABS AND TESTS: BMET    Component Value Date/Time   NA 142 12/29/2016 1039   K 4.7 12/29/2016 1039   CL 105 12/29/2016 1039   CO2 25 12/29/2016 1039   GLUCOSE 93 12/29/2016 1039   GLUCOSE 82 09/18/2015 0903   BUN 12 12/29/2016 1039   CREATININE 0.61 12/29/2016 1039   CREATININE 0.70 09/18/2015 0903   CALCIUM 9.2 12/29/2016 1039   GFRNONAA 107 12/29/2016 1039   GFRAA 123 12/29/2016 1039   Lab Results  Component Value Date   HGBA1C 5.6 12/29/2016   Lab Results  Component Value Date   INSULIN 12.7 12/29/2016   CBC    Component Value Date/Time   WBC 5.5 12/29/2016 1039   WBC 6.3 09/18/2015 0903   RBC 4.01 12/29/2016 1039   RBC 4.05 09/18/2015 0903   HGB 12.2 12/29/2016 1039   HCT 37.0 12/29/2016 1039   PLT 259 09/18/2015 0903   MCV 92 12/29/2016 1039    MCH 30.4 12/29/2016 1039   MCH 30.9 09/18/2015 0903   MCHC 33.0 12/29/2016 1039   MCHC 33.2 09/18/2015 0903   RDW 14.2 12/29/2016 1039   LYMPHSABS 1.6 12/29/2016 1039   MONOABS 441 09/18/2015 0903   EOSABS 0.1 12/29/2016 1039   BASOSABS 0.0 12/29/2016 1039   Iron/TIBC/Ferritin/ %Sat No results found for: IRON, TIBC, FERRITIN, IRONPCTSAT Lipid Panel     Component Value Date/Time   CHOL 171 12/29/2016 1039   TRIG 84 12/29/2016 1039   HDL 46 12/29/2016 1039   CHOLHDL 3.5 09/23/2016 0927   VLDL 15 09/23/2016 0927   LDLCALC 108 (H) 12/29/2016 1039   Hepatic Function Panel     Component Value Date/Time   PROT 7.1 12/29/2016 1039  ALBUMIN 4.2 12/29/2016 1039   AST 18 12/29/2016 1039   ALT 14 12/29/2016 1039   ALKPHOS 76 12/29/2016 1039   BILITOT 0.3 12/29/2016 1039      Component Value Date/Time   TSH 1.710 12/29/2016 1039   TSH 1.16 09/23/2016 0927   TSH 1.35 09/18/2015 0903    ASSESSMENT AND PLAN: Chronic constipation - Plan: polyethylene glycol powder (GLYCOLAX/MIRALAX) powder  Vitamin D deficiency  Other hyperlipidemia  Insulin resistance  Essential hypertension  Class 2 obesity with serious comorbidity and body mass index (BMI) of 39.0 to 39.9 in adult, unspecified obesity type  PLAN:  Vitamin D Deficiency Kameren was informed that low vitamin D levels contributes to fatigue and are associated with obesity, breast, and colon cancer. She was encouraged to start prescription Vit D again and we will re-check labs in 2 months and will follow up for routine testing of vitamin D, at least 2-3 times per year. She was informed of the risk of over-replacement of vitamin D and agrees to not increase her dose unless he discusses this with [place] first. Kathlene agrees to follow up with our clinic in 4 weeks.  Hypertension Analicia's blood pressure is still good off Propranolol. We discussed sodium restriction, working on healthy weight loss, and a regular exercise program as the  means to achieve improved blood pressure control. Faithann agreed with this plan and agreed to follow up as directed. We will continue to monitor her blood pressure as well as her progress with the above lifestyle modifications. She will continue her medications as prescribed and will watch for signs of hypotension as she continues her lifestyle modifications. We will re-check blood pressure in 4 weeks  Hyperlipidemia Janasha was informed of the American Heart Association Guidelines emphasizing intensive lifestyle modifications as the first line treatment for hyperlipidemia. We discussed many lifestyle modifications today in depth, and Chaniyah will continue to work on decreasing saturated fats such as fatty red meat, butter and many fried foods. She will also increase vegetables and lean protein in her diet and continue to work on exercise and weight loss efforts. Marnisha agrees to follow up with our clinic in 4 weeks and our dietician in 2 weeks where we will re-check labs at that time.  Insulin Resistance Deena will continue to work on weight loss, exercise, and decreasing simple carbohydrates in her diet to help decrease the risk of diabetes. She was informed that eating too many simple carbohydrates or too many calories at one sitting increases the likelihood of GI side effects. We will re-check labs in 2 weeks and Lauralyn agreed to follow up with [place] as directed to monitor her progress.  Constipation, Chronic Roselinda was informed decrease bowel movement frequency is normal while losing weight, but stools should not be hard or painful. She was advised to increase her H20 intake and work on increasing her fiber intake. High fiber foods were discussed today. Kareemah agrees to start Miralax 17 grams daily for 1 month and will follow up at the agreed upon time.  Obesity Alaa is currently in the action stage of change. As such, her goal is to continue with weight loss efforts She has agreed to follow the Category 2 plan Jelicia  has been instructed to work up to a goal of 150 minutes of combined cardio and strengthening exercise per week for weight loss and overall health benefits. We discussed the following Behavioral Modification Strategies today: no skipping meals, increasing lean protein intake and decreasing simple carbohydrates  Sharline has agreed to follow up with our clinic in 4 weeks and with our dietician in 2 weeks. She was informed of the importance of frequent follow up visits to maximize her success with intensive lifestyle modifications for her multiple health conditions.  I, [place] Crane, am acting as transcriptionist for Quillian Quince, MD  I have reviewed the above documentation for accuracy and completeness, and I agree with the above. -Quillian Quince, MD   OBESITY BEHAVIORAL INTERVENTION VISIT  Today's visit was # 2 out of 22.  Starting weight: 253 lbs Starting date: 12/29/16 Today's weight : 243 lbs  Today's date: 01/11/2017 Total lbs lost to date: 10  (Patients must lose 7 lbs in the first 6 months to continue with counseling)   ASK: We discussed the diagnosis of obesity with Wilmer Floor Wages today and Taegan agreed to give [place] permission to discuss obesity behavioral modification therapy today.  ASSESS: Bianney has the diagnosis of obesity and her BMI today is 39.24 Jonica is in the action stage of change   ADVISE: Laylah was educated on the multiple health risks of obesity as well as the benefit of weight loss to improve her health. She was advised of the need for long term treatment and the importance of lifestyle modifications.  AGREE: Multiple dietary modification options and treatment options were discussed and  Trine agreed to follow the Category 2 plan We discussed the following Behavioral Modification Strategies today: no skipping meals, increasing lean protein intake and decreasing simple carbohydrates

## 2017-01-25 ENCOUNTER — Ambulatory Visit (INDEPENDENT_AMBULATORY_CARE_PROVIDER_SITE_OTHER): Payer: BC Managed Care – PPO | Admitting: Dietician

## 2017-01-25 VITALS — Ht 66.0 in | Wt 240.0 lb

## 2017-01-25 DIAGNOSIS — Z6838 Body mass index (BMI) 38.0-38.9, adult: Secondary | ICD-10-CM | POA: Diagnosis not present

## 2017-01-25 DIAGNOSIS — E8881 Metabolic syndrome: Secondary | ICD-10-CM | POA: Diagnosis not present

## 2017-01-25 NOTE — Progress Notes (Signed)
  Office: [phone]  /  Fax: [phone]     Shelley has a diagnosis of insulin resistance based on her elevated fasting insulin level >5. Although Zoila's blood glucose readings are still under good control, insulin resistance puts her at greater risk of metabolic syndrome and diabetes.   Kashay is also here to discuss her obesity treatment plan.   Patient was educated about food nutrients ie protein, fats, simple and complex carbohydrates and how these affect insulin response. Focus on portion control,  avoiding simple carbohydrates and lower fat foods for ongoing wt loss efforts and glucose management  Gurtha is on the following meal plan category 2 approximately 90% of the time and has had a weight loss of 13 lbs since starting treatment with [place]. She states her constipation is under control with her OTC magnesium supplement she takes twice daily. Also reviewed importance of fruits and vegetables on her meal plan.  Her meal plan was individualized for maximum benefit. She reports she is doing well with the Category 2 meal plan at breakfast and dinner however is deviating significantly  at lunch. Gerianne was given instructions on journaling for her lunch meal. She was educated on entering her foods into My [employer]itness Pal for lunch with a calorie goal of 350-550 and 30 + grams of protein.  Also discussed at length the following behavioral modifications to help maximize  success increasing lean protein intake, decreasing simple carbohydrates, increasing vegetables, increase water intake, increasing high fiber food, temptations, planning for success, keeping a strict food journal st lunch.   Negin has been instructed to work up to a goal of 150 minutes of combined cardio and strengthening exercise per week for weight loss and overall health benefits. Written information was provided and the following handouts were given:  Journaling instructions,high protein low calorie foods.    OBESITY BEHAVIORAL  INTERVENTION VISIT  Today's visit was # 3 out of 22.  Starting weight: 253 lbs Starting date: 12/29/16 Today's weight : Weight: 240 lb (108.9 kg)  Today's date: 01/25/2017 Total lbs lost to date: 13 lbs (Patients must lose 7 lbs in the first 6 months to continue with counseling)   ASK: We discussed the diagnosis of obesity with Wilmer Floor Duet today and Kyleigh agreed to give [place] permission to discuss obesity behavioral modification therapy today.  ASSESS: Alaysha has the diagnosis of obesity and her BMI today is 38.76  Morganne is in the action stage of change   ADVISE: Sharnise was educated on the multiple health risks of obesity as well as the benefit of weight loss to improve her health. She was advised of the need for long term treatment and the importance of lifestyle modifications.  AGREE: Multiple dietary modification options and treatment options were discussed and  Marceline agreed to continue category 2 meal plan for breakfast and dinner and journal her lunch meal, 350-550 calories and 30+ grams of protein. We discussed the following Behavioral Modification Stratagies today: increasing lean protein intake, increasing vegetables and increasing lower sugar fruits

## 2017-02-04 ENCOUNTER — Other Ambulatory Visit: Payer: Self-pay | Admitting: Family Medicine

## 2017-02-04 DIAGNOSIS — E559 Vitamin D deficiency, unspecified: Secondary | ICD-10-CM

## 2017-02-08 ENCOUNTER — Ambulatory Visit (INDEPENDENT_AMBULATORY_CARE_PROVIDER_SITE_OTHER): Payer: BC Managed Care – PPO | Admitting: Family Medicine

## 2017-02-08 VITALS — BP 106/72 | HR 51 | Temp 97.6°F | Ht 66.0 in | Wt 237.0 lb

## 2017-02-08 DIAGNOSIS — E559 Vitamin D deficiency, unspecified: Secondary | ICD-10-CM

## 2017-02-08 DIAGNOSIS — Z6838 Body mass index (BMI) 38.0-38.9, adult: Secondary | ICD-10-CM | POA: Diagnosis not present

## 2017-02-08 MED ORDER — VITAMIN D (ERGOCALCIFEROL) 1.25 MG (50000 UNIT) PO CAPS: 50000.0000 [IU] | ORAL_CAPSULE | ORAL | 0 refills | Status: DC

## 2017-02-08 NOTE — Progress Notes (Signed)
Office: [phone]  /  Fax: [phone]   HPI:   Chief Complaint: OBESITY Anita Taylor is here to discuss her progress with her obesity treatment plan. She is on the  follow the Category 2 plan and is following her eating plan approximately 80 % of the time. She states she is exercising walking 30 minutes 2-3 times per week. Sai continues to do well with weight loss. She states she is still struggling to eat all of her protein. She still has some night time cravings but she is doing better with avoiding temptations.  Her weight is 237 lb (107.5 kg) today and has had a weight loss of 3 pounds over a period of 2 weeks since her last visit. She has lost 16 lbs since starting treatment with [place].  Vitamin D deficiency Anita Taylor has a diagnosis of vitamin D deficiency. She is not yet at goal. She is currently taking prescription Vit D and denies nausea, vomiting or muscle weakness.  ALLERGIES: No Known Allergies  MEDICATIONS: Current Outpatient Prescriptions on File Prior to Visit  Medication Sig Dispense Refill  . Cholecalciferol (VITAMIN D-3) 1000 units CAPS Take 1 capsule by mouth daily.    . magnesium oxide (MAG-OX) 400 MG tablet Take 400 mg by mouth daily.    . Multiple Vitamins-Minerals (MULTIVITAMIN WITH MINERALS) tablet Take 1 tablet by mouth daily.    . SUMAtriptan (IMITREX) 100 MG tablet TAKE 1 TABLET BY MOUTH 1 TIME. MAY REPEAT IN 2 HOURS IF HEADACHE PERSISTS OR RECURS. MAX 2 TABLETS PER 24 HOURS 9 tablet 11  . vitamin B-12 (CYANOCOBALAMIN) 500 MCG tablet Take 500 mcg by mouth daily.     No current facility-administered medications on file prior to visit.     PAST MEDICAL HISTORY: Past Medical History:  Diagnosis Date  . Constipation   . Migraine, menstrual   . Vitamin D deficiency     PAST SURGICAL HISTORY: Past Surgical History:  Procedure Laterality Date  . CESAREAN SECTION     x1  . ENDOMETRIAL ABLATION  08  . KELOID EXCISION     ears  . TUBAL LIGATION      SOCIAL  HISTORY: Social History  Substance Use Topics  . Smoking status: Never Smoker  . Smokeless tobacco: Never Used  . Alcohol use 0.6 oz/week    1 Glasses of wine per week     Comment: a glass of wine every 3-6 months.    FAMILY HISTORY: Family History  Problem Relation Age of Onset  . Hypertension Father   . Hyperlipidemia Father   . Obesity Father   . Migraines Sister   . Hypertension Mother   . Obesity Mother   . Diabetes Maternal Grandmother   . Cancer Maternal Grandfather        prostate cancer (65)    ROS: Review of Systems  Constitutional: Positive for weight loss.  Gastrointestinal: Negative for nausea and vomiting.  Musculoskeletal:       Negative muscle weakness    PHYSICAL EXAM: Blood pressure 106/72, pulse (!) 51, temperature 97.6 F (36.4 C), temperature source Oral, height  (1.676 m), weight 237 lb (107.5 kg), SpO2 98 %. Body mass index is 38.25 kg/m. Physical Exam  Constitutional: She is oriented to person, place, and time. She appears well-developed and well-nourished.  Cardiovascular: Normal rate.   Pulmonary/Chest: Effort normal.  Musculoskeletal: Normal range of motion.  Neurological: She is oriented to person, place, and time.  Skin: Skin is warm and dry.  Psychiatric: She has a normal mood and affect. Her behavior is normal.  Vitals reviewed.   RECENT LABS AND TESTS: BMET    Component Value Date/Time   NA 142 12/29/2016 1039   K 4.7 12/29/2016 1039   CL 105 12/29/2016 1039   CO2 25 12/29/2016 1039   GLUCOSE 93 12/29/2016 1039   GLUCOSE 82 09/18/2015 0903   BUN 12 12/29/2016 1039   CREATININE 0.61 12/29/2016 1039   CREATININE 0.70 09/18/2015 0903   CALCIUM 9.2 12/29/2016 1039   GFRNONAA 107 12/29/2016 1039   GFRAA 123 12/29/2016 1039   Lab Results  Component Value Date   HGBA1C 5.6 12/29/2016   Lab Results  Component Value Date   INSULIN 12.7 12/29/2016   CBC    Component Value Date/Time   WBC 5.5 12/29/2016 1039   WBC  6.3 09/18/2015 0903   RBC 4.01 12/29/2016 1039   RBC 4.05 09/18/2015 0903   HGB 12.2 12/29/2016 1039   HCT 37.0 12/29/2016 1039   PLT 259 09/18/2015 0903   MCV 92 12/29/2016 1039   MCH 30.4 12/29/2016 1039   MCH 30.9 09/18/2015 0903   MCHC 33.0 12/29/2016 1039   MCHC 33.2 09/18/2015 0903   RDW 14.2 12/29/2016 1039   LYMPHSABS 1.6 12/29/2016 1039   MONOABS 441 09/18/2015 0903   EOSABS 0.1 12/29/2016 1039   BASOSABS 0.0 12/29/2016 1039   Iron/TIBC/Ferritin/ %Sat No results found for: IRON, TIBC, FERRITIN, IRONPCTSAT Lipid Panel     Component Value Date/Time   CHOL 171 12/29/2016 1039   TRIG 84 12/29/2016 1039   HDL 46 12/29/2016 1039   CHOLHDL 3.5 09/23/2016 0927   VLDL 15 09/23/2016 0927   LDLCALC 108 (H) 12/29/2016 1039   Hepatic Function Panel     Component Value Date/Time   PROT 7.1 12/29/2016 1039   ALBUMIN 4.2 12/29/2016 1039   AST 18 12/29/2016 1039   ALT 14 12/29/2016 1039   ALKPHOS 76 12/29/2016 1039   BILITOT 0.3 12/29/2016 1039      Component Value Date/Time   TSH 1.710 12/29/2016 1039   TSH 1.16 09/23/2016 0927   TSH 1.35 09/18/2015 0903    ASSESSMENT AND PLAN: Vitamin D deficiency - Plan: Vitamin D, Ergocalciferol, (DRISDOL) 50000 units CAPS capsule  Class 2 severe obesity with serious comorbidity and body mass index (BMI) of 38.0 to 38.9 in adult, unspecified obesity type (HCC)  PLAN:  Vitamin D Deficiency Anita Taylor was informed that low vitamin D levels contributes to fatigue and are associated with obesity, breast, and colon cancer. She agrees to continue taking prescription Vit D ,000 IU every week and we will refill for 1 month. She will follow up for routine testing of vitamin D, at least 2-3 times per year. She was informed of the risk of over-replacement of vitamin D and agrees to not increase her dose unless he discusses this with [place] first. Dineen agrees to follow up with our clinic in 2 weeks.  Obesity Anita Taylor is currently in the action stage of  change. As such, her goal is to continue with weight loss efforts She has agreed to change to keep a food journal with 350-550 calories and 30+ grams of protein at lunch Nikisha has been instructed to work up to a goal of 150 minutes of combined cardio and strengthening exercise per week or start increasing exercise to 15 minutes 5 times per week for weight loss and overall health benefits. We discussed the following Behavioral Modification Strategies today: increasing  lean protein intake, decreasing simple carbohydrates, holiday eating strategies, no skipping meals, and celebration eating strategies    Anita Taylor has agreed to follow up with our clinic in 2 weeks. She was informed of the importance of frequent follow up visits to maximize her success with intensive lifestyle modifications for her multiple health conditions.  I, Burt Knack, am acting as transcriptionist for Quillian Quince, MD  I have reviewed the above documentation for accuracy and completeness, and I agree with the above. -Quillian Quince, MD     Today's visit was # 3 out of 22.  Starting weight: 253 lbs Starting date: 12/29/16 Today's weight : 237 lbs  Today's date: 02/08/2017 Total lbs lost to date: 16 (Patients must lose 7 lbs in the first 6 months to continue with counseling)   ASK: We discussed the diagnosis of obesity with Anita Taylor today and Shaunda agreed to give [place] permission to discuss obesity behavioral modification therapy today.  ASSESS: Aniqua has the diagnosis of obesity and her BMI today is 14 Elani is in the action stage of change   ADVISE: Adelei was educated on the multiple health risks of obesity as well as the benefit of weight loss to improve her health. She was advised of the need for long term treatment and the importance of lifestyle modifications.  AGREE: Multiple dietary modification options and treatment options were discussed and  Caelen agreed to keep a food journal with 350-550 calories and 30+  grams of protein at lunch  We discussed the following Behavioral Modification Strategies today: increasing lean protein intake, decreasing simple carbohydrates, holiday eating strategies, no skipping meals, and celebration eating strategies.

## 2017-02-22 ENCOUNTER — Ambulatory Visit (INDEPENDENT_AMBULATORY_CARE_PROVIDER_SITE_OTHER): Payer: BC Managed Care – PPO | Admitting: Family Medicine

## 2017-02-22 VITALS — BP 109/72 | HR 54 | Temp 97.9°F | Ht 66.0 in | Wt 236.0 lb

## 2017-02-22 DIAGNOSIS — E559 Vitamin D deficiency, unspecified: Secondary | ICD-10-CM

## 2017-02-22 DIAGNOSIS — Z6838 Body mass index (BMI) 38.0-38.9, adult: Secondary | ICD-10-CM | POA: Diagnosis not present

## 2017-02-22 MED ORDER — VITAMIN D (ERGOCALCIFEROL) 1.25 MG (50000 UNIT) PO CAPS: 50000.0000 [IU] | ORAL_CAPSULE | ORAL | 0 refills | Status: DC

## 2017-02-22 NOTE — Progress Notes (Signed)
Office: [phone]  /  Fax: [phone]   HPI:   Chief Complaint: OBESITY Anita Taylor is here to discuss her progress with her obesity treatment plan. She is on the Category 2 plan and is following her eating plan approximately 90 % of the time. She states she is walking for 30 minutes 3 times per week. Anita Taylor continues to do well with weight loss on the Category 2 plan but her schedule changed and she struggled with planning meals ahead of time and had to increase eating out. She is ready to get back on track.  Her weight is 236 lb (107 kg) today and has had a weight loss of 1 pound over a period of 2 weeks since her last visit. She has lost 17 lbs since starting treatment with [place].  Vitamin D deficiency Anita Taylor has a diagnosis of vitamin D deficiency. She is stable on prescription Vit D, bust she is not yet at goal. She denies nausea, vomiting or muscle weakness.  ALLERGIES: No Known Allergies  MEDICATIONS: Current Outpatient Prescriptions on File Prior to Visit  Medication Sig Dispense Refill  . Cholecalciferol (VITAMIN D-3) 1000 units CAPS Take 1 capsule by mouth daily.    . magnesium oxide (MAG-OX) 400 MG tablet Take 400 mg by mouth daily.    . Multiple Vitamins-Minerals (MULTIVITAMIN WITH MINERALS) tablet Take 1 tablet by mouth daily.    . SUMAtriptan (IMITREX) 100 MG tablet TAKE 1 TABLET BY MOUTH 1 TIME. MAY REPEAT IN 2 HOURS IF HEADACHE PERSISTS OR RECURS. MAX 2 TABLETS PER 24 HOURS 9 tablet 11  . vitamin B-12 (CYANOCOBALAMIN) 500 MCG tablet Take 500 mcg by mouth daily.     No current facility-administered medications on file prior to visit.     PAST MEDICAL HISTORY: Past Medical History:  Diagnosis Date  . Constipation   . Migraine, menstrual   . Vitamin D deficiency     PAST SURGICAL HISTORY: Past Surgical History:  Procedure Laterality Date  . CESAREAN SECTION     x1  . ENDOMETRIAL ABLATION  08  . KELOID EXCISION     ears  . TUBAL LIGATION      SOCIAL  HISTORY: Social History  Substance Use Topics  . Smoking status: Never Smoker  . Smokeless tobacco: Never Used  . Alcohol use 0.6 oz/week    1 Glasses of wine per week     Comment: a glass of wine every 3-6 months.    FAMILY HISTORY: Family History  Problem Relation Age of Onset  . Hypertension Father   . Hyperlipidemia Father   . Obesity Father   . Migraines Sister   . Hypertension Mother   . Obesity Mother   . Diabetes Maternal Grandmother   . Cancer Maternal Grandfather        prostate cancer (65)    ROS: Review of Systems  Constitutional: Positive for weight loss.  Gastrointestinal: Negative for nausea and vomiting.  Musculoskeletal:       Negative muscle weakness    PHYSICAL EXAM: Blood pressure 109/72, pulse (!) 54, temperature 97.9 F (36.6 C), temperature source Oral, height 5\' 6"  (1.676 m), weight 236 lb (107 kg), SpO2 98 %. Body mass index is 38.09 kg/m. Physical Exam  Constitutional: She is oriented to person, place, and time. She appears well-developed and well-nourished.  Cardiovascular: Normal rate.   Pulmonary/Chest: Effort normal.  Musculoskeletal: Normal range of motion.  Neurological: She is oriented to person, place, and time.  Skin: Skin is warm  and dry.  Psychiatric: She has a normal mood and affect. Her behavior is normal.  Vitals reviewed.   RECENT LABS AND TESTS: BMET    Component Value Date/Time   NA 142 12/29/2016 1039   K 4.7 12/29/2016 1039   CL 105 12/29/2016 1039   CO2 25 12/29/2016 1039   GLUCOSE 93 12/29/2016 1039   GLUCOSE 82 09/18/2015 0903   BUN 12 12/29/2016 1039   CREATININE 0.61 12/29/2016 1039   CREATININE 0.70 09/18/2015 0903   CALCIUM 9.2 12/29/2016 1039   GFRNONAA 107 12/29/2016 1039   GFRAA 123 12/29/2016 1039   Lab Results  Component Value Date   HGBA1C 5.6 12/29/2016   Lab Results  Component Value Date   INSULIN 12.7 12/29/2016   CBC    Component Value Date/Time   WBC 5.5 12/29/2016 1039   WBC  6.3 09/18/2015 0903   RBC 4.01 12/29/2016 1039   RBC 4.05 09/18/2015 0903   HGB 12.2 12/29/2016 1039   HCT 37.0 12/29/2016 1039   PLT 259 09/18/2015 0903   MCV 92 12/29/2016 1039   MCH 30.4 12/29/2016 1039   MCH 30.9 09/18/2015 0903   MCHC 33.0 12/29/2016 1039   MCHC 33.2 09/18/2015 0903   RDW 14.2 12/29/2016 1039   LYMPHSABS 1.6 12/29/2016 1039   MONOABS 441 09/18/2015 0903   EOSABS 0.1 12/29/2016 1039   BASOSABS 0.0 12/29/2016 1039   Iron/TIBC/Ferritin/ %Sat No results found for: IRON, TIBC, FERRITIN, IRONPCTSAT Lipid Panel     Component Value Date/Time   CHOL 171 12/29/2016 1039   TRIG 84 12/29/2016 1039   HDL 46 12/29/2016 1039   CHOLHDL 3.5 09/23/2016 0927   VLDL 15 09/23/2016 0927   LDLCALC 108 (H) 12/29/2016 1039   Hepatic Function Panel     Component Value Date/Time   PROT 7.1 12/29/2016 1039   ALBUMIN 4.2 12/29/2016 1039   AST 18 12/29/2016 1039   ALT 14 12/29/2016 1039   ALKPHOS 76 12/29/2016 1039   BILITOT 0.3 12/29/2016 1039      Component Value Date/Time   TSH 1.710 12/29/2016 1039   TSH 1.16 09/23/2016 0927   TSH 1.35 09/18/2015 0903    ASSESSMENT AND PLAN: Vitamin D deficiency - Plan: Vitamin D, Ergocalciferol, (DRISDOL) 50000 units CAPS capsule  Class 2 severe obesity with serious comorbidity and body mass index (BMI) of 38.0 to 38.9 in adult, unspecified obesity type (HCC)  PLAN:  Vitamin D Deficiency Anita Taylor was informed that low vitamin D levels contributes to fatigue and are associated with obesity, breast, and colon cancer. Anita Taylor agrees to continue taking prescription Vit D @50 ,000 IU every week #4 and we will refill for 1 month. She will follow up for routine testing of vitamin D, at least 2-3 times per year. She was informed of the risk of over-replacement of vitamin D and agrees to not increase her dose unless he discusses this with us first. Anita Taylor agrees to follow up with our clinic in 3 weeks.  Obesity Anita Taylor is currently in the action stage  of change. As such, her goal is to continue with weight loss efforts She has agreed to follow the Category 2 plan Anita Taylor has been instructed to work up to a goal of 150 minutes of combined cardio and strengthening exercise per week for weight loss and overall health benefits. We discussed the following Behavioral Modification Strategies today: decreasing simple carbohydrates, increasing vegetables and holiday eating strategies (halloween)   Anita Taylor has agreed to follow up with  our clinic in 3 weeks. She was informed of the importance of frequent follow up visits to maximize her success with intensive lifestyle modifications for her multiple health conditions.  I, Burt Knack, am acting as transcriptionist for Quillian Quince, MD  I have reviewed the above documentation for accuracy and completeness, and I agree with the above. -Quillian Quince, MD       Today's visit was # 4 out of 22.  Starting weight: 253 lbs  Starting date: 12/29/16  Today's weight : 236 lbs  Today's date: 02/22/2017 Total lbs lost to date: 17 (Patients must lose 7 lbs in the first 6 months to continue with counseling)   ASK: We discussed the diagnosis of obesity with Wilmer Floor Fayad today and Tonjua agreed to give [place] permission to discuss obesity behavioral modification therapy today.  ASSESS: Tylynn has the diagnosis of obesity and her BMI today is 38.11 Alana is in the action stage of change   ADVISE: Nelsie was educated on the multiple health risks of obesity as well as the benefit of weight loss to improve her health. She was advised of the need for long term treatment and the importance of lifestyle modifications.  AGREE: Multiple dietary modification options and treatment options were discussed and  Yahaira agreed to follow the Category 2 plan We discussed the following Behavioral Modification Strategies today: decreasing simple carbohydrates, increasing vegetables and holiday eating strategies (halloween)

## 2017-03-02 ENCOUNTER — Ambulatory Visit (INDEPENDENT_AMBULATORY_CARE_PROVIDER_SITE_OTHER): Payer: BC Managed Care – PPO | Admitting: Family Medicine

## 2017-03-09 ENCOUNTER — Encounter: Payer: Self-pay | Admitting: Family Medicine

## 2017-03-09 ENCOUNTER — Ambulatory Visit: Payer: BC Managed Care – PPO | Admitting: Family Medicine

## 2017-03-09 VITALS — BP 120/70 | HR 76 | Ht 66.0 in | Wt 246.0 lb

## 2017-03-09 DIAGNOSIS — K5900 Constipation, unspecified: Secondary | ICD-10-CM | POA: Diagnosis not present

## 2017-03-09 DIAGNOSIS — K644 Residual hemorrhoidal skin tags: Secondary | ICD-10-CM

## 2017-03-09 MED ORDER — HYDROCORTISONE 2.5 % RE CREA: 1.0000 "application " | TOPICAL_CREAM | Freq: Four times a day (QID) | RECTAL | 1 refills | Status: DC | PRN

## 2017-03-09 NOTE — Patient Instructions (Signed)
Continue to drink plenty of water. Get a high fiber diet.  If you can't get it from the foods in your diet (fruits/vegetables), then be sure to take a fiber supplement every day. If stools are hard, you can take a stool softener daily (and back off if they become too loose). Use the prescribed cream up to 4 times daily as needed for hemorrhoids.  Your imitrex prescription was sent by your neurologist to [facility] at [facility] and [address]--see if you can transfer this prescription to your preferred pharmacy (was sent for a year's worth of refills in August by Dr. Lucia Gaskins).    How to Take a Sitz Bath A sitz bath is a warm water bath that is taken while you are sitting down. The water should only come up to your hips and should cover your buttocks. Your health care provider may recommend a sitz bath to help you:  Clean the lower part of your body, including your genital area.  With itching.  With pain.  With sore muscles or muscles that tighten or spasm.  How to take a sitz bath Take 3-4 sitz baths per day or as told by your health care provider. 1. Partially fill a bathtub with warm water. You will only need the water to be deep enough to cover your hips and buttocks when you are sitting in it. 2. If your health care provider told you to put medicine in the water, follow the directions exactly. 3. Sit in the water and open the tub drain a little. 4. Turn on the warm water again to keep the tub at the correct level. Keep the water running constantly. 5. Soak in the water for 15-20 minutes or as told by your health care provider. 6. After the sitz bath, pat the affected area dry first. Do not rub it. 7. Be careful when you stand up after the sitz bath because you may feel dizzy.  Contact a health care provider if:  Your symptoms get worse. Do not continue with sitz baths if your symptoms get worse.  You have new symptoms. Do not continue with sitz baths until you talk with your health care  provider. This information is not intended to replace advice given to you by your health care provider. Make sure you discuss any questions you have with your health care provider. Document Released: 01/03/2004 Document Revised: 09/10/2015 Document Reviewed: 04/10/2014 Elsevier Interactive Patient Education  2018 [employer].  Hemorrhoids Hemorrhoids are swollen veins in and around the rectum or anus. There are two types of hemorrhoids:  Internal hemorrhoids. These occur in the veins that are just inside the rectum. They may poke through to the outside and become irritated and painful.  External hemorrhoids. These occur in the veins that are outside of the anus and can be felt as a painful swelling or hard lump near the anus.  Most hemorrhoids do not cause serious problems, and they can be managed with home treatments such as diet and lifestyle changes. If home treatments do not help your symptoms, procedures can be done to shrink or remove the hemorrhoids. What are the causes? This condition is caused by increased pressure in the anal area. This pressure may result from various things, including:  Constipation.  Straining to have a bowel movement.  Diarrhea.  Pregnancy.  Obesity.  Sitting for long periods of time.  Heavy lifting or other activity that causes you to strain.  Anal sex.  What are the signs or symptoms? Symptoms  of this condition include:  Pain.  Anal itching or irritation.  Rectal bleeding.  Leakage of stool (feces).  Anal swelling.  One or more lumps around the anus.  How is this diagnosed? This condition can often be diagnosed through a visual exam. Other exams or tests may also be done, such as:  Examination of the rectal area with a gloved hand (digital rectal exam).  Examination of the anal canal using a small tube (anoscope).  A blood test, if you have lost a significant amount of blood.  A test to look inside the colon (sigmoidoscopy or  colonoscopy).  How is this treated? This condition can usually be treated at home. However, various procedures may be done if dietary changes, lifestyle changes, and other home treatments do not help your symptoms. These procedures can help make the hemorrhoids smaller or remove them completely. Some of these procedures involve surgery, and others do not. Common procedures include:  Rubber band ligation. Rubber bands are placed at the base of the hemorrhoids to cut off the blood supply to them.  Sclerotherapy. Medicine is injected into the hemorrhoids to shrink them.  Infrared coagulation. A type of light energy is used to get rid of the hemorrhoids.  Hemorrhoidectomy surgery. The hemorrhoids are surgically removed, and the veins that supply them are tied off.  Stapled hemorrhoidopexy surgery. A circular stapling device is used to remove the hemorrhoids and use staples to cut off the blood supply to them.  Follow these instructions at home: Eating and drinking  Eat foods that have a lot of fiber in them, such as whole grains, beans, nuts, fruits, and vegetables. Ask your health care provider about taking products that have added fiber (fiber supplements).  Drink enough fluid to keep your urine clear or pale yellow. Managing pain and swelling  Take warm sitz baths for 20 minutes, 3-4 times a day to ease pain and discomfort.  If directed, apply ice to the affected area. Using ice packs between sitz baths may be helpful. ? Put ice in a plastic bag. ? Place a towel between your skin and the bag. ? Leave the ice on for 20 minutes, 2-3 times a day. General instructions  Take over-the-counter and prescription medicines only as told by your health care provider.  Use medicated creams or suppositories as told.  Exercise regularly.  Go to the bathroom when you have the urge to have a bowel movement. Do not wait.  Avoid straining to have bowel movements.  Keep the anal area dry and  clean. Use wet toilet paper or moist towelettes after a bowel movement.  Do not sit on the toilet for long periods of time. This increases blood pooling and pain. Contact a health care provider if:  You have increasing pain and swelling that are not controlled by treatment or medicine.  You have uncontrolled bleeding.  You have difficulty having a bowel movement, or you are unable to have a bowel movement.  You have pain or inflammation outside the area of the hemorrhoids. This information is not intended to replace advice given to you by your health care provider. Make sure you discuss any questions you have with your health care provider. Document Released: 04/09/2000 Document Revised: 09/10/2015 Document Reviewed: 12/25/2014 Elsevier Interactive Patient Education  2017 [employer]   Constipation, Adult Constipation is when a person has fewer bowel movements in a week than normal, has difficulty having a bowel movement, or has stools that are dry, hard, or larger  than normal. Constipation may be caused by an underlying condition. It may become worse with age if a person takes certain medicines and does not take in enough fluids. Follow these instructions at home: Eating and drinking   Eat foods that have a lot of fiber, such as fresh fruits and vegetables, whole grains, and beans.  Limit foods that are high in fat, low in fiber, or overly processed, such as french fries, hamburgers, cookies, candies, and soda.  Drink enough fluid to keep your urine clear or pale yellow. General instructions  Exercise regularly or as told by your health care provider.  Go to the restroom when you have the urge to go. Do not hold it in.  Take over-the-counter and prescription medicines only as told by your health care provider. These include any fiber supplements.  Practice pelvic floor retraining exercises, such as deep breathing while relaxing the lower abdomen and pelvic floor relaxation  during bowel movements.  Watch your condition for any changes.  Keep all follow-up visits as told by your health care provider. This is important. Contact a health care provider if:  You have pain that gets worse.  You have a fever.  You do not have a bowel movement after 4 days.  You vomit.  You are not hungry.  You lose weight.  You are bleeding from the anus.  You have thin, pencil-like stools. Get help right away if:  You have a fever and your symptoms suddenly get worse.  You leak stool or have blood in your stool.  Your abdomen is bloated.  You have severe pain in your abdomen.  You feel dizzy or you faint. This information is not intended to replace advice given to you by your health care provider. Make sure you discuss any questions you have with your health care provider. Document Released: 01/09/2004 Document Revised: 10/31/2015 Document Reviewed: 10/01/2015 Elsevier Interactive Patient Education  2017 [employer]

## 2017-03-09 NOTE — Progress Notes (Signed)
Chief Complaint  Patient presents with  . Hemorrhoids    thinks she may have a hemorrhoid. Has some soreness, no bleeding-since yesterday.    She has been having problems this week with constipation and straining.  Metamucil has helped, and has had a good bowel movement, after straining earlier in the week.  Denies any blood on toilet paper or in the stool, but she is feeling soreness and irritation in the rectal area.  She tried Preparation H and [employer]. It is less irritated overall. The bulge/swelling is still there.  [facility] [employer] [facility], having more protein than normal--thinks perhaps she isn't getting as much vegetables in her diet.  Has an apple daily.  She has been taking metamucil sporadically only, starting after introducing an OTC Mg supplement to help with constipation.  PMH, PSH, SH reviewed  Outpatient Encounter Medications as of 03/09/2017  Medication Sig Note  . Cholecalciferol (VITAMIN D-3) 1000 units CAPS Take 1 capsule by mouth daily.   . Multiple Vitamins-Minerals (MULTIVITAMIN WITH MINERALS) tablet Take 1 tablet by mouth daily.   . Vitamin D, Ergocalciferol, (DRISDOL) 50000 units CAPS capsule Take 1 capsule (50,000 Units total) by mouth every 7 (seven) days.   . hydrocortisone (ANUSOL-HC) 2.5 % rectal cream Place 1 application 4 (four) times daily as needed rectally for hemorrhoids or anal itching.   . SUMAtriptan (IMITREX) 100 MG tablet TAKE 1 TABLET BY MOUTH 1 TIME. MAY REPEAT IN 2 HOURS IF HEADACHE PERSISTS OR RECURS. MAX 2 TABLETS PER 24 HOURS (Patient not taking: Reported on 03/09/2017)   . [DISCONTINUED] magnesium oxide (MAG-OX) 400 MG tablet Take 400 mg by mouth daily.   . [DISCONTINUED] vitamin B-12 (CYANOCOBALAMIN) 500 MCG tablet Take 500 mcg by mouth daily. 08/14/2014: .   No facility-administered encounter medications on file as of 03/09/2017.    (anusol rx'd today, not using prior to visit)  No Known Allergies   ROS: Denies abdominal  pain, fever, chills, nausea, vomiting, diarrhea. No URI symptoms, cough, shortness of breath, chest pain. Moods are good. No bleeding, bruising, rash   PHYSICAL EXAM:  BP 120/70   Pulse 76   Ht 5\' 6"  (1.676 m)   Wt 246 lb (111.6 kg)   BMI 39.71 kg/m   Well appearing, pleasant female in no distress. Sitting comfortably Heart: regular rate and rhythm Lungs: clear bilaterally Abdomen: normal bowel sounds, soft, nontender, nondistended. Rectal exam: Two external hemorrhoids on the left side.  Soft, non-thrombosed, nontender. Digital rectal exam was normal, no masses, heme negative brown stool.   ASSESSMENT/PLAN:  External hemorrhoid - non-thrombosed, non-bleeding  Constipation, unspecified constipation type - reviewed supportive measures--encouraged increased fluid intake, high fiber diet, daily fiber supplement; stool softener prn.     Continue to drink plenty of water. Get a high fiber diet.  If you can't get it from the foods in your diet (fruits/vegetables), then be sure to take a fiber supplement every day. If stools are hard, you can take a stool softener daily (and back off if they become too loose).

## 2017-03-23 ENCOUNTER — Ambulatory Visit (INDEPENDENT_AMBULATORY_CARE_PROVIDER_SITE_OTHER): Payer: BC Managed Care – PPO | Admitting: Family Medicine

## 2017-04-12 ENCOUNTER — Other Ambulatory Visit: Payer: Self-pay | Admitting: Family Medicine

## 2017-04-12 ENCOUNTER — Telehealth: Payer: Self-pay | Admitting: Medical

## 2017-04-12 DIAGNOSIS — E559 Vitamin D deficiency, unspecified: Secondary | ICD-10-CM

## 2017-04-12 NOTE — Telephone Encounter (Signed)
Is this ok to refill?  

## 2017-04-12 NOTE — Telephone Encounter (Signed)
Vitamin D refill request came in.  I declined as it appears that the weight loss clinic is directing this currently

## 2017-04-14 ENCOUNTER — Other Ambulatory Visit: Payer: Self-pay | Admitting: Family Medicine

## 2017-04-14 DIAGNOSIS — E559 Vitamin D deficiency, unspecified: Secondary | ICD-10-CM

## 2017-04-28 NOTE — Telephone Encounter (Signed)
This is a medication decline. Forwarding to [facility].

## 2017-04-29 ENCOUNTER — Other Ambulatory Visit (INDEPENDENT_AMBULATORY_CARE_PROVIDER_SITE_OTHER): Payer: Self-pay | Admitting: Family Medicine

## 2017-04-29 ENCOUNTER — Encounter: Payer: Self-pay | Admitting: Neurology

## 2017-04-29 DIAGNOSIS — E559 Vitamin D deficiency, unspecified: Secondary | ICD-10-CM

## 2017-06-06 ENCOUNTER — Other Ambulatory Visit: Payer: Self-pay | Admitting: Neurology

## 2017-06-06 DIAGNOSIS — G43829 Menstrual migraine, not intractable, without status migrainosus: Secondary | ICD-10-CM

## 2017-08-29 ENCOUNTER — Other Ambulatory Visit: Payer: Self-pay | Admitting: Family Medicine

## 2017-08-29 DIAGNOSIS — Z1231 Encounter for screening mammogram for malignant neoplasm of breast: Secondary | ICD-10-CM

## 2017-09-26 ENCOUNTER — Ambulatory Visit
Admission: RE | Admit: 2017-09-26 | Discharge: 2017-09-26 | Disposition: A | Discharge status: Home / Self Care | Payer: BC Managed Care – PPO | Source: Ambulatory Visit | Attending: Family Medicine | Admitting: Family Medicine

## 2017-09-26 DIAGNOSIS — Z1231 Encounter for screening mammogram for malignant neoplasm of breast: Secondary | ICD-10-CM

## 2017-09-28 ENCOUNTER — Encounter: Payer: BC Managed Care – PPO | Admitting: Family Medicine

## 2017-10-03 ENCOUNTER — Other Ambulatory Visit: Payer: BC Managed Care – PPO

## 2017-10-06 ENCOUNTER — Encounter: Payer: BC Managed Care – PPO | Admitting: Family Medicine

## 2017-10-17 ENCOUNTER — Other Ambulatory Visit: Payer: BC Managed Care – PPO

## 2017-10-18 ENCOUNTER — Other Ambulatory Visit (INDEPENDENT_AMBULATORY_CARE_PROVIDER_SITE_OTHER): Payer: Self-pay | Admitting: Family Medicine

## 2017-10-18 DIAGNOSIS — E559 Vitamin D deficiency, unspecified: Secondary | ICD-10-CM

## 2017-10-19 ENCOUNTER — Encounter: Payer: BC Managed Care – PPO | Admitting: Family Medicine

## 2017-10-20 ENCOUNTER — Encounter: Payer: BC Managed Care – PPO | Admitting: Family Medicine

## 2017-11-02 ENCOUNTER — Other Ambulatory Visit (INDEPENDENT_AMBULATORY_CARE_PROVIDER_SITE_OTHER): Payer: Self-pay | Admitting: Family Medicine

## 2017-11-02 DIAGNOSIS — E559 Vitamin D deficiency, unspecified: Secondary | ICD-10-CM

## 2017-11-09 ENCOUNTER — Other Ambulatory Visit (INDEPENDENT_AMBULATORY_CARE_PROVIDER_SITE_OTHER): Payer: Self-pay | Admitting: Family Medicine

## 2017-11-09 DIAGNOSIS — E559 Vitamin D deficiency, unspecified: Secondary | ICD-10-CM

## 2017-11-15 NOTE — Progress Notes (Signed)
Chief Complaint  Patient presents with  . Annual Exam    fasting annual exam with pap.UA showed 2+ blood and trace leuks-patient not having any symptoms. Had recent eye exam at [facility]. Having sharp pains in her neck area, has been wokring on computer more-thinking about getting a massage and thinks she wil be fine.     Anita Taylor is a 52 y.o. female who presents for a complete physical.  She has the following concerns:  Can't remember things quite as well (retrieving information for work). No changes since she had labs done in September.  She does have some interrupted sleep related to menopausal symptoms. +snores, husband hasn't mentioned apnea.  Feels pretty refreshed when she wakes up, if she goes to bed early enough.  Denies daytime somnolence.  Obesity:  [facility], seeing Dr. Dalbert Garnet. Hasn't been since the Fall (01/2017). Stopped going due to the cost.  Getting extra protein caused constipation. Rx miralax is used every other day, and bowels are regular, but isn't eating the same way now as she was then.  Migraines: She was referred to neurologist, and started on ER propranolol. It gave her dull headaches, so hasn't been taken anything. Excedrin is helpful if taken early in the day, otherwise causes insomnia.  Sumatriptan is effective.  Migraines aren't as frequent as in the past. Lasts 1-2 days, once a month (reduced from 3x/month). She thinks improvement may be related to not having had menstrual cycles x 6 months.  She last saw Dr. Lucia Gaskins 11/2016, has no follow-up scheduled.  Her recommendations included: - MRI brain: Patient with worsening headaches, worse supine and positional. vision changes. MRI of the brain to evaluate for space-occupying lesion or intracranial hypertension or other intra-intracranial issues. (Patient did not have this completed, as she felt it was due to her cycle) - Sleep evaluation: Headaches always wake her  up 2:58 in the morning, worse in the morning. (Patient did not have this completed) - Recommend the [facility]: Weight loss (she did go there through 01/2017; it got too expensive) - Take imitrex wih a 12 hour aleve: To extend the benefits. If you continue to have to take multiple Imitrex, we can change your triptan to a longer acting formula and see if that works (never had any changes in meds; sumatriptan remains effective) - Integrative PT and massage: For musculoskeletal neck pain, cervicalgia and migraines (went for a couple of massages only)  Vitamin D deficiency: This was last treated by Dr. Dalbert Garnet.  Last check was 22.8 in 12/2016, treated with 12 weeks of prescription therapy. She is not currently taking a supplement.  She had D&C 2 years ago, related to heavy/frequent menses. Menses went away for a while, but did recur.  She now hasn't had a period in 6 months.  She is having some hot flashes and night sweats, worse with caffeine or certain foods.  H/o borderline hyperlipidemia: Improved with dietary changes. Follows lowfat, low cholesterol diet. Doesn't eat much red meat, cheese, eggs. Diet improved after last labs, in seeing weight loss clinic, not quite as good now, but probably better than prior to going there. Lab Results  Component Value Date   CHOL 171 12/29/2016   HDL 46 12/29/2016   LDLCALC 108 (H) 12/29/2016   TRIG 84 12/29/2016   CHOLHDL 3.5 09/23/2016     Immunization History  Administered Date(s) Administered  . Influenza Split 12/29/2011  .  Influenza,inj,Quad PF,6+ Mos 01/28/2016  . Influenza-Unspecified 02/02/2017  . PPD Test 01/19/2012  . Tdap 01/19/2012   Last Pap smear: 07/25/2013--normal, no high risk HPV present Last mammogram:  09/2017 Last colonoscopy: never Last DEXA: never  Dentist: twice yearly Ophtho: yearly Exercise:walks 40-45 minutes 3 times/week, on her treadmill. No weight-bearing exercise.  Past Medical History:   Diagnosis Date  . Constipation   . Migraine, menstrual   . Vitamin D deficiency     Past Surgical History:  Procedure Laterality Date  . CESAREAN SECTION     x1  . ENDOMETRIAL ABLATION  08  . KELOID EXCISION     ears  . TUBAL LIGATION      Social History   Socioeconomic History  . Marital status: Married    Spouse name: Not on file  . Number of children: 1  . Years of education: Not on file  . Highest education level: Not on file  Occupational History  . Occupation: pre-K Psychologist, occupational for [employer]    Employer: [employer]  Social Needs  . Financial resource strain: Not on file  . Food insecurity:    Worry: Not on file    Inability: Not on file  . Transportation needs:    Medical: Not on file    Non-medical: Not on file  Tobacco Use  . Smoking status: Never Smoker  . Smokeless tobacco: Never Used  Substance and Sexual Activity  . Alcohol use: Yes    Alcohol/week: 0.6 oz    Types: 1 Glasses of wine per week    Comment: a glass of wine every 3-6 months.  . Drug use: No  . Sexual activity: Yes    Partners: Male    Birth control/protection: Surgical  Lifestyle  . Physical activity:    Days per week: Not on file    Minutes per session: Not on file  . Stress: Not on file  Relationships  . Social connections:    Talks on phone: Not on file    Gets together: Not on file    Attends religious service: Not on file    Active member of club or organization: Not on file    Attends meetings of clubs or organizations: Not on file    Relationship status: Not on file  . Intimate partner violence:    Fear of current or ex partner: Not on file    Emotionally abused: Not on file    Physically abused: Not on file    Forced sexual activity: Not on file  Other Topics Concern  . Not on file  Social History Narrative   Lives with husband, daughter [facility], rising senior). No smoke exposure   Caffeine: tea and 1-2 diet sodas per week   (updated 10/2017)    Family  History  Problem Relation Age of Onset  . Hypertension Father   . Hyperlipidemia Father   . Obesity Father   . Migraines Sister   . Hypertension Mother   . Obesity Mother   . Diabetes Maternal Grandmother   . Cancer Maternal Grandfather        prostate cancer (65)    Outpatient Encounter Medications as of 11/17/2017  Medication Sig Note  . Cholecalciferol (VITAMIN D-3) 1000 units CAPS Take 1 capsule by mouth daily.   . Multiple Vitamins-Minerals (MULTIVITAMIN WITH MINERALS) tablet Take 1 tablet by mouth daily. 11/17/2017: Hasn't taken any in 3 months  . SUMAtriptan (IMITREX) 100 MG tablet TAKE 1 TABLET BY MOUTH 1  TIME. MAY REPEAT IN 2 HOURS IF HEADACHE PERSISTS OR RECURS. MAX 2 TABLETS PER 24 HOURS (Patient not taking: Reported on 03/09/2017)   . [DISCONTINUED] hydrocortisone (ANUSOL-HC) 2.5 % rectal cream Place 1 application 4 (four) times daily as needed rectally for hemorrhoids or anal itching.   . [DISCONTINUED] Vitamin D, Ergocalciferol, (DRISDOL) 50000 units CAPS capsule Take 1 capsule (50,000 Units total) by mouth every 7 (seven) days.    No facility-administered encounter medications on file as of 11/17/2017.   also using rx miralax powder every other day  No Known Allergies    ROS: The patient denies anorexia, fever, decreased hearing, ear pain, sore throat, breast concerns, chest pain, palpitations, dizziness, syncope, dyspnea on exertion, cough, swelling, nausea, vomiting, diarrhea, constipation, abdominal pain, melena, hematochezia, indigestion/heartburn, hematuria, incontinence, dysuria, vaginal bleeding, discharge, odor or itch, genital lesions, joint pains, numbness, tingling, weakness, tremor, suspicious skin lesions, depression, anxiety, abnormal bleeding/bruising, or enlarged lymph nodes. No hair/skin/bowel/mood changes. Migraine headaches --less often than in the past. Hot flashes, night sweats 20# weight gain since stopping weight loss clinic.   PHYSICAL  EXAM:  BP 122/86   Pulse 60   Ht 5\' 6"  (1.676 m)   Wt 256 lb 9.6 oz (116.4 kg)   BMI 41.42 kg/m    Wt Readings from Last 3 Encounters:  03/09/17 246 lb (111.6 kg)  02/22/17 236 lb (107 kg)  02/08/17 237 lb (107.5 kg)    General Appearance:   Alert, cooperative, no distress, appears stated age  Head:   Normocephalic, without obvious abnormality, atraumatic  Eyes:   PERRL, conjunctiva/corneas clear, EOM's intact, fundi benign  Ears:   Normal TM's and external ear canals  Nose:  Nares normal, mucosa normal, no drainage or sinus tenderness  Throat:  Lips, mucosa, and tongue normal; teeth and gums normal  Neck:  Supple, no lymphadenopathy; thyroid: noenlargement/ tenderness/nodules; no carotid bruit or JVD  Back:  Spine nontender, no curvature, ROM normal, no CVA tenderness  Lungs:   Clear to auscultation bilaterally without wheezes, rales orronchi; respirations unlabored  Chest Wall:   No tenderness or deformity  Heart:   Regular rate and rhythm, S1 and S2 normal, no murmur, rub or gallop  Breast Exam:   No tenderness, masses, or nipple discharge or inversion.No axillary lymphadenopathy  Abdomen:   Soft, non-tender, nondistended, normoactive bowel sounds,   no masses, no hepatosplenomegaly  Genitalia:   Normal external genitalia without lesions. BUS and vagina normal; no cervical motion tenderness. Nulliparous cervix without discharge or lesions. No abnormal vaginal discharge. Uterus and adnexa not enlarged, no masses, though exam somewhat limited due to body habitus; nontender. Pap performed  Rectal:   Normal tone, no masses or tenderness; guaiac negative stool  Extremities:  No clubbing, cyanosis or edema  Pulses:  2+ and symmetric all extremities  Skin:  Skin color, texture, turgor normal, no rashes or lesions  Lymph nodes:  Cervical, supraclavicular, and axillary nodes  normal  Neurologic:  CNII-XII intact, normal strength, sensation and gait; reflexes 2+ and symmetric throughout   Psych: Normal mood, affect, hygiene and grooming  Blood and trace leukocytes noted on urine dip. Denies any spotting or discharge, no urinary complaints.  ASSESSMENT/PLAN:   Annual physical exam - Plan: POCT Urinalysis DIP (Proadvantage Device), Glucose, random, Cytology - PAP([facility])  Vitamin D deficiency - completed 12 week rx in the Fall, not currently taking supplement. Recheck; discussed need for long-term daily supplement - Plan: VITAMIN D 25 Hydroxy (Vit-D Deficiency, Fractures)  Menopausal symptoms - may be contributing to some of her memory/energy complaints. Trial supplements (estroven) - Plan: Follicle stimulating hormone  Obesity, morbid, BMI 40.0-49.9 (HCC) - counseled extensively re: risks, healthy diet, portions, exercise. f/u 3 mos on wt - Plan: Glucose, random  Menstrual migraine without status migrainosus, not intractable - improved/decreased frequency, responds to imitrex, refilled; understands needs for MRI and neuro f/u if worsening in severity/frequency - Plan: SUMAtriptan (IMITREX) 100 MG tablet  Colon cancer screening - Plan: Ambulatory referral to Gastroenterology  Chronic constipation - controlled with miralax qod    Discussed monthly self breast exams and yearly mammograms; at least 30 minutes of aerobic activity at least 5 days/week, weight-bearing exercise 2-3x/wk; proper sunscreen use reviewed; healthy diet, including goals of calcium and vitamin D intake and alcohol recommendations (less than or equal to 1 drink/day) reviewed; regular seatbelt use; changing batteries in smoke detectors. Immunization recommendations discussed--flu shots recommended yearly, Shingrix recommended, risks/side effects reviewed (she will check with her insurance and schedule NV if desired). Colonoscopy recommendations reviewed due  now, referred to [facility] performed.  F/u 3 months--accountability for weight.   Consider a supplement such as Estroven (black cohosh and soy) if you are having a lot of menopausal symptoms.  It is important to cut back on carbs, increase the protein as had been recommended by Dr. Dalbert Garnet, and really work on losing some of the weight that was regained. You gained 20 pounds since you stopped going to the clinic.   Check with your husband about spells of not breathing (associated with loud snoring, and startling/snorting when breathing restarts). If he witnesses this, this could be sleep apnea and a sleep study is needed.

## 2017-11-17 ENCOUNTER — Ambulatory Visit: Payer: BC Managed Care – PPO | Admitting: Family Medicine

## 2017-11-17 ENCOUNTER — Encounter: Payer: Self-pay | Admitting: Family Medicine

## 2017-11-17 ENCOUNTER — Other Ambulatory Visit (HOSPITAL_COMMUNITY)
Admission: RE | Admit: 2017-11-17 | Discharge: 2017-11-17 | Disposition: A | Discharge status: Home / Self Care | Payer: BC Managed Care – PPO | Source: Ambulatory Visit | Attending: Family Medicine | Admitting: Family Medicine

## 2017-11-17 VITALS — BP 122/86 | HR 60 | Ht 66.0 in | Wt 256.6 lb

## 2017-11-17 DIAGNOSIS — E559 Vitamin D deficiency, unspecified: Secondary | ICD-10-CM

## 2017-11-17 DIAGNOSIS — K5909 Other constipation: Secondary | ICD-10-CM

## 2017-11-17 DIAGNOSIS — N951 Menopausal and female climacteric states: Secondary | ICD-10-CM

## 2017-11-17 DIAGNOSIS — Z0000 Encounter for general adult medical examination without abnormal findings: Secondary | ICD-10-CM

## 2017-11-17 DIAGNOSIS — Z1211 Encounter for screening for malignant neoplasm of colon: Secondary | ICD-10-CM | POA: Diagnosis not present

## 2017-11-17 DIAGNOSIS — G43829 Menstrual migraine, not intractable, without status migrainosus: Secondary | ICD-10-CM | POA: Diagnosis not present

## 2017-11-17 LAB — POCT URINALYSIS DIP (PROADVANTAGE DEVICE)
BILIRUBIN UA: NEGATIVE
BILIRUBIN UA: NEGATIVE mg/dL
Glucose, UA: NEGATIVE mg/dL
Nitrite, UA: NEGATIVE
PH UA: 6 (ref 5.0–8.0)
PROTEIN UA: NEGATIVE mg/dL
Specific Gravity, Urine: 1.03
Urobilinogen, Ur: NEGATIVE

## 2017-11-17 MED ORDER — SUMATRIPTAN SUCCINATE 100 MG PO TABS: ORAL_TABLET | ORAL | 2 refills | Status: DC

## 2017-11-17 NOTE — Patient Instructions (Addendum)
  HEALTH MAINTENANCE RECOMMENDATIONS:  It is recommended that you get at least 30 minutes of aerobic exercise at least 5 days/week (for weight loss, you may need as much as 60-90 minutes). This can be any activity that gets your heart rate up. This can be divided in 10-15 minute intervals if needed, but try and build up your endurance at least once a week.  Weight bearing exercise is also recommended twice weekly.  Eat a healthy diet with lots of vegetables, fruits and fiber.  "Colorful" foods have a lot of vitamins (ie green vegetables, tomatoes, red peppers, etc).  Limit sweet tea, regular sodas and alcoholic beverages, all of which has a lot of calories and sugar.  Up to 1 alcoholic drink daily may be beneficial for women (unless trying to lose weight, watch sugars).  Drink a lot of water.  Calcium recommendations are 1200-1500 mg daily (1500 mg for postmenopausal women or women without ovaries), and vitamin D 1000 IU daily.  This should be obtained from diet and/or supplements (vitamins), and calcium should not be taken all at once, but in divided doses.  Monthly self breast exams and yearly mammograms for women over the age of 50 is recommended.  Sunscreen of at least SPF 30 should be used on all sun-exposed parts of the skin when outside between the hours of 10 am and 4 pm (not just when at beach or pool, but even with exercise, golf, tennis, and yard work!)  Use a sunscreen that says "broad spectrum" so it covers both UVA and UVB rays, and make sure to reapply every 1-2 hours.  Remember to change the batteries in your smoke detectors when changing your clock times in the spring and fall.  Use your seat belt every time you are in a car, and please drive safely and not be distracted with cell phones and texting while driving.   Consider a supplement such as Estroven (black cohosh and soy) if you are having a lot of menopausal symptoms.  It is important to cut back on carbs, increase the  protein as had been recommended by Dr. Dalbert GarnetBeasley, and really work on losing some of the weight that was regained. You gained 20 pounds since you stopped going to the clinic.   Check with your husband about spells of not breathing (associated with loud snoring, and startling/snorting when breathing restarts). If he witnesses this, this could be sleep apnea and a sleep study is needed.  I recommend getting the new shingles vaccine (Shingrix). You will need to check with your insurance to see if it is covered, and if covered by Medicare Part D, you need to get from the pharmacy rather than our office.  It is a series of 2 injections, spaced 2 months apart.

## 2017-11-18 LAB — FOLLICLE STIMULATING HORMONE: FSH: 47.8 m[IU]/mL

## 2017-11-18 LAB — GLUCOSE, RANDOM: Glucose: 82 mg/dL (ref 65–99)

## 2017-11-18 LAB — VITAMIN D 25 HYDROXY (VIT D DEFICIENCY, FRACTURES): Vit D, 25-Hydroxy: 36 ng/mL (ref 30.0–100.0)

## 2017-11-22 LAB — CYTOLOGY - PAP
DIAGNOSIS: NEGATIVE
HPV: NOT DETECTED

## 2017-12-01 ENCOUNTER — Encounter: Payer: Self-pay | Admitting: Internal Medicine

## 2017-12-20 ENCOUNTER — Encounter: Payer: Self-pay | Admitting: Family Medicine

## 2018-02-15 ENCOUNTER — Encounter: Payer: Self-pay | Admitting: *Deleted

## 2018-02-21 NOTE — Progress Notes (Signed)
Chief Complaint  Patient presents with  . Medication Management   Patient presents for 3 month follow-up, mostly for accountability regarding weight loss attempts.  She needs a refill on her imitrex, has 1 left.  She had a cluster of migraines this week, needed medication 3 days this week.  Usually gets migraines a few days once a month. Sometimes lack of sleep is a trigger, previously also triggered by cycles. Review of chart shows refilled last 7/25 #9 with 2 refills. So is using about 9/month.  She is having a lot of tension, pain in the back of her neck.  She sometimes has discomfort when she turns her head certain directions, and sometimes this causes a slight headache. This started about 2 months ago.  She has been successful in her weight loss since she was last here--she went back to prior meal plan from healthy weight [facility] clinic (is on the waiting list to restart program at the clinic). She cut out sweets (just 100 calorie snacks). She started walking.  She belongs to [employer], walks on the treadmill, recently restarted.  Going after work, trying 3-4 days/week.  Does 2 miles, 40 minutes. Hasn't started any weights.  Vitamin D deficiency: This was last treated by Dr. Dalbert Garnet.  Level was 22.8 in 12/2016, treated with 12 weeks of prescription therapy. She had not been taking a supplement at her last visit in July, but recheck was normal at 36. She has been taking a daily MVI, not a separate vitamin D.   PMH, PSH, SH reviewed  Outpatient Encounter Medications as of 02/23/2018  Medication Sig  . Multiple Vitamins-Minerals (MULTIVITAMIN WITH MINERALS) tablet Take 1 tablet by mouth daily.  . Cholecalciferol (VITAMIN D-3) 1000 units CAPS Take 1 capsule by mouth daily.  . SUMAtriptan (IMITREX) 100 MG tablet TAKE 1 TABLET BY MOUTH 1 TIME. MAY REPEAT IN 2 HOURS IF HEADACHE PERSISTS OR RECURS. MAX 2 TABLETS PER 24 HOURS  . [DISCONTINUED] SUMAtriptan (IMITREX) 100 MG tablet TAKE 1 TABLET BY  MOUTH 1 TIME. MAY REPEAT IN 2 HOURS IF HEADACHE PERSISTS OR RECURS. MAX 2 TABLETS PER 24 HOURS (Patient not taking: Reported on 02/23/2018)   No facility-administered encounter medications on file as of 02/23/2018.    No Known Allergies  ROS:  No fever, chills, dizziness, numbness, tingling, chest pain, palpitations, URI symptoms, nausea, vomiting.  See HPI     PHYSICAL EXAM:  BP 112/80   Pulse (!) 58   Temp 97.9 F (36.6 C) (Oral)   Ht 5\' 6"  (1.676 m)   Wt 237 lb 9.6 oz (107.8 kg)   SpO2 98%   BMI 38.35 kg/m   Wt Readings from Last 3 Encounters:  02/23/18 237 lb 9.6 oz (107.8 kg)  11/17/17 256 lb 9.6 oz (116.4 kg)  03/09/17 246 lb (111.6 kg)   Well appearing, pleasant female in no distress HEENT: conjunctiva and sclera are clear, EOMI. Neck: no lymphadenopathy or mass. Tender at both trapezius muscles, no spinal tenderness. No paraspinous tenderness or spasm. Heart: regular rate and rhythm Lungs: clear bilaterally Abdomen: soft, nontender Extremities: no edema, normal pulses Neuro: alert and oriented. Cranial nerves normal, normal strength, sensation, DTR's in upper extremities Psych: normal mood, affect, hygiene and grooming  ASSESSMENT/PLAN:  Posterior neck pain - likely contributing to headaches; heat, stretches, massage, trial of NSAIDs. Posture discussed  Menstrual migraine without status migrainosus, not intractable - now perimenopausal, still having migraines, possibly triggered by muscular HA's.  - Plan: SUMAtriptan (IMITREX) 100  MG tablet  Class 2 obesity due to excess calories with body mass index (BMI) of 38.0 to 38.9 in adult, unspecified whether serious comorbidity present - congratulated on weight loss; encouraged continued proper diet, increasing exercise, adding in weight-bearing exercise  25 min, more than 1/2 spent counseling re: headaches, posture, teaching stretches, risks of overuse of triptans, counseling re: diet, exercise.    Your recent  worsening neck discomfort may be contributing to your headaches and increased frequency of migraines (that I can see based on your imitrex usage). Pay attention to your posture to keep a neutral neck. Do frequent stretches during the day. Try heat (vs ice) when you have discomfort. Take an anti-inflammatory such as ibuprofen or aleve. Doing this first, may help your neck pain, and prevent the more serious headache.  If neck pain persists, we should consider physical therapy. Let us know if you develop pain that radiates into the arm, or numbness, tingling or any weakness in the arm.   Great job on the weight loss! Continue to reach for 150 minutes of cardio each week, and work in some weight-bearing exercise (upper extremity) twice a week at least.  Continue your daily multivitamin--look at the label and ensure that it has 1000 IU of Vitamin D3 (and that serving size is just 1 tablet, since they seem small).

## 2018-02-23 ENCOUNTER — Ambulatory Visit: Payer: BC Managed Care – PPO | Admitting: Family Medicine

## 2018-02-23 ENCOUNTER — Encounter: Payer: Self-pay | Admitting: Family Medicine

## 2018-02-23 VITALS — BP 112/80 | HR 58 | Temp 97.9°F | Ht 66.0 in | Wt 237.6 lb

## 2018-02-23 DIAGNOSIS — M542 Cervicalgia: Secondary | ICD-10-CM | POA: Diagnosis not present

## 2018-02-23 DIAGNOSIS — Z6838 Body mass index (BMI) 38.0-38.9, adult: Secondary | ICD-10-CM

## 2018-02-23 DIAGNOSIS — G43829 Menstrual migraine, not intractable, without status migrainosus: Secondary | ICD-10-CM

## 2018-02-23 DIAGNOSIS — E6609 Other obesity due to excess calories: Secondary | ICD-10-CM

## 2018-02-23 MED ORDER — SUMATRIPTAN SUCCINATE 100 MG PO TABS: ORAL_TABLET | ORAL | 0 refills | Status: DC

## 2018-02-23 NOTE — Patient Instructions (Addendum)
Your recent worsening neck discomfort may be contributing to your headaches and increased frequency of migraines (that I can see based on your imitrex usage). Pay attention to your posture to keep a neutral neck. Do frequent stretches during the day. Try heat (vs ice) when you have discomfort. Take an anti-inflammatory such as ibuprofen or aleve. Doing this first, may help your neck pain, and prevent the more serious headache.  If neck pain persists, we should consider physical therapy. Let us know if you develop pain that radiates into the arm, or numbness, tingling or any weakness in the arm.  Do the stretches as shown twice daily, 5-10 repetitions in each direction.   Great job on the weight loss! Continue to reach for 150 minutes of cardio each week, and work in some weight-bearing exercise (upper extremity) twice a week at least.  Continue your daily multivitamin--look at the label and ensure that it has 1000 IU of Vitamin D3 (and that serving size is just 1 tablet, since they seem small).    Neck Exercises Neck exercises can be important for many reasons:  They can help you to improve and maintain flexibility in your neck. This can be especially important as you age.  They can help to make your neck stronger. This can make movement easier.  They can reduce or prevent neck pain.  They may help your upper back.  Ask your health care provider which neck exercises would be best for you. Exercises Neck Press Repeat this exercise 10 times. Do it first thing in the morning and right before bed or as told by your health care provider. 1. Lie on your back on a firm bed or on the floor with a pillow under your head. 2. Use your neck muscles to push your head down on the pillow and straighten your spine. 3. Hold the position as well as you can. Keep your head facing up and your chin tucked. 4. Slowly count to 5 while holding this position. 5. Relax for a few seconds. Then  repeat.  Isometric Strengthening Do a full set of these exercises 2 times a day or as told by your health care provider. 1. Sit in a supportive chair and place your hand on your forehead. 2. Push forward with your head and neck while pushing back with your hand. Hold for 10 seconds. 3. Relax. Then repeat the exercise 3 times. 4. Next, do thesequence again, this time putting your hand against the back of your head. Use your head and neck to push backward against the hand pressure. 5. Finally, do the same exercise on either side of your head, pushing sideways against the pressure of your hand.  Prone Head Lifts Repeat this exercise 5 times. Do this 2 times a day or as told by your health care provider. 1. Lie face-down, resting on your elbows so that your chest and upper back are raised. 2. Start with your head facing downward, near your chest. Position your chin either on or near your chest. 3. Slowly lift your head upward. Lift until you are looking straight ahead. Then continue lifting your head as far back as you can stretch. 4. Hold your head up for 5 seconds. Then slowly lower it to your starting position.  Supine Head Lifts Repeat this exercise 8-10 times. Do this 2 times a day or as told by your health care provider. 1. Lie on your back, bending your knees to point to the ceiling and keeping  your feet flat on the floor. 2. Lift your head slowly off the floor, raising your chin toward your chest. 3. Hold for 5 seconds. 4. Relax and repeat.  Scapular Retraction Repeat this exercise 5 times. Do this 2 times a day or as told by your health care provider. 1. Stand with your arms at your sides. Look straight ahead. 2. Slowly pull both shoulders backward and downward until you feel a stretch between your shoulder blades in your upper back. 3. Hold for 10-30 seconds. 4. Relax and repeat.  Contact a health care provider if:  Your neck pain or discomfort gets much worse when you do an  exercise.  Your neck pain or discomfort does not improve within 2 hours after you exercise. If you have any of these problems, stop exercising right away. Do not do the exercises again unless your health care provider says that you can. Get help right away if:  You develop sudden, severe neck pain. If this happens, stop exercising right away. Do not do the exercises again unless your health care provider says that you can. Exercises Neck Stretch  Repeat this exercise 3-5 times. 1. Do this exercise while standing or while sitting in a chair. 2. Place your feet flat on the floor, shoulder-width apart. 3. Slowly turn your head to the right. Turn it all the way to the right so you can look over your right shoulder. Do not tilt or tip your head. 4. Hold this position for 10-30 seconds. 5. Slowly turn your head to the left, to look over your left shoulder. 6. Hold this position for 10-30 seconds.  Neck Retraction Repeat this exercise 8-10 times. Do this 3-4 times a day or as told by your health care provider. 1. Do this exercise while standing or while sitting in a sturdy chair. 2. Look straight ahead. Do not bend your neck. 3. Use your fingers to push your chin backward. Do not bend your neck for this movement. Continue to face straight ahead. If you are doing the exercise properly, you will feel a slight sensation in your throat and a stretch at the back of your neck. 4. Hold the stretch for 1-2 seconds. Relax and repeat.  This information is not intended to replace advice given to you by your health care provider. Make sure you discuss any questions you have with your health care provider. Document Released: 03/24/2015 Document Revised: 09/18/2015 Document Reviewed: 10/21/2014 Elsevier Interactive Patient Education  [employer].

## 2018-04-12 ENCOUNTER — Encounter (INDEPENDENT_AMBULATORY_CARE_PROVIDER_SITE_OTHER): Payer: BC Managed Care – PPO

## 2018-05-09 ENCOUNTER — Encounter (INDEPENDENT_AMBULATORY_CARE_PROVIDER_SITE_OTHER): Payer: Self-pay | Admitting: Family Medicine

## 2018-05-09 ENCOUNTER — Ambulatory Visit (INDEPENDENT_AMBULATORY_CARE_PROVIDER_SITE_OTHER): Payer: BC Managed Care – PPO | Admitting: Family Medicine

## 2018-05-09 ENCOUNTER — Telehealth: Payer: Self-pay | Admitting: Family Medicine

## 2018-05-09 VITALS — BP 112/76 | HR 56 | Temp 97.4°F | Ht 66.0 in | Wt 229.0 lb

## 2018-05-09 DIAGNOSIS — R0602 Shortness of breath: Secondary | ICD-10-CM

## 2018-05-09 DIAGNOSIS — Z0289 Encounter for other administrative examinations: Secondary | ICD-10-CM

## 2018-05-09 DIAGNOSIS — G4700 Insomnia, unspecified: Secondary | ICD-10-CM

## 2018-05-09 DIAGNOSIS — Z9189 Other specified personal risk factors, not elsewhere classified: Secondary | ICD-10-CM

## 2018-05-09 DIAGNOSIS — E8881 Metabolic syndrome: Secondary | ICD-10-CM

## 2018-05-09 DIAGNOSIS — Z1331 Encounter for screening for depression: Secondary | ICD-10-CM | POA: Diagnosis not present

## 2018-05-09 DIAGNOSIS — Z6837 Body mass index (BMI) 37.0-37.9, adult: Secondary | ICD-10-CM

## 2018-05-09 DIAGNOSIS — E7849 Other hyperlipidemia: Secondary | ICD-10-CM | POA: Diagnosis not present

## 2018-05-09 DIAGNOSIS — R5383 Other fatigue: Secondary | ICD-10-CM | POA: Diagnosis not present

## 2018-05-09 DIAGNOSIS — E559 Vitamin D deficiency, unspecified: Secondary | ICD-10-CM

## 2018-05-09 MED ORDER — ZOLPIDEM TARTRATE 10 MG PO TABS: 5.0000 mg | ORAL_TABLET | Freq: Every evening | ORAL | 0 refills | Status: DC | PRN

## 2018-05-09 NOTE — Telephone Encounter (Signed)
Looks like it was last prescribed in 01/2016. Refill sent in

## 2018-05-09 NOTE — Telephone Encounter (Signed)
Pt called and is requesting a refill on her Ambien states she is having a hard time sleeping states she was on it before, states the states the hard time sleeping comes and goes, pt uses [id number] - ,  - [address] and pt can be reached at [phone] and informed pt that you was out of the office today

## 2018-05-10 LAB — COMPREHENSIVE METABOLIC PANEL
ALT: 10 IU/L (ref 0–32)
AST: 17 IU/L (ref 0–40)
Albumin/Globulin Ratio: 1.4 (ref 1.2–2.2)
Albumin: 4.3 g/dL (ref 3.5–5.5)
Alkaline Phosphatase: 96 IU/L (ref 39–117)
BUN/Creatinine Ratio: 17 (ref 9–23)
BUN: 12 mg/dL (ref 6–24)
Bilirubin Total: 0.4 mg/dL (ref 0.0–1.2)
CO2: 22 mmol/L (ref 20–29)
CREATININE: 0.69 mg/dL (ref 0.57–1.00)
Calcium: 9.6 mg/dL (ref 8.7–10.2)
Chloride: 100 mmol/L (ref 96–106)
GFR calc Af Amer: 117 mL/min/{1.73_m2} (ref >59)
GFR, EST NON AFRICAN AMERICAN: 102 mL/min/{1.73_m2} (ref >59)
GLUCOSE: 81 mg/dL (ref 65–99)
Globulin, Total: 3.1 g/dL (ref 1.5–4.5)
Potassium: 4.5 mmol/L (ref 3.5–5.2)
Sodium: 139 mmol/L (ref 134–144)
Total Protein: 7.4 g/dL (ref 6.0–8.5)

## 2018-05-10 LAB — CBC WITH DIFFERENTIAL
BASOS: 1 %
Basophils Absolute: 0 10*3/uL (ref 0.0–0.2)
EOS (ABSOLUTE): 0.1 10*3/uL (ref 0.0–0.4)
EOS: 2 %
Hematocrit: 37.4 % (ref 34.0–46.6)
Hemoglobin: 12.5 g/dL (ref 11.1–15.9)
IMMATURE GRANULOCYTES: 0 %
Immature Grans (Abs): 0 10*3/uL (ref 0.0–0.1)
LYMPHS ABS: 1.6 10*3/uL (ref 0.7–3.1)
Lymphs: 27 %
MCH: 30.4 pg (ref 26.6–33.0)
MCHC: 33.4 g/dL (ref 31.5–35.7)
MCV: 91 fL (ref 79–97)
MONOS ABS: 0.5 10*3/uL (ref 0.1–0.9)
Monocytes: 8 %
NEUTROS PCT: 62 %
Neutrophils Absolute: 3.8 10*3/uL (ref 1.4–7.0)
RBC: 4.11 x10E6/uL (ref 3.77–5.28)
RDW: 13 % (ref 11.7–15.4)
WBC: 6 10*3/uL (ref 3.4–10.8)

## 2018-05-10 LAB — VITAMIN B12: VITAMIN B 12: 480 pg/mL (ref 232–1245)

## 2018-05-10 LAB — T3: T3, Total: 94 ng/dL (ref 71–180)

## 2018-05-10 LAB — HEMOGLOBIN A1C
Est. average glucose Bld gHb Est-mCnc: 111 mg/dL
Hgb A1c MFr Bld: 5.5 % (ref 4.8–5.6)

## 2018-05-10 LAB — LIPID PANEL WITH LDL/HDL RATIO
Cholesterol, Total: 206 mg/dL — ABNORMAL HIGH (ref 100–199)
HDL: 60 mg/dL (ref >39)
LDL Calculated: 134 mg/dL — ABNORMAL HIGH (ref 0–99)
LDl/HDL Ratio: 2.2 ratio (ref 0.0–3.2)
TRIGLYCERIDES: 62 mg/dL (ref 0–149)
VLDL Cholesterol Cal: 12 mg/dL (ref 5–40)

## 2018-05-10 LAB — TSH: TSH: 1.59 u[IU]/mL (ref 0.450–4.500)

## 2018-05-10 LAB — T4, FREE: Free T4: 1.39 ng/dL (ref 0.82–1.77)

## 2018-05-10 LAB — FOLATE: Folate: 14.4 ng/mL (ref >3.0)

## 2018-05-10 LAB — VITAMIN D 25 HYDROXY (VIT D DEFICIENCY, FRACTURES): Vit D, 25-Hydroxy: 22.8 ng/mL — ABNORMAL LOW (ref 30.0–100.0)

## 2018-05-10 LAB — INSULIN, RANDOM: INSULIN: 5.7 u[IU]/mL (ref 2.6–24.9)

## 2018-05-11 NOTE — Progress Notes (Signed)
.  Office: [phone]  /  Fax: [phone]   HPI:   Chief Complaint: OBESITY  Anita Taylor (MR# [id number]) is a 53 y.o. female who presents on 05/09/2018 for obesity evaluation and treatment. Current BMI is Body mass index is 36.96 kg/m.Marland Kitchen Anita Taylor has struggled with obesity for years and has been unsuccessful in either losing weight or maintaining long term weight loss. Anita Taylor attended our information session and states she is currently in the action stage of change and ready to dedicate time achieving and maintaining a healthier weight.   Anita Taylor is a previous patient of ours, (previously on Category 2 plan + 100 calories).   Anita Taylor states her family eats meals together she thinks her family will eat healthier with  her her desired weight loss is 39 lbs she has been heavy most of  her life she started gaining weight after age 65 her heaviest weight ever was 260 lbs she is a picky eater and doesn't like to eat healthier foods  she has significant food cravings issues  she skips meals frequently she is frequently drinking liquids with calories she frequently makes poor food choices she has problems with excessive hunger  she frequently eats larger portions than normal  she struggles with emotional eating    Fatigue Anita Taylor feels her energy is lower than it should be. This has worsened with weight gain and has not worsened recently. Anita Taylor admits to daytime somnolence and  admits to waking up still tired. Patient is at risk for obstructive sleep apnea. Patent has a history of symptoms of daytime fatigue. Patient generally gets 3 hours of sleep per night, and states they generally have difficulty falling asleep. Snoring is present. Apneic episodes are not present. Epworth Sleepiness Score is 4.  Dyspnea on exertion Anita Taylor notes increasing shortness of breath with exercising and seems to be worsening over time with weight gain. She notes getting out of breath sooner with activity than she used to.  This has not gotten worse recently. EKG-Sinus bradycardia, poor R wave progression. Anita Taylor denies orthopnea.  Hyperlipidemia Anita Taylor has hyperlipidemia and has been trying to improve her cholesterol levels with intensive lifestyle modification including a low saturated fat diet, exercise and weight loss. Her last LDL was 108 and HDL was 46. She denies any chest pain, claudication or myalgias.  At risk for cardiovascular disease Anita Taylor is at a higher than average risk for cardiovascular disease due to obesity and hyperlipidemia. She currently denies any chest pain.  Vitamin D Deficiency Anita Taylor has a diagnosis of vitamin D deficiency. She is currently taking OTC Vit D 1,000 IU daily. She denies nausea, vomiting or muscle weakness.  Insulin Resistance Anita Taylor has a diagnosis of insulin resistance based on her elevated fasting insulin level >5. Although Anita Taylor's blood glucose readings are still under good control, insulin resistance puts her at greater risk of metabolic syndrome and diabetes. She is not on any medications and she notes carbohydrate cravings. She continues to work on diet and exercise to decrease risk of diabetes.  Depression Screen Anita Taylor's Food and Mood (modified PHQ-9) score was  Depression screen PHQ 2/9 05/09/2018  Decreased Interest 1  Down, Depressed, Hopeless 1  PHQ - 2 Score 2  Altered sleeping 0  Tired, decreased energy 1  Change in appetite 2  Feeling bad or failure about yourself  0  Trouble concentrating 2  Moving slowly or fidgety/restless 0  Suicidal thoughts 0  PHQ-9 Score 7  Difficult doing work/chores Not difficult at all  ASSESSMENT AND PLAN:  Other fatigue - Plan: EKG 12-Lead, Vitamin B12, CBC With Differential, Folate, T3, T4, free, TSH  Shortness of breath on exertion  Other hyperlipidemia - Plan: Lipid Panel With LDL/HDL Ratio  Vitamin D deficiency - Plan: VITAMIN D 25 Hydroxy (Vit-D Deficiency, Fractures)  Insulin resistance - Plan: Comprehensive  metabolic panel, Hemoglobin A1c, Insulin, random  At risk for heart disease  Depression screening  Class 2 severe obesity with serious comorbidity and body mass index (BMI) of 37.0 to 37.9 in adult, unspecified obesity type (HCC)  PLAN:  Fatigue Anita Taylor was informed that her fatigue may be related to obesity, depression or many other causes. Labs will be ordered, and in the meanwhile Anita Taylor has agreed to work on diet, exercise and weight loss to help with fatigue. Proper sleep hygiene was discussed including the need for 7-8 hours of quality sleep each night. A sleep study was not ordered based on symptoms and Epworth score.  Dyspnea on exertion Anita Taylor's shortness of breath appears to be obesity related and exercise induced. She has agreed to work on weight loss and gradually increase exercise to treat her exercise induced shortness of breath. If Anita Taylor follows our instructions and loses weight without improvement of her shortness of breath, we will plan to refer to pulmonology. We will monitor this condition regularly. Anita Taylor agrees to this plan.  Hyperlipidemia Anita Taylor was informed of the American Heart Association Guidelines emphasizing intensive lifestyle modifications as the first line treatment for hyperlipidemia. We discussed many lifestyle modifications today in depth, and Anita Taylor will continue to work on decreasing saturated fats such as fatty red meat, butter and many fried foods. She will also increase vegetables and lean protein in her diet and continue to work on exercise and weight loss efforts. We will check FLP today. Anita Taylor agrees to follow up with our clinic in 2 weeks.  Cardiovascular risk counselling Anita Taylor was given extended (15 minutes) coronary artery disease prevention counseling today. She is 53 y.o. female and has risk factors for heart disease including obesity and hyperlipidemia. We discussed intensive lifestyle modifications today with an emphasis on specific weight loss instructions and  strategies. Pt was also informed of the importance of increasing exercise and decreasing saturated fats to help prevent heart disease.  Vitamin D Deficiency Anita Taylor was informed that low vitamin D levels contributes to fatigue and are associated with obesity, breast, and colon cancer. She will follow up for routine testing of vitamin D, at least 2-3 times per year. She was informed of the risk of over-replacement of vitamin D and agrees to not increase her dose unless she discusses this with [place] first. We will check Vit D level today. Anita Taylor agrees to follow up with our clinic in 2 weeks.  Insulin Resistance Anita Taylor will continue to work on weight loss, exercise, and decreasing simple carbohydrates in her diet to help decrease the risk of diabetes. We dicussed metformin including benefits and risks. She was informed that eating too many simple carbohydrates or too many calories at one sitting increases the likelihood of GI side effects. Michel declined metformin for now and prescription was not written today. We will check Hgb A1c and insulin today. Anita Taylor agrees to follow up with our clinic in 2 weeks as directed to monitor her progress.  Depression Screen Anita Taylor had a mildly positive depression screening. Depression is commonly associated with obesity and often results in emotional eating behaviors. We will monitor this closely and work on CBT to help improve the  non-hunger eating patterns. Referral to Psychology may be required if no improvement is seen as she continues in our clinic.  Obesity Anita Taylor is currently in the action stage of change and her goal is to continue with weight loss efforts She has agreed to follow the Category 2 plan Anita Taylor has been instructed to work up to a goal of 150 minutes of combined cardio and strengthening exercise per week for weight loss and overall health benefits. We discussed the following Behavioral Modification Strategies today: increasing lean protein intake, increasing  vegetables, work on meal planning and easy cooking plans, and planning for success  Anita Taylor has agreed to follow up with our clinic in 2 weeks. She was informed of the importance of frequent follow up visits to maximize her success with intensive lifestyle modifications for her multiple health conditions. She was informed we would discuss her lab results at her next visit unless there is a critical issue that needs to be addressed sooner. Anita Taylor agreed to keep her next visit at the agreed upon time to discuss these results.  ALLERGIES: No Known Allergies  MEDICATIONS: Current Outpatient Medications on File Prior to Visit  Medication Sig Dispense Refill  . Cholecalciferol (VITAMIN D-3) 1000 units CAPS Take 1 capsule by mouth daily.    . Multiple Vitamins-Minerals (MULTIVITAMIN WITH MINERALS) tablet Take 1 tablet by mouth daily.    . SUMAtriptan (IMITREX) 100 MG tablet TAKE 1 TABLET BY MOUTH 1 TIME. MAY REPEAT IN 2 HOURS IF HEADACHE PERSISTS OR RECURS. MAX 2 TABLETS PER 24 HOURS 9 tablet 0   No current facility-administered medications on file prior to visit.     PAST MEDICAL HISTORY: Past Medical History:  Diagnosis Date  . Constipation   . Migraine, menstrual   . Vitamin D deficiency     PAST SURGICAL HISTORY: Past Surgical History:  Procedure Laterality Date  . CESAREAN SECTION     x1  . ENDOMETRIAL ABLATION  08  . KELOID EXCISION     ears  . TUBAL LIGATION      SOCIAL HISTORY: Social History   Tobacco Use  . Smoking status: Never Smoker  . Smokeless tobacco: Never Used  Substance Use Topics  . Alcohol use: Yes    Alcohol/week: 1.0 standard drinks    Types: 1 Glasses of wine per week    Comment: a glass of wine every 3-6 months.  . Drug use: No    FAMILY HISTORY: Family History  Problem Relation Age of Onset  . Hypertension Father   . Hyperlipidemia Father   . Obesity Father   . Migraines Sister   . Hypertension Mother   . Obesity Mother   . Diabetes Maternal  Grandmother   . Cancer Maternal Grandfather        prostate cancer (65)    ROS: Review of Systems  Constitutional: Positive for malaise/fatigue. Negative for weight loss.  Respiratory: Positive for shortness of breath (with exertion).   Cardiovascular: Negative for chest pain, orthopnea and claudication.  Gastrointestinal: Positive for constipation. Negative for nausea and vomiting.  Musculoskeletal: Negative for myalgias.       Negative muscle weakness  Skin:       + Dryness + Hair changes  Neurological: Positive for headaches.    PHYSICAL EXAM: Blood pressure 112/76, pulse (!) 56, temperature (!) 97.4 F (36.3 C), temperature source Oral, height 5\' 6"  (1.676 m), weight 229 lb (103.9 kg), SpO2 99 %. Body mass index is 36.96 kg/m. Physical Exam Vitals signs  reviewed.  Constitutional:      Appearance: Normal appearance. She is obese.  HENT:     Head: Normocephalic and atraumatic.     Nose: Nose normal.  Eyes:     General: No scleral icterus.    Extraocular Movements: Extraocular movements intact.  Neck:     Musculoskeletal: Normal range of motion and neck supple.     Comments: No thyromegaly present Cardiovascular:     Rate and Rhythm: Regular rhythm. Bradycardia present.     Pulses: Normal pulses.     Heart sounds: Normal heart sounds.  Pulmonary:     Effort: Pulmonary effort is normal. No respiratory distress.     Breath sounds: Normal breath sounds.  Abdominal:     Palpations: Abdomen is soft.     Tenderness: There is no abdominal tenderness.     Comments: + Obesity  Musculoskeletal: Normal range of motion.     Right lower leg: No edema.     Left lower leg: No edema.  Skin:    General: Skin is warm and dry.  Neurological:     Mental Status: She is alert and oriented to person, place, and time.     Coordination: Coordination normal.  Psychiatric:        Mood and Affect: Mood normal.        Behavior: Behavior normal.     RECENT LABS AND TESTS: BMET      Component Value Date/Time   NA 139 05/09/2018 1238   K 4.5 05/09/2018 1238   CL 100 05/09/2018 1238   CO2 22 05/09/2018 1238   GLUCOSE 81 05/09/2018 1238   GLUCOSE 82 09/18/2015 0903   BUN 12 05/09/2018 1238   CREATININE 0.69 05/09/2018 1238   CREATININE 0.70 09/18/2015 0903   CALCIUM 9.6 05/09/2018 1238   GFRNONAA 102 05/09/2018 1238   GFRAA 117 05/09/2018 1238   Lab Results  Component Value Date   HGBA1C 5.5 05/09/2018   Lab Results  Component Value Date   INSULIN 5.7 05/09/2018   CBC    Component Value Date/Time   WBC 6.0 05/09/2018 1238   WBC 6.3 09/18/2015 0903   RBC 4.11 05/09/2018 1238   RBC 4.05 09/18/2015 0903   HGB 12.5 05/09/2018 1238   HCT 37.4 05/09/2018 1238   PLT 259 09/18/2015 0903   MCV 91 05/09/2018 1238   MCH 30.4 05/09/2018 1238   MCH 30.9 09/18/2015 0903   MCHC 33.4 05/09/2018 1238   MCHC 33.2 09/18/2015 0903   RDW 13.0 05/09/2018 1238   LYMPHSABS 1.6 05/09/2018 1238   MONOABS 441 09/18/2015 0903   EOSABS 0.1 05/09/2018 1238   BASOSABS 0.0 05/09/2018 1238   Iron/TIBC/Ferritin/ %Sat No results found for: IRON, TIBC, FERRITIN, IRONPCTSAT Lipid Panel     Component Value Date/Time   CHOL 206 (H) 05/09/2018 1238   TRIG 62 05/09/2018 1238   HDL 60 05/09/2018 1238   CHOLHDL 3.5 09/23/2016 0927   VLDL 15 09/23/2016 0927   LDLCALC 134 (H) 05/09/2018 1238   Hepatic Function Panel     Component Value Date/Time   PROT 7.4 05/09/2018 1238   ALBUMIN 4.3 05/09/2018 1238   AST 17 05/09/2018 1238   ALT 10 05/09/2018 1238   ALKPHOS 96 05/09/2018 1238   BILITOT 0.4 05/09/2018 1238      Component Value Date/Time   TSH 1.590 05/09/2018 1238    ECG  shows NSR with a rate of 53 BPM INDIRECT CALORIMETER done today shows a VO2  of 176 and a REE of 1221. Her calculated basal metabolic rate is 1610 thus her basal metabolic rate is worse than expected.       OBESITY BEHAVIORAL INTERVENTION VISIT  Today's visit was # 1   Starting weight: 229  lbs Starting date: 05/09/2018 Today's weight : 229 lbs  Today's date: 05/09/2018 Total lbs lost to date: 0    ASK: We discussed the diagnosis of obesity with Anita Floor Johndrow today and Leighla agreed to give [place] permission to discuss obesity behavioral modification therapy today.  ASSESS: Sopheap has the diagnosis of obesity and her BMI today is 36.98 Lilibeth is in the action stage of change   ADVISE: Ofilia was educated on the multiple health risks of obesity as well as the benefit of weight loss to improve her health. She was advised of the need for long term treatment and the importance of lifestyle modifications to improve her current health and to decrease her risk of future health problems.  AGREE: Multiple dietary modification options and treatment options were discussed and  Marieme agreed to follow the recommendations documented in the above note.  ARRANGE: Lesleigh was educated on the importance of frequent visits to treat obesity as outlined per CMS and USPSTF guidelines and agreed to schedule her next follow up appointment today.   I, Burt Knack, am acting as transcriptionist for Debbra Riding, MD  I have reviewed the above documentation for accuracy and completeness, and I agree with the above. - Debbra Riding, MD

## 2018-05-23 ENCOUNTER — Ambulatory Visit (INDEPENDENT_AMBULATORY_CARE_PROVIDER_SITE_OTHER): Payer: BC Managed Care – PPO | Admitting: Family Medicine

## 2018-05-23 ENCOUNTER — Encounter (INDEPENDENT_AMBULATORY_CARE_PROVIDER_SITE_OTHER): Payer: Self-pay | Admitting: Family Medicine

## 2018-05-23 VITALS — BP 120/74 | HR 57 | Temp 97.6°F | Ht 66.0 in | Wt 223.0 lb

## 2018-05-23 DIAGNOSIS — E559 Vitamin D deficiency, unspecified: Secondary | ICD-10-CM | POA: Diagnosis not present

## 2018-05-23 DIAGNOSIS — Z9189 Other specified personal risk factors, not elsewhere classified: Secondary | ICD-10-CM

## 2018-05-23 DIAGNOSIS — Z6836 Body mass index (BMI) 36.0-36.9, adult: Secondary | ICD-10-CM

## 2018-05-23 DIAGNOSIS — E7849 Other hyperlipidemia: Secondary | ICD-10-CM | POA: Diagnosis not present

## 2018-05-23 MED ORDER — VITAMIN D (ERGOCALCIFEROL) 1.25 MG (50000 UNIT) PO CAPS: 50000.0000 [IU] | ORAL_CAPSULE | ORAL | 0 refills | Status: DC

## 2018-05-24 NOTE — Progress Notes (Signed)
Office: [phone]  /  Fax: [phone]   HPI:   Chief Complaint: OBESITY Anita Taylor is here to discuss her progress with her obesity treatment plan. She is on the Category 2 plan and is following her eating plan approximately 90 % of the time. She states she is walking for 30 minutes 3 times per week. Anita Taylor voiced she struggled with portion of meat secondary to constipations. She is getting in at least 6 oz of meat at dinner. She notes occasional hunger around 3-4 in the afternoon.  Her weight is 223 lb (101.2 kg) today and has had a weight loss of 6 pounds over a period of 2 weeks since her last visit. She has lost 30 lbs since starting treatment with us.  Vitamin D Deficiency Anita Taylor has a diagnosis of vitamin D deficiency. She is currently taking OTC Vit D 2,000 IU daily. She notes fatigue and denies nausea, vomiting or muscle weakness.  At risk for osteopenia and osteoporosis Anita Taylor is at higher risk of osteopenia and osteoporosis due to vitamin D deficiency.   Hyperlipidemia Anita Taylor has hyperlipidemia and has been trying to improve her cholesterol levels with intensive lifestyle modification including a low saturated fat diet, exercise and weight loss. Her LDL is at 134 and she is not on statin. She denies any chest pain, claudication or myalgias.  ASSESSMENT AND PLAN:  Vitamin D deficiency - Plan: Vitamin D, Ergocalciferol, (DRISDOL) 1.25 MG (50000 UT) CAPS capsule  Other hyperlipidemia  At risk for osteoporosis  Class 2 severe obesity with serious comorbidity and body mass index (BMI) of 36.0 to 36.9 in adult, unspecified obesity type (HCC)  PLAN:  Vitamin D Deficiency Anita Taylor was informed that low vitamin D levels contributes to fatigue and are associated with obesity, breast, and colon cancer. Anita Taylor agrees to start prescription Vit D @50 ,000 IU every week #4 with no refills. She will follow up for routine testing of vitamin D, at least 2-3 times per year. She was informed of the risk of  over-replacement of vitamin D and agrees to not increase her dose unless she discusses this with us first. Anita Taylor agrees to follow up with our clinic in 2 weeks.  At risk for osteopenia and osteoporosis Anita Taylor was given extended (30 minutes) osteoporosis prevention counseling today. Anita Taylor is at risk for osteopenia and osteoporsis due to her vitamin D deficiency. She was encouraged to take her vitamin D and follow her higher calcium diet and increase strengthening exercise to help strengthen her bones and decrease her risk of osteopenia and osteoporosis.  Hyperlipidemia Anita Taylor was informed of the American Heart Association Guidelines emphasizing intensive lifestyle modifications as the first line treatment for hyperlipidemia. We discussed many lifestyle modifications today in depth, and Anita Taylor will continue to work on decreasing saturated fats such as fatty red meat, butter and many fried foods. She will also increase vegetables and lean protein in her diet and continue to work on exercise and weight loss efforts. We will follow up with [facility] in 3 months. Anita Taylor agrees to follow up with our clinic in 2 weeks.  Obesity Anita Taylor is currently in the action stage of change. As such, her goal is to continue with weight loss efforts She has agreed to follow the Category 2 plan Anita Taylor has been instructed to work up to a goal of 150 minutes of combined cardio and strengthening exercise per week for weight loss and overall health benefits. We discussed the following Behavioral Modification Strategies today: increasing lean protein intake, increasing  vegetables and work on meal planning and easy cooking plans, and planning for success   Anita Taylor has agreed to follow up with our clinic in 2 weeks. She was informed of the importance of frequent follow up visits to maximize her success with intensive lifestyle modifications for her multiple health conditions.  ALLERGIES: No Known Allergies  MEDICATIONS: Current Outpatient  Medications on File Prior to Visit  Medication Sig Dispense Refill  . Cholecalciferol (VITAMIN D-3) 1000 units CAPS Take 1 capsule by mouth daily.    . Multiple Vitamins-Minerals (MULTIVITAMIN WITH MINERALS) tablet Take 1 tablet by mouth daily.    . SUMAtriptan (IMITREX) 100 MG tablet TAKE 1 TABLET BY MOUTH 1 TIME. MAY REPEAT IN 2 HOURS IF HEADACHE PERSISTS OR RECURS. MAX 2 TABLETS PER 24 HOURS 9 tablet 0  . zolpidem (AMBIEN) 10 MG tablet Take 0.5-1 tablets (5-10 mg total) by mouth at bedtime as needed for sleep. 20 tablet 0   No current facility-administered medications on file prior to visit.     PAST MEDICAL HISTORY: Past Medical History:  Diagnosis Date  . Constipation   . Migraine, menstrual   . Vitamin D deficiency     PAST SURGICAL HISTORY: Past Surgical History:  Procedure Laterality Date  . CESAREAN SECTION     x1  . ENDOMETRIAL ABLATION  08  . KELOID EXCISION     ears  . TUBAL LIGATION      SOCIAL HISTORY: Social History   Tobacco Use  . Smoking status: Never Smoker  . Smokeless tobacco: Never Used  Substance Use Topics  . Alcohol use: Yes    Alcohol/week: 1.0 standard drinks    Types: 1 Glasses of wine per week    Comment: a glass of wine every 3-6 months.  . Drug use: No    FAMILY HISTORY: Family History  Problem Relation Age of Onset  . Hypertension Father   . Hyperlipidemia Father   . Obesity Father   . Migraines Sister   . Hypertension Mother   . Obesity Mother   . Diabetes Maternal Grandmother   . Cancer Maternal Grandfather        prostate cancer (65)    ROS: Review of Systems  Constitutional: Positive for malaise/fatigue and weight loss.  Cardiovascular: Negative for chest pain and claudication.  Gastrointestinal: Negative for nausea and vomiting.  Musculoskeletal: Negative for myalgias.       Negative muscle weakness    PHYSICAL EXAM: Blood pressure 120/74, pulse (!) 57, temperature 97.6 F (36.4 C), temperature source Oral,  height 5\' 6"  (1.676 m), weight 223 lb (101.2 kg), SpO2 98 %. Body mass index is 35.99 kg/m. Physical Exam Vitals signs reviewed.  Constitutional:      Appearance: Normal appearance. She is obese.  Cardiovascular:     Rate and Rhythm: Normal rate.     Pulses: Normal pulses.  Pulmonary:     Effort: Pulmonary effort is normal.     Breath sounds: Normal breath sounds.  Musculoskeletal: Normal range of motion.  Skin:    General: Skin is warm and dry.  Neurological:     Mental Status: She is alert and oriented to person, place, and time.  Psychiatric:        Mood and Affect: Mood normal.        Behavior: Behavior normal.     RECENT LABS AND TESTS: BMET    Component Value Date/Time   NA 139 05/09/2018 1238   K 4.5 05/09/2018 1238  CL 100 05/09/2018 1238   CO2 22 05/09/2018 1238   GLUCOSE 81 05/09/2018 1238   GLUCOSE 82 09/18/2015 0903   BUN 12 05/09/2018 1238   CREATININE 0.69 05/09/2018 1238   CREATININE 0.70 09/18/2015 0903   CALCIUM 9.6 05/09/2018 1238   GFRNONAA 102 05/09/2018 1238   GFRAA 117 05/09/2018 1238   Lab Results  Component Value Date   HGBA1C 5.5 05/09/2018   HGBA1C 5.6 12/29/2016   Lab Results  Component Value Date   INSULIN 5.7 05/09/2018   INSULIN 12.7 12/29/2016   CBC    Component Value Date/Time   WBC 6.0 05/09/2018 1238   WBC 6.3 09/18/2015 0903   RBC 4.11 05/09/2018 1238   RBC 4.05 09/18/2015 0903   HGB 12.5 05/09/2018 1238   HCT 37.4 05/09/2018 1238   PLT 259 09/18/2015 0903   MCV 91 05/09/2018 1238   MCH 30.4 05/09/2018 1238   MCH 30.9 09/18/2015 0903   MCHC 33.4 05/09/2018 1238   MCHC 33.2 09/18/2015 0903   RDW 13.0 05/09/2018 1238   LYMPHSABS 1.6 05/09/2018 1238   MONOABS 441 09/18/2015 0903   EOSABS 0.1 05/09/2018 1238   BASOSABS 0.0 05/09/2018 1238   Iron/TIBC/Ferritin/ %Sat No results found for: IRON, TIBC, FERRITIN, IRONPCTSAT Lipid Panel     Component Value Date/Time   CHOL 206 (H) 05/09/2018 1238   TRIG 62  05/09/2018 1238   HDL 60 05/09/2018 1238   CHOLHDL 3.5 09/23/2016 0927   VLDL 15 09/23/2016 0927   LDLCALC 134 (H) 05/09/2018 1238   Hepatic Function Panel     Component Value Date/Time   PROT 7.4 05/09/2018 1238   ALBUMIN 4.3 05/09/2018 1238   AST 17 05/09/2018 1238   ALT 10 05/09/2018 1238   ALKPHOS 96 05/09/2018 1238   BILITOT 0.4 05/09/2018 1238      Component Value Date/Time   TSH 1.590 05/09/2018 1238   TSH 1.710 12/29/2016 1039   TSH 1.16 09/23/2016 0927      OBESITY BEHAVIORAL INTERVENTION VISIT  Today's visit was # 6   Starting weight: 253 lbs Starting date: 12/29/16 Today's weight : 223 lbs  Today's date: 05/23/2018 Total lbs lost to date: 30    ASK: We discussed the diagnosis of obesity with Anita Stanley A Taylor today and Anita Taylor agreed to give [place] permission to discuss obesity behavioral modification therapy today.  ASSESS: Jakayla has the diagnosis of obesity and her BMI today is 36.01 Dnasia is in the action stage of change   ADVISE: Stachia was educated on the multiple health risks of obesity as well as the benefit of weight loss to improve her health. She was advised of the need for long term treatment and the importance of lifestyle modifications to improve her current health and to decrease her risk of future health problems.  AGREE: Multiple dietary modification options and treatment options were discussed and  Terry agreed to follow the recommendations documented in the above note.  ARRANGE: Ysabel was educated on the importance of frequent visits to treat obesity as outlined per CMS and USPSTF guidelines and agreed to schedule her next follow up appointment today.  I, Burt Knack, am acting as transcriptionist for Debbra Riding, MD  I have reviewed the above documentation for accuracy and completeness, and I agree with the above. - Debbra Riding, MD

## 2018-06-08 ENCOUNTER — Ambulatory Visit (INDEPENDENT_AMBULATORY_CARE_PROVIDER_SITE_OTHER): Payer: BC Managed Care – PPO | Admitting: Family Medicine

## 2018-06-08 ENCOUNTER — Encounter (INDEPENDENT_AMBULATORY_CARE_PROVIDER_SITE_OTHER): Payer: Self-pay | Admitting: Family Medicine

## 2018-06-08 VITALS — BP 103/69 | HR 57 | Temp 97.8°F | Ht 66.0 in | Wt 220.0 lb

## 2018-06-08 DIAGNOSIS — E7849 Other hyperlipidemia: Secondary | ICD-10-CM | POA: Diagnosis not present

## 2018-06-08 DIAGNOSIS — Z6835 Body mass index (BMI) 35.0-35.9, adult: Secondary | ICD-10-CM

## 2018-06-08 DIAGNOSIS — Z9189 Other specified personal risk factors, not elsewhere classified: Secondary | ICD-10-CM | POA: Diagnosis not present

## 2018-06-08 DIAGNOSIS — E559 Vitamin D deficiency, unspecified: Secondary | ICD-10-CM | POA: Diagnosis not present

## 2018-06-08 MED ORDER — VITAMIN D (ERGOCALCIFEROL) 1.25 MG (50000 UNIT) PO CAPS: 50000.0000 [IU] | ORAL_CAPSULE | ORAL | 0 refills | Status: DC

## 2018-06-09 NOTE — Progress Notes (Signed)
Office: [phone]  /  Fax: [phone]   HPI:   Chief Complaint: OBESITY Anita Taylor is here to discuss her progress with her obesity treatment plan. She is on the Category 2 plan and is following her eating plan approximately 80 % of the time. She states she is walking for 30 minutes 2 times per week. Anita Taylor is struggling to get all meat in at dinner, getting in around 5 oz at dinner. She is doing popcorn and cookies for snacks.  Her weight is 220 lb (99.8 kg) today and has had a weight loss of 3 pounds over a period of 2 to 3 weeks since her last visit. She has lost 23 lbs since starting treatment with [place].  Vitamin D Deficiency Anita Taylor has a diagnosis of vitamin D deficiency. She is currently taking prescription Vit D. She notes fatigue and denies nausea, vomiting or muscle weakness.  At risk for osteopenia and osteoporosis Anita Taylor is at higher risk of osteopenia and osteoporosis due to vitamin D deficiency.   Hyperlipidemia Anita Taylor has hyperlipidemia and has been trying to improve her cholesterol levels with intensive lifestyle modification including a low saturated fat diet, exercise and weight loss. She is not on statin and LDL is of 134. She denies any chest pain, claudication or myalgias.  ASSESSMENT AND PLAN:  Vitamin D deficiency - Plan: Vitamin D, Ergocalciferol, (DRISDOL) 1.25 MG (50000 UT) CAPS capsule  Other hyperlipidemia  At risk for osteoporosis  Class 2 severe obesity with serious comorbidity and body mass index (BMI) of 35.0 to 35.9 in adult, unspecified obesity type (HCC)  PLAN:  Vitamin D Deficiency Anita Taylor was informed that low vitamin D levels contributes to fatigue and are associated with obesity, breast, and colon cancer. Anita Taylor agrees to continue taking prescription Vit D @50 ,000 IU every week #4 and we will refill for 1 month. She will follow up for routine testing of vitamin D, at least 2-3 times per year. She was informed of the risk of over-replacement of vitamin D and  agrees to not increase her dose unless she discusses this with [place] first. Anita Taylor agrees to follow up with our clinic in 2 weeks.  At risk for osteopenia and osteoporosis Anita Taylor was given extended (15 minutes) osteoporosis prevention counseling today. Anita Taylor is at risk for osteopenia and osteoporsis due to her vitamin D deficiency. She was encouraged to take her vitamin D and follow her higher calcium diet and increase strengthening exercise to help strengthen her bones and decrease her risk of osteopenia and osteoporosis.  Hyperlipidemia Anita Taylor was informed of the American Heart Association Guidelines emphasizing intensive lifestyle modifications as the first line treatment for hyperlipidemia. We discussed many lifestyle modifications today in depth, and Anita Taylor will continue to work on decreasing saturated fats such as fatty red meat, butter and many fried foods. She will also increase vegetables and lean protein in her diet and continue to work on exercise and weight loss efforts. We will repeat labs in 2 months. Anita Taylor agrees to follow up with our clinic in 2 weeks.  Obesity Anita Taylor is currently in the action stage of change. As such, her goal is to continue with weight loss efforts She has agreed to follow the Category 2 plan Anita Taylor has been instructed to work up to a goal of 150 minutes of combined cardio and strengthening exercise per week for weight loss and overall health benefits. We discussed the following Behavioral Modification Strategies today: increasing lean protein intake, increasing vegetables, work on meal planning and  easy cooking plans, better snacking choices, and planning for success   Anita Taylor has agreed to follow up with our clinic in 2 weeks. She was informed of the importance of frequent follow up visits to maximize her success with intensive lifestyle modifications for her multiple health conditions.  ALLERGIES: No Known Allergies  MEDICATIONS: Current Outpatient Medications on File Prior  to Visit  Medication Sig Dispense Refill  . Cholecalciferol (VITAMIN D-3) 1000 units CAPS Take 1 capsule by mouth daily.    . Multiple Vitamins-Minerals (MULTIVITAMIN WITH MINERALS) tablet Take 1 tablet by mouth daily.    . SUMAtriptan (IMITREX) 100 MG tablet TAKE 1 TABLET BY MOUTH 1 TIME. MAY REPEAT IN 2 HOURS IF HEADACHE PERSISTS OR RECURS. MAX 2 TABLETS PER 24 HOURS 9 tablet 0  . zolpidem (AMBIEN) 10 MG tablet Take 0.5-1 tablets (5-10 mg total) by mouth at bedtime as needed for sleep. 20 tablet 0   No current facility-administered medications on file prior to visit.     PAST MEDICAL HISTORY: Past Medical History:  Diagnosis Date  . Constipation   . Migraine, menstrual   . Vitamin D deficiency     PAST SURGICAL HISTORY: Past Surgical History:  Procedure Laterality Date  . CESAREAN SECTION     x1  . ENDOMETRIAL ABLATION  08  . KELOID EXCISION     ears  . TUBAL LIGATION      SOCIAL HISTORY: Social History   Tobacco Use  . Smoking status: Never Smoker  . Smokeless tobacco: Never Used  Substance Use Topics  . Alcohol use: Yes    Alcohol/week: 1.0 standard drinks    Types: 1 Glasses of wine per week    Comment: a glass of wine every 3-6 months.  . Drug use: No    FAMILY HISTORY: Family History  Problem Relation Age of Onset  . Hypertension Father   . Hyperlipidemia Father   . Obesity Father   . Migraines Sister   . Hypertension Mother   . Obesity Mother   . Diabetes Maternal Grandmother   . Cancer Maternal Grandfather        prostate cancer (65)    ROS: Review of Systems  Constitutional: Positive for malaise/fatigue and weight loss.  Cardiovascular: Negative for chest pain and claudication.  Gastrointestinal: Negative for nausea and vomiting.  Musculoskeletal: Negative for myalgias.       Negative muscle weakness    PHYSICAL EXAM: Blood pressure 103/69, pulse (!) 57, temperature 97.8 F (36.6 C), temperature source Oral, height 5\' 6"  (1.676 m), weight  220 lb (99.8 kg), SpO2 100 %. Body mass index is 35.51 kg/m. Physical Exam Vitals signs reviewed.  Constitutional:      Appearance: Normal appearance. She is obese.  Cardiovascular:     Rate and Rhythm: Normal rate.     Pulses: Normal pulses.  Pulmonary:     Effort: Pulmonary effort is normal.     Breath sounds: Normal breath sounds.  Musculoskeletal: Normal range of motion.  Skin:    General: Skin is warm and dry.  Neurological:     Mental Status: She is alert and oriented to person, place, and time.  Psychiatric:        Mood and Affect: Mood normal.        Behavior: Behavior normal.     RECENT LABS AND TESTS: BMET    Component Value Date/Time   NA 139 05/09/2018 1238   K 4.5 05/09/2018 1238   CL 100 05/09/2018 1238  CO2 22 05/09/2018 1238   GLUCOSE 81 05/09/2018 1238   GLUCOSE 82 09/18/2015 0903   BUN 12 05/09/2018 1238   CREATININE 0.69 05/09/2018 1238   CREATININE 0.70 09/18/2015 0903   CALCIUM 9.6 05/09/2018 1238   GFRNONAA 102 05/09/2018 1238   GFRAA 117 05/09/2018 1238   Lab Results  Component Value Date   HGBA1C 5.5 05/09/2018   HGBA1C 5.6 12/29/2016   Lab Results  Component Value Date   INSULIN 5.7 05/09/2018   INSULIN 12.7 12/29/2016   CBC    Component Value Date/Time   WBC 6.0 05/09/2018 1238   WBC 6.3 09/18/2015 0903   RBC 4.11 05/09/2018 1238   RBC 4.05 09/18/2015 0903   HGB 12.5 05/09/2018 1238   HCT 37.4 05/09/2018 1238   PLT 259 09/18/2015 0903   MCV 91 05/09/2018 1238   MCH 30.4 05/09/2018 1238   MCH 30.9 09/18/2015 0903   MCHC 33.4 05/09/2018 1238   MCHC 33.2 09/18/2015 0903   RDW 13.0 05/09/2018 1238   LYMPHSABS 1.6 05/09/2018 1238   MONOABS 441 09/18/2015 0903   EOSABS 0.1 05/09/2018 1238   BASOSABS 0.0 05/09/2018 1238   Iron/TIBC/Ferritin/ %Sat No results found for: IRON, TIBC, FERRITIN, IRONPCTSAT Lipid Panel     Component Value Date/Time   CHOL 206 (H) 05/09/2018 1238   TRIG 62 05/09/2018 1238   HDL 60 05/09/2018  1238   CHOLHDL 3.5 09/23/2016 0927   VLDL 15 09/23/2016 0927   LDLCALC 134 (H) 05/09/2018 1238   Hepatic Function Panel     Component Value Date/Time   PROT 7.4 05/09/2018 1238   ALBUMIN 4.3 05/09/2018 1238   AST 17 05/09/2018 1238   ALT 10 05/09/2018 1238   ALKPHOS 96 05/09/2018 1238   BILITOT 0.4 05/09/2018 1238      Component Value Date/Time   TSH 1.590 05/09/2018 1238   TSH 1.710 12/29/2016 1039   TSH 1.16 09/23/2016 0927      OBESITY BEHAVIORAL INTERVENTION VISIT  Today's visit was # 7   Starting weight: 243 lbs Starting date: 12/29/2016 Today's weight : 220 lbs Today's date: 06/08/2018 Total lbs lost to date: 7623    ASK: We discussed the diagnosis of obesity with Anita Taylor today and Anita StanleyLisa agreed to give us permission to discuss obesity behavioral modification therapy today.  ASSESS: Anita StanleyLisa has the diagnosis of obesity and her BMI today is 35.53 Anita StanleyLisa is in the action stage of change   ADVISE: Anita StanleyLisa was educated on the multiple health risks of obesity as well as the benefit of weight loss to improve her health. She was advised of the need for long term treatment and the importance of lifestyle modifications to improve her current health and to decrease her risk of future health problems.  AGREE: Multiple dietary modification options and treatment options were discussed and  Anita StanleyLisa agreed to follow the recommendations documented in the above note.  ARRANGE: Anita StanleyLisa was educated on the importance of frequent visits to treat obesity as outlined per CMS and USPSTF guidelines and agreed to schedule her next follow up appointment today.  I, Burt KnackSharon Martin, am acting as transcriptionist for Debbra RidingAlexandria Kadolph, MD  I have reviewed the above documentation for accuracy and completeness, and I agree with the above. - Debbra RidingAlexandria Kadolph, MD

## 2018-06-26 ENCOUNTER — Ambulatory Visit (INDEPENDENT_AMBULATORY_CARE_PROVIDER_SITE_OTHER): Payer: BC Managed Care – PPO | Admitting: Family Medicine

## 2018-06-29 ENCOUNTER — Ambulatory Visit (INDEPENDENT_AMBULATORY_CARE_PROVIDER_SITE_OTHER): Payer: BC Managed Care – PPO | Admitting: Family Medicine

## 2018-06-29 ENCOUNTER — Encounter (INDEPENDENT_AMBULATORY_CARE_PROVIDER_SITE_OTHER): Payer: Self-pay | Admitting: Family Medicine

## 2018-06-29 VITALS — BP 112/70 | HR 54 | Temp 97.9°F | Ht 66.0 in | Wt 217.0 lb

## 2018-06-29 DIAGNOSIS — E7849 Other hyperlipidemia: Secondary | ICD-10-CM

## 2018-06-29 DIAGNOSIS — E559 Vitamin D deficiency, unspecified: Secondary | ICD-10-CM | POA: Diagnosis not present

## 2018-06-29 DIAGNOSIS — Z9189 Other specified personal risk factors, not elsewhere classified: Secondary | ICD-10-CM | POA: Diagnosis not present

## 2018-06-29 DIAGNOSIS — Z6835 Body mass index (BMI) 35.0-35.9, adult: Secondary | ICD-10-CM

## 2018-06-29 MED ORDER — VITAMIN D (ERGOCALCIFEROL) 1.25 MG (50000 UNIT) PO CAPS: 50000.0000 [IU] | ORAL_CAPSULE | ORAL | 0 refills | Status: DC

## 2018-07-01 NOTE — Progress Notes (Signed)
Office: [phone]  /  Fax: [phone]   HPI:   Chief Complaint: OBESITY Anita Taylor is here to discuss her progress with her obesity treatment plan. She is on the Category 2 plan and is following her eating plan approximately 90-95 % of the time. She states she is walking for 30 minutes 4 times per week. Anita Taylor walked 4 times in the past week secondary to how busy her schedule has been. She is planning meals and taking lunch has made following the plan easier. She has plans to go out next weekend with her girlfriends.  Her weight is 217 lb (98.4 kg) today and has had a weight loss of 3 pounds over a period of 3 weeks since her last visit. She has lost 36 lbs since starting treatment with [place].  Vitamin D Deficiency Anita Taylor has a diagnosis of vitamin D deficiency. She is currently taking prescription Vit D. She notes fatigue and denies nausea, vomiting or muscle weakness.  At risk for osteopenia and osteoporosis Anita Taylor is at higher risk of osteopenia and osteoporosis due to vitamin D deficiency.   Hyperlipidemia Anita Taylor has hyperlipidemia and has been trying to improve her cholesterol levels with intensive lifestyle modification including a low saturated fat diet, exercise and weight loss. She is not on statin and denies any chest pain, claudication or myalgias.  ASSESSMENT AND PLAN:  Vitamin D deficiency - Plan: Vitamin D, Ergocalciferol, (DRISDOL) 1.25 MG (50000 UT) CAPS capsule  Other hyperlipidemia  At risk for osteoporosis  Class 2 severe obesity with serious comorbidity and body mass index (BMI) of 35.0 to 35.9 in adult, unspecified obesity type (HCC)  PLAN:  Vitamin D Deficiency Anita Taylor was informed that low vitamin D levels contributes to fatigue and are associated with obesity, breast, and colon cancer. Anita Taylor agrees to continue taking prescription Vit D @50 ,000 IU every week #4 and we will refill for 1 month. She will follow up for routine testing of vitamin D, at least 2-3 times per year.  She was informed of the risk of over-replacement of vitamin D and agrees to not increase her dose unless she discusses this with [place] first. Anita Taylor agrees to follow up with our clinic in 3 weeks.  At risk for osteopenia and osteoporosis Anita Taylor was given extended (15 minutes) osteoporosis prevention counseling today. Anita Taylor is at risk for osteopenia and osteoporsis due to her vitamin D deficiency. She was encouraged to take her vitamin D and follow her higher calcium diet and increase strengthening exercise to help strengthen her bones and decrease her risk of osteopenia and osteoporosis.  Hyperlipidemia Anita Taylor was informed of the American Heart Association Guidelines emphasizing intensive lifestyle modifications as the first line treatment for hyperlipidemia. We discussed many lifestyle modifications today in depth, and Anita Taylor will continue to work on decreasing saturated fats such as fatty red meat, butter and many fried foods. She will also increase vegetables and lean protein in her diet and continue to work on exercise and weight loss efforts. We will repeat labs in mid April. Anita Taylor agrees to follow up with our clinic in 3 weeks.  Obesity Anita Taylor is currently in the action stage of change. As such, her goal is to continue with weight loss efforts She has agreed to keep a food journal with 400-500 calories and 35+ grams of protein at supper daily and follow the Category 2 plan Anita Taylor has been instructed to work up to a goal of 150 minutes of combined cardio and strengthening exercise per week for weight  loss and overall health benefits. We discussed the following Behavioral Modification Strategies today: increasing lean protein intake, increasing vegetables, work on meal planning and easy cooking plans, better snacking choices, and planning for success   Anita Taylor has agreed to follow up with our clinic in 3 weeks. She was informed of the importance of frequent follow up visits to maximize her success with intensive  lifestyle modifications for her multiple health conditions.  ALLERGIES: No Known Allergies  MEDICATIONS: Current Outpatient Medications on File Prior to Visit  Medication Sig Dispense Refill  . Cholecalciferol (VITAMIN D-3) 1000 units CAPS Take 1 capsule by mouth daily.    . Multiple Vitamins-Minerals (MULTIVITAMIN WITH MINERALS) tablet Take 1 tablet by mouth daily.    . SUMAtriptan (IMITREX) 100 MG tablet TAKE 1 TABLET BY MOUTH 1 TIME. MAY REPEAT IN 2 HOURS IF HEADACHE PERSISTS OR RECURS. MAX 2 TABLETS PER 24 HOURS 9 tablet 0  . zolpidem (AMBIEN) 10 MG tablet Take 0.5-1 tablets (5-10 mg total) by mouth at bedtime as needed for sleep. 20 tablet 0   No current facility-administered medications on file prior to visit.     PAST MEDICAL HISTORY: Past Medical History:  Diagnosis Date  . Constipation   . Migraine, menstrual   . Vitamin D deficiency     PAST SURGICAL HISTORY: Past Surgical History:  Procedure Laterality Date  . CESAREAN SECTION     x1  . ENDOMETRIAL ABLATION  08  . KELOID EXCISION     ears  . TUBAL LIGATION      SOCIAL HISTORY: Social History   Tobacco Use  . Smoking status: Never Smoker  . Smokeless tobacco: Never Used  Substance Use Topics  . Alcohol use: Yes    Alcohol/week: 1.0 standard drinks    Types: 1 Glasses of wine per week    Comment: a glass of wine every 3-6 months.  . Drug use: No    FAMILY HISTORY: Family History  Problem Relation Age of Onset  . Hypertension Father   . Hyperlipidemia Father   . Obesity Father   . Migraines Sister   . Hypertension Mother   . Obesity Mother   . Diabetes Maternal Grandmother   . Cancer Maternal Grandfather        prostate cancer (65)    ROS: Review of Systems  Constitutional: Positive for malaise/fatigue and weight loss.  Cardiovascular: Negative for chest pain and claudication.  Gastrointestinal: Negative for nausea and vomiting.  Musculoskeletal: Negative for myalgias.       Negative muscle  weakness    PHYSICAL EXAM: Blood pressure 112/70, pulse (!) 54, temperature 97.9 F (36.6 C), temperature source Oral, height  (1.676 m), weight 217 lb (98.4 kg), SpO2 99 %. Body mass index is 35.02 kg/m. Physical Exam Vitals signs reviewed.  Constitutional:      Appearance: Normal appearance. She is obese.  Cardiovascular:     Rate and Rhythm: Normal rate.     Pulses: Normal pulses.  Pulmonary:     Effort: Pulmonary effort is normal.     Breath sounds: Normal breath sounds.  Musculoskeletal: Normal range of motion.  Skin:    General: Skin is warm and dry.  Neurological:     Mental Status: She is alert and oriented to person, place, and time.  Psychiatric:        Mood and Affect: Mood normal.        Behavior: Behavior normal.     RECENT LABS AND TESTS: BMET  Component Value Date/Time   NA 139 05/09/2018 1238   K 4.5 05/09/2018 1238   CL 100 05/09/2018 1238   CO2 22 05/09/2018 1238   GLUCOSE 81 05/09/2018 1238   GLUCOSE 82 09/18/2015 0903   BUN 12 05/09/2018 1238   CREATININE 0.69 05/09/2018 1238   CREATININE 0.70 09/18/2015 0903   CALCIUM 9.6 05/09/2018 1238   GFRNONAA 102 05/09/2018 1238   GFRAA 117 05/09/2018 1238   Lab Results  Component Value Date   HGBA1C 5.5 05/09/2018   HGBA1C 5.6 12/29/2016   Lab Results  Component Value Date   INSULIN 5.7 05/09/2018   INSULIN 12.7 12/29/2016   CBC    Component Value Date/Time   WBC 6.0 05/09/2018 1238   WBC 6.3 09/18/2015 0903   RBC 4.11 05/09/2018 1238   RBC 4.05 09/18/2015 0903   HGB 12.5 05/09/2018 1238   HCT 37.4 05/09/2018 1238   PLT 259 09/18/2015 0903   MCV 91 05/09/2018 1238   MCH 30.4 05/09/2018 1238   MCH 30.9 09/18/2015 0903   MCHC 33.4 05/09/2018 1238   MCHC 33.2 09/18/2015 0903   RDW 13.0 05/09/2018 1238   LYMPHSABS 1.6 05/09/2018 1238   MONOABS 441 09/18/2015 0903   EOSABS 0.1 05/09/2018 1238   BASOSABS 0.0 05/09/2018 1238   Iron/TIBC/Ferritin/ %Sat No results found for: IRON,  TIBC, FERRITIN, IRONPCTSAT Lipid Panel     Component Value Date/Time   CHOL 206 (H) 05/09/2018 1238   TRIG 62 05/09/2018 1238   HDL 60 05/09/2018 1238   CHOLHDL 3.5 09/23/2016 0927   VLDL 15 09/23/2016 0927   LDLCALC 134 (H) 05/09/2018 1238   Hepatic Function Panel     Component Value Date/Time   PROT 7.4 05/09/2018 1238   ALBUMIN 4.3 05/09/2018 1238   AST 17 05/09/2018 1238   ALT 10 05/09/2018 1238   ALKPHOS 96 05/09/2018 1238   BILITOT 0.4 05/09/2018 1238      Component Value Date/Time   TSH 1.590 05/09/2018 1238   TSH 1.710 12/29/2016 1039   TSH 1.16 09/23/2016 0927      OBESITY BEHAVIORAL INTERVENTION VISIT  Today's visit was # 8   Starting weight: 253 lbs Starting date: 12/29/16 Today's weight : 217 lbs Today's date: 06/29/2018 Total lbs lost to date: 36    06/29/2018  Height  (1.676 m)  Weight 217 lb (98.4 kg)  BMI (Calculated) 35.04  BLOOD PRESSURE - SYSTOLIC 112  BLOOD PRESSURE - DIASTOLIC 70   Body Fat % 28.3 %  Total Body Water (lbs) 81.8 lbs     ASK: We discussed the diagnosis of obesity with Anita Taylor today and Anita Taylor agreed to give [place] permission to discuss obesity behavioral modification therapy today.  ASSESS: Anita Taylor has the diagnosis of obesity and her BMI today is 35.04 Anita Taylor is in the action stage of change   ADVISE: Anita Taylor was educated on the multiple health risks of obesity as well as the benefit of weight loss to improve her health. She was advised of the need for long term treatment and the importance of lifestyle modifications to improve her current health and to decrease her risk of future health problems.  AGREE: Multiple dietary modification options and treatment options were discussed and  Anita Taylor agreed to follow the recommendations documented in the above note.  ARRANGE: Anita Taylor was educated on the importance of frequent visits to treat obesity as outlined per CMS and USPSTF guidelines and agreed to schedule her next follow up  appointment today.  I, Burt Knack, am acting as transcriptionist for Debbra Riding, MD  I have reviewed the above documentation for accuracy and completeness, and I agree with the above. - Debbra Riding, MD

## 2018-07-04 ENCOUNTER — Other Ambulatory Visit: Payer: Self-pay | Admitting: Family Medicine

## 2018-07-04 DIAGNOSIS — G43829 Menstrual migraine, not intractable, without status migrainosus: Secondary | ICD-10-CM

## 2018-07-04 NOTE — Telephone Encounter (Signed)
[facility] is requesting to fill pt Imitrex. Please advise as to Dr. Lynelle Doctor being out the office. KH

## 2018-07-20 ENCOUNTER — Encounter (INDEPENDENT_AMBULATORY_CARE_PROVIDER_SITE_OTHER): Payer: Self-pay

## 2018-07-20 ENCOUNTER — Ambulatory Visit (INDEPENDENT_AMBULATORY_CARE_PROVIDER_SITE_OTHER): Payer: BC Managed Care – PPO | Admitting: Family Medicine

## 2018-08-10 ENCOUNTER — Other Ambulatory Visit: Payer: Self-pay | Admitting: Family Medicine

## 2018-08-10 DIAGNOSIS — Z1231 Encounter for screening mammogram for malignant neoplasm of breast: Secondary | ICD-10-CM

## 2018-10-09 ENCOUNTER — Other Ambulatory Visit: Payer: Self-pay

## 2018-10-09 ENCOUNTER — Ambulatory Visit
Admission: RE | Admit: 2018-10-09 | Discharge: 2018-10-09 | Disposition: A | Discharge status: Home / Self Care | Payer: BC Managed Care – PPO | Source: Ambulatory Visit | Attending: Family Medicine | Admitting: Family Medicine

## 2018-10-09 DIAGNOSIS — Z1231 Encounter for screening mammogram for malignant neoplasm of breast: Secondary | ICD-10-CM

## 2018-10-16 ENCOUNTER — Other Ambulatory Visit: Payer: Self-pay | Admitting: Family Medicine

## 2018-10-16 DIAGNOSIS — G43829 Menstrual migraine, not intractable, without status migrainosus: Secondary | ICD-10-CM

## 2018-10-16 NOTE — Telephone Encounter (Signed)
imitrex--last filled 3/10  #9.  CPE scheduled for next month, 10/2018

## 2018-10-16 NOTE — Telephone Encounter (Signed)
Is this okay to refill? 

## 2018-11-06 ENCOUNTER — Encounter: Payer: Self-pay | Admitting: Family Medicine

## 2018-11-08 ENCOUNTER — Other Ambulatory Visit: Payer: Self-pay | Admitting: Family Medicine

## 2018-11-08 ENCOUNTER — Institutional Professional Consult (permissible substitution): Payer: Self-pay | Admitting: Family Medicine

## 2018-11-08 DIAGNOSIS — G43829 Menstrual migraine, not intractable, without status migrainosus: Secondary | ICD-10-CM

## 2018-11-08 NOTE — Telephone Encounter (Signed)
Patient didn't need. 

## 2018-11-08 NOTE — Telephone Encounter (Signed)
Is this okay to refill? 

## 2018-11-08 NOTE — Telephone Encounter (Signed)
This was last filled for her 10/16/2018. Does she really need this??  I know she had headache related to high BP per last message.  But she used to see neurologist for her headaches.  If having frequent headaches, might need to f/u there. She has appt with me in 2 weeks

## 2018-11-21 NOTE — Patient Instructions (Addendum)
HEALTH MAINTENANCE RECOMMENDATIONS:  It is recommended that you get at least 30 minutes of aerobic exercise at least 5 days/week (for weight loss, you may need as much as 60-90 minutes). This can be any activity that gets your heart rate up. This can be divided in 10-15 minute intervals if needed, but try and build up your endurance at least once a week.  Weight bearing exercise is also recommended twice weekly.  Eat a healthy diet with lots of vegetables, fruits and fiber.  "Colorful" foods have a lot of vitamins (ie green vegetables, tomatoes, red peppers, etc).  Limit sweet tea, regular sodas and alcoholic beverages, all of which has a lot of calories and sugar.  Up to 1 alcoholic drink daily may be beneficial for women (unless trying to lose weight, watch sugars).  Drink a lot of water.  Calcium recommendations are 1200-1500 mg daily (1500 mg for postmenopausal women or women without ovaries), and vitamin D 1000 IU daily.  This should be obtained from diet and/or supplements (vitamins), and calcium should not be taken all at once, but in divided doses.  Monthly self breast exams and yearly mammograms for women over the age of 31 is recommended.  Sunscreen of at least SPF 30 should be used on all sun-exposed parts of the skin when outside between the hours of 10 am and 4 pm (not just when at beach or pool, but even with exercise, golf, tennis, and yard work!)  Use a sunscreen that says "broad spectrum" so it covers both UVA and UVB rays, and make sure to reapply every 1-2 hours.  Remember to change the batteries in your smoke detectors when changing your clock times in the spring and fall. Carbon monoxide detectors are recommended for your home.  Use your seat belt every time you are in a car, and please drive safely and not be distracted with cell phones and texting while driving.  I recommend getting the new shingles vaccine (Shingrix). You will need to check with your insurance to see if it  is covered, and if covered scheduled a nurse visit at our office when convenient.  It is a series of 2 injections, spaced 2 months apart.  Migraines--may be contributed by stress and/or tension/muscular headaches. Work on posture, keep neck neutral.  Do neck stretches and shoulder rolls periodically.  Try heat (or ice, whichever works better), massage. When you have a headache early on (before it is classified as a migraine, ie when still in neck), try taking 800mg  of ibuprofen (every 8 hours with food) OR 2 Aleve (2 pills twice daily with food), or your excedrin migraine.  Use the Imitrex when you have the migraine symptoms, or after an hour or two after the anti-inflammatory if headache is progressing rather than improving.  You are due for colon cancer screening. We have referred you to [facility], who should be contacting you to schedule an appointment.   Fat and Cholesterol Restricted Eating Plan Getting too much fat and cholesterol in your diet may cause health problems. Choosing the right foods helps keep your fat and cholesterol at normal levels. This can keep you from getting certain diseases.  Meal planning  At meals, divide your plate into four equal parts: ? Fill one-half of your plate with vegetables and green salads. ? Fill one-fourth of your plate with whole grains. ? Fill one-fourth of your plate with low-fat (lean) protein foods.  Eat fish that is high in omega-3 fats at least two  times a week. This includes mackerel, tuna, sardines, and salmon.  Eat foods that are high in fiber, such as whole grains, beans, apples, broccoli, carrots, peas, and barley. General tips   Work with your doctor to lose weight if you need to.  Avoid: ? Foods with added sugar. ? Fried foods. ? Foods with partially hydrogenated oils.  Limit alcohol intake to no more than 1 drink a day for nonpregnant women and 2 drinks a day for men. One drink equals 12 oz of beer, 5 oz of wine, or 1 oz of  hard liquor. Reading food labels  Check food labels for: ? Trans fats. ? Partially hydrogenated oils. ? Saturated fat (g) in each serving. ? Cholesterol (mg) in each serving. ? Fiber (g) in each serving.  Choose foods with healthy fats, such as: ? Monounsaturated fats. ? Polyunsaturated fats. ? Omega-3 fats.  Choose grain products that have whole grains. Look for the word "whole" as the first word in the ingredient list. Cooking  Cook foods using low-fat methods. These include baking, boiling, grilling, and broiling.  Eat more home-cooked foods. Eat at restaurants and buffets less often.  Avoid cooking using saturated fats, such as butter, cream, palm oil, palm kernel oil, and coconut oil.   Recommended foods  Fruits  All fresh, canned (in natural juice), or frozen fruits. Vegetables  Fresh or frozen vegetables (raw, steamed, roasted, or grilled). Green salads. Grains  Whole grains, such as whole wheat or whole grain breads, crackers, cereals, and pasta. Unsweetened oatmeal, bulgur, barley, quinoa, or brown rice. Corn or whole wheat flour tortillas. Meats and other protein foods  Ground beef (85% or leaner), grass-fed beef, or beef trimmed of fat. Skinless chicken or [place]. Ground chicken or [place]. Pork trimmed of fat. All fish and seafood. Egg whites. Dried beans, peas, or lentils. Unsalted nuts or seeds. Unsalted canned beans. Nut butters without added sugar or oil. Dairy  Low-fat or nonfat dairy products, such as skim or 1% milk, 2% or reduced-fat cheeses, low-fat and fat-free ricotta or cottage cheese, or plain low-fat and nonfat yogurt. Fats and oils  Tub margarine without trans fats. Light or reduced-fat mayonnaise and salad dressings. Avocado. Olive, canola, sesame, or safflower oils. The items listed above may not be a complete list of foods and beverages you can eat. Contact a dietitian for more information.  Foods to avoid  Fruits  Canned fruit in heavy  syrup. Fruit in cream or butter sauce. Fried fruit. Vegetables  Vegetables cooked in cheese, cream, or butter sauce. Fried vegetables. Grains  White bread. White pasta. White rice. Cornbread. Bagels, pastries, and croissants. Crackers and snack foods that contain trans fat and hydrogenated oils. Meats and other protein foods  Fatty cuts of meat. Ribs, chicken wings, bacon, sausage, bologna, salami, chitterlings, fatback, hot dogs, bratwurst, and packaged lunch meats. Liver and organ meats. Whole eggs and egg yolks. Chicken and [place] with skin. Fried meat. Dairy  Whole or 2% milk, cream, half-and-half, and cream cheese. Whole milk cheeses. Whole-fat or sweetened yogurt. Full-fat cheeses. Nondairy creamers and whipped toppings. Processed cheese, cheese spreads, and cheese curds. Beverages  Alcohol. Sugar-sweetened drinks such as sodas, lemonade, and fruit drinks. Fats and oils  Butter, stick margarine, lard, shortening, ghee, or bacon fat. Coconut, palm kernel, and palm oils. Sweets and desserts  Corn syrup, sugars, honey, and molasses. Candy. Jam and jelly. Syrup. Sweetened cereals. Cookies, pies, cakes, donuts, muffins, and ice cream. The items listed above may not be  a complete list of foods and beverages you should avoid. Contact a dietitian for more information. Summary  Choosing the right foods helps keep your fat and cholesterol at normal levels. This can keep you from getting certain diseases.  At meals, fill one-half of your plate with vegetables and green salads.  Eat high-fiber foods, like whole grains, beans, apples, carrots, peas, and barley.  Limit added sugar, saturated fats, alcohol, and fried foods. This information is not intended to replace advice given to you by your health care provider. Make sure you discuss any questions you have with your health care provider. Document Released: 10/12/2011 Document Revised: 12/14/2017 Document Reviewed: 12/28/2016 Elsevier  Patient Education  2020 [employer]  Menopause Menopause is the normal time of life when menstrual periods stop completely. It is usually confirmed by 12 months without a menstrual period. The transition to menopause (perimenopause) most often happens between the ages of 5145 and 3955. During perimenopause, hormone levels change in your body, which can cause symptoms and affect your health. Menopause may increase your risk for:  Loss of bone (osteoporosis), which causes bone breaks (fractures).  Depression.  Hardening and narrowing of the arteries (atherosclerosis), which can cause heart attacks and strokes. What are the causes? This condition is usually caused by a natural change in hormone levels that happens as you get older. The condition may also be caused by surgery to remove both ovaries (bilateral oophorectomy). What increases the risk? This condition is more likely to start at an earlier age if you have certain medical conditions or treatments, including:  A tumor of the pituitary gland in the brain.  A disease that affects the ovaries and hormone production.  Radiation treatment for cancer.  Certain cancer treatments, such as chemotherapy or hormone (anti-estrogen) therapy.  Heavy smoking and excessive alcohol use.  Family history of early menopause. This condition is also more likely to develop earlier in women who are very thin. What are the signs or symptoms? Symptoms of this condition include:  Hot flashes.  Irregular menstrual periods.  Night sweats.  Changes in feelings about sex. This could be a decrease in sex drive or an increased comfort around your sexuality.  Vaginal dryness and thinning of the vaginal walls. This may cause painful intercourse.  Dryness of the skin and development of wrinkles.  Headaches.  Problems sleeping (insomnia).  Mood swings or irritability.  Memory problems.  Weight gain.  Hair growth on the face and chest.  Bladder  infections or problems with urinating. How is this diagnosed? This condition is diagnosed based on your medical history, a physical exam, your age, your menstrual history, and your symptoms. Hormone tests may also be done. How is this treated? In some cases, no treatment is needed. You and your health care provider should make a decision together about whether treatment is necessary. Treatment will be based on your individual condition and preferences. Treatment for this condition focuses on managing symptoms. Treatment may include:  Menopausal hormone therapy (MHT).  Medicines to treat specific symptoms or complications.  Acupuncture.  Vitamin or herbal supplements. Before starting treatment, make sure to let your health care provider know if you have a personal or family history of:  Heart disease.  Breast cancer.  Blood clots.  Diabetes.  Osteoporosis. Follow these instructions at home: Lifestyle  Do not use any products that contain nicotine or tobacco, such as cigarettes and e-cigarettes. If you need help quitting, ask your health care provider.  Get at least  30 minutes of physical activity on 5 or more days each week.  Avoid alcoholic and caffeinated beverages, as well as spicy foods. This may help prevent hot flashes.  Get 7-8 hours of sleep each night.  If you have hot flashes, try: ? Dressing in layers. ? Avoiding things that may trigger hot flashes, such as spicy food, warm places, or stress. ? Taking slow, deep breaths when a hot flash starts. ? Keeping a fan in your home and office.  Find ways to manage stress, such as deep breathing, meditation, or journaling.  Consider going to group therapy with other women who are having menopause symptoms. Ask your health care provider about recommended group therapy meetings. Eating and drinking  Eat a healthy, balanced diet that contains whole grains, lean protein, low-fat dairy, and plenty of fruits and vegetables.   Your health care provider may recommend adding more soy to your diet. Foods that contain soy include tofu, tempeh, and soy milk.  Eat plenty of foods that contain calcium and vitamin D for bone health. Items that are rich in calcium include low-fat milk, yogurt, beans, almonds, sardines, broccoli, and kale. Medicines  Take over-the-counter and prescription medicines only as told by your health care provider.  Talk with your health care provider before starting any herbal supplements. If prescribed, take vitamins and supplements as told by your health care provider. These may include: ? Calcium. Women age 51 and older should get 1,200 mg (milligrams) of calcium every day. ? Vitamin D. Women need 600-800 International Units of vitamin D each day. ? Vitamins B12 and B6. Aim for 50 micrograms of B12 and 1.5 mg of B6 each day. General instructions  Keep track of your menstrual periods, including: ? When they occur. ? How heavy they are and how long they last. ? How much time passes between periods.  Keep track of your symptoms, noting when they start, how often you have them, and how long they last.  Use vaginal lubricants or moisturizers to help with vaginal dryness and improve comfort during sex.  Keep all follow-up visits as told by your health care provider. This is important. This includes any group therapy or counseling. Contact a health care provider if:  You are still having menstrual periods after age 51.  You have pain during sex.  You have not had a period for 12 months and you develop vaginal bleeding. Get help right away if:  You have: ? Severe depression. ? Excessive vaginal bleeding. ? Pain when you urinate. ? A fast or irregular heart beat (palpitations). ? Severe headaches. ? Abdomen (abdominal) pain or severe indigestion.  You fell and you think you have a broken bone.  You develop leg or chest pain.  You develop vision problems.  You feel a lump in your  breast. Summary  Menopause is the normal time of life when menstrual periods stop completely. It is usually confirmed by 12 months without a menstrual period.  The transition to menopause (perimenopause) most often happens between the ages of 4245 and 8055.  Symptoms can be managed through medicines, lifestyle changes, and complementary therapies such as acupuncture.  Eat a balanced diet that is rich in nutrients to promote bone health and heart health and to manage symptoms during menopause. This information is not intended to replace advice given to you by your health care provider. Make sure you discuss any questions you have with your health care provider. Document Released: 07/03/2003 Document Revised: 03/25/2017 Document Reviewed: 05/15/2016  Elsevier Patient Education  [employer]

## 2018-11-21 NOTE — Progress Notes (Signed)
Chief Complaint  Patient presents with  . Annual Exam    refill needed on imitrex and vitamnin d     Anita Taylor is a 53 y.o. female who presents for a complete physical.  She has the following concerns:  Recently noted elevated blood pressure at the dentist.  She monitored it and BP's came back to normal.  Obesity:  [facility]  Stopped going in Fall 2018 due to the cost, but started going again this year, January through March 2020.  Stopped related to the pandemic (didn't want to do the virtual visits. She just started doing Weight Watchers online last week.  Wt Readings from Last 3 Encounters:  06/29/18 217 lb (98.4 kg)  06/08/18 220 lb (99.8 kg)  05/23/18 223 lb (101.2 kg)   Weight was 256# at her physical 10/2017  Migraines: She uses sumatriptan prn, last filled 10/16/2018, prior to that was 06/2018, 01/2018 (9 tablets).  This remains effective.  She uses Excedrin if taken early in the day (otherwise causes insomnia).  Migraines had been occurring about once a month, lasting 1-2 days.  This month was worse, related to work stress--had 3 this month.  Needed to take two tablets for some of them (headache would start to recur, and took another at bedtime).  Didn't try any ibuprofen or Excedrin first.  She notes it sometimes starts in her neck.  Different position working at home.  Thinks her eyes may contribute, plans to see eye doctor. She needs refill on imitrex today.  At one point headaches were more frequent (3x/month), saw neuro in 11/2016, was started on ER propranolol but that gave her dull headaches so didn't stay on it.  She had thought some of the headaches were hormonal. No longer has cycles. (she never had the MRI or sleep study as recommended by Dr. Jaynee Eagles).  Vitamin D deficiency: This was last treated by Dr. Adair Patter.  Last check was 22.8 in 04/2018. She was treated with 12 weeks of prescription vitamin D through the weight  loss clinic. She is currently taking the same MVI she was taking in January, no additional D supplement.  Postmenopausal--hasn't had a period in over a year.  Elevated [facility] 10/2017  She is having some hot flashes and night sweats, worse with caffeine or certain foods/sweets. Remain tolerable.  H/o borderline hyperlipidemia:Improved in the past with dietary changes. Follows lowfat, low cholesterol diet.Doesn't eat much red meat, cheese, eggs. Last checked upon restarting seeing weight loss clinic.  LDL had been 108 in 12/2016. Lab Results  Component Value Date   CHOL 206 (H) 05/09/2018   HDL 60 05/09/2018   LDLCALC 134 (H) 05/09/2018   TRIG 62 05/09/2018   CHOLHDL 3.5 09/23/2016     Immunization History  Administered Date(s) Administered  . Influenza Split 12/29/2011  . Influenza,inj,Quad PF,6+ Mos 01/28/2016, 01/30/2017  . Influenza-Unspecified 02/02/2017, 02/04/2018  . PPD Test 01/19/2012  . Tdap 01/19/2012   Last Pap smear: 10/2017--normal, no high risk HPV present Last mammogram:09/2018 Last colonoscopy: never Last DEXA: never  Dentist: twice yearly Ophtho:yearly, due Exercise:walking outside once or twice a week (if she can get up early); no longer has a treadmill.  No weight-bearing exercise.  Past Medical History:  Diagnosis Date  . Constipation   . Migraine, menstrual   . Vitamin D deficiency     Past Surgical History:  Procedure Laterality Date  . CESAREAN SECTION     x1  .  ENDOMETRIAL ABLATION  08  . KELOID EXCISION     ears  . TUBAL LIGATION      Social History   Socioeconomic History  . Marital status: Married    Spouse name: Louie Casa  . Number of children: 1  . Years of education: Not on file  . Highest education level: Not on file  Occupational History  . 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 Financial resource strain: Not on file  . Food insecurity    Worry: Not on file    Inability: Not on  file  . Transportation needs    Medical: Not on file    Non-medical: Not on file  Tobacco Use  . Smoking status: Never Smoker  . Smokeless tobacco: Never Used  Substance and Sexual Activity  . Alcohol use: Yes    Alcohol/week: 1.0 standard drinks    Types: 1 Glasses of wine per week    Comment: a glass of wine every 3-6 months.  . Drug use: No  . Sexual activity: Yes    Partners: Male    Birth control/protection: Surgical  Lifestyle  . Physical activity    Days per week: Not on file    Minutes per session: Not on file  . Stress: Not on file  Relationships  . Social Herbalist on phone: Not on file    Gets together: Not on file    Attends religious service: Not on file    Active member of club or organization: Not on file    Attends meetings of clubs or organizations: Not on file    Relationship status: Not on file  . Intimate partner violence    Fear of current or ex partner: Not on file    Emotionally abused: Not on file    Physically abused: Not on file    Forced sexual activity: Not on file  Other Topics Concern  . Not on file  Social History Narrative   Lives with husband, daughter (graduated from [place], in grad school for teaching at [employer]). No smoke exposure   Caffeine: tea and 1-2 diet sodas per week   (updated 10/2018)    Family History  Problem Relation Age of Onset  . Hypertension Father   . Hyperlipidemia Father   . Obesity Father   . Migraines Sister   . Hypertension Mother   . Obesity Mother   . Diabetes Maternal Grandmother   . Cancer Maternal Grandfather        prostate cancer (65)    Outpatient Encounter Medications as of 11/23/2018  Medication Sig  . Multiple Vitamins-Minerals (MULTIVITAMIN WITH MINERALS) tablet Take 1 tablet by mouth daily.  . SUMAtriptan (IMITREX) 100 MG tablet TAKE 1TAB BY MOUTH 1 TIME. MAY REPEAT IN 2 HOURS IF HEADACHE PERSISTS OR RECURS. MAX 2 TABS/24 HOURS  . Cholecalciferol (VITAMIN D-3) 1000 units  CAPS Take 1 capsule by mouth daily.  . Vitamin D, Ergocalciferol, (DRISDOL) 1.25 MG (50000 UT) CAPS capsule Take 1 capsule (50,000 Units total) by mouth every 7 (seven) days. (Patient not taking: Reported on 11/23/2018)  . [DISCONTINUED] zolpidem (AMBIEN) 10 MG tablet Take 0.5-1 tablets (5-10 mg total) by mouth at bedtime as needed for sleep. (Patient not taking: Reported on 11/23/2018)   No facility-administered encounter medications on file as of 11/23/2018.     No Known Allergies  ROS: The patient denies anorexia, fever, decreased hearing, ear pain,  sore throat, breast concerns, chest pain, palpitations, dizziness, syncope, dyspnea on exertion, cough, swelling, nausea, vomiting, diarrhea, constipation, abdominal pain, melena, hematochezia, indigestion/heartburn, hematuria, incontinence, dysuria, vaginal bleeding,discharge, odor or itch, genital lesions, joint pains, numbness, tingling, weakness, tremor, suspicious skin lesions, depression, anxiety, abnormal bleeding/bruising, or enlarged lymph nodes. No hair/skin/bowel/mood changes. Some thinning on the top of her head, with a dry patch. Migraine headaches, and some neck pain, per HPI Hot flashes, night sweats, tolerable Regained some of the weight she had lost.   PHYSICAL EXAM:  BP 110/78   Pulse (!) 56   Temp 98 F (36.7 C) (Oral)   Ht 5' 5.5" (1.664 m)   Wt 235 lb 3.2 oz (106.7 kg)   SpO2 99%   BMI 38.54 kg/m   Wt Readings from Last 3 Encounters:  11/23/18 235 lb 3.2 oz (106.7 kg)  06/29/18 217 lb (98.4 kg)  06/08/18 220 lb (99.8 kg)   256# at her CPE last year  General Appearance:   Alert, cooperative, no distress, appears stated age  Head:   Normocephalic, without obvious abnormality, atraumatic  Eyes:   PERRL, conjunctiva/corneas clear, EOM's intact, fundi benign  Ears:   Normal TM's and external ear canals  Nose:  Not examined (wearing mask due to COVID-19 pandemic)  Throat:  Not examined  (wearing mask due to COVID-19 pandemic)  Neck:  Supple, no lymphadenopathy; thyroid: no enlargement/ tenderness/nodules; no carotidbruit or JVD  Back:  Spine nontender, no curvature, ROM normal, no CVA tenderness  Lungs:   Clear to auscultation bilaterally without wheezes, rales orronchi; respirations unlabored  Chest Wall:   No tenderness or deformity  Heart:   Regular rate and rhythm, S1 and S2 normal, no murmur, rub or gallop  Breast Exam:   No tenderness, masses, or nipple discharge or inversion.No axillary lymphadenopathy  Abdomen:   Soft, non-tender, nondistended, normoactive bowel sounds,   no masses, no hepatosplenomegaly  Genitalia:   Normal external genitalia without lesions. BUS and vagina normal; no cervical motion tenderness. No abnormal vaginal discharge. Uterus and adnexa not enlarged, no masses, though exam somewhat limited due to body habitus; nontender. Pap not performed  Rectal:   Normal tone, no masses or tenderness; guaiac negative stool  Extremities:  No clubbing, cyanosis or edema  Pulses:  2+ and symmetric all extremities  Skin:  Skin color, texture, turgor normal, no rashes or lesions  Lymph nodes:  Cervical, supraclavicular, and axillary nodes normal  Neurologic:  CNII-XII intact, normal strength, sensation and gait; reflexes 2+ and symmetric throughout   Psych: Normal mood, affect, hygiene and grooming  ASSESSMENT/PLAN:  Annual physical exam - Plan: Glucose, random, Lipid panel  Vitamin D deficiency - recheck level, suspect will be low (taking same MVI as she was when low last time, no extra D) - Plan: VITAMIN D 25 Hydroxy (Vit-D Deficiency, Fractures)  Other hyperlipidemia - lowfat, low cholesterol diet reviewed - Plan: Lipid panel  Migraine without status migrainosus, not intractable, unspecified migraine type - Plan: SUMAtriptan (IMITREX) 100 MG  tablet  Colon cancer screening - Plan: Ambulatory referral to Gastroenterology  Class 2 obesity due to excess calories without serious comorbidity with body mass index (BMI) of 38.0 to 38.9 in adult - recently started Weight Watchers   Postmenopausal - counseled re: symptoms, OTC supplements, if needed    Labs were done by [facility] in 04/2018.  Normal c-met, CBC, B12, thyroid studies, insulin level.  Lipids as above.  Only other abnormality was low vitamin D.  D, chol, glu  Migraines--may be contributed by stress and/or tension/muscular headaches. Work on posture, keep neck neutral.  Do neck stretches and shoulder rolls periodically.  Try heat (or ice, whichever works better), massage. When you have a headache early on (before it is classified as a migraine, ie when still in neck), try taking 822m of ibuprofen (every 8 hours with food) OR 2 Aleve (2 pills twice daily with food), or your excedrin migraine.  Use the Imitrex when you have the migraine symptoms, or after an hour or two after the anti-inflammatory if headache is progressing rather than improving.  Shown some stretches/exercises for her neck.  Discussed monthly self breast exams and yearly mammograms; at least 30 minutes of aerobic activity at least 5 days/week, weight-bearing exercise 2-3x/wk; proper sunscreen use reviewed; healthy diet, including goals of calcium and vitamin D intake and alcohol recommendations (less than or equal to 1 drink/day) reviewed; regular seatbelt use; changing batteries in smoke detectors. Immunization recommendations discussed--flu shots recommended yearly, Shingrix recommended, risks/side effects reviewed (she will check with her insurance and schedule NV if desired). Colonoscopy recommendations reviewed, due--she was referred to [name] last year. Opts for another referral (not [facility])   F/u 1 year, sooner prn.

## 2018-11-23 ENCOUNTER — Encounter: Payer: Self-pay | Admitting: Family Medicine

## 2018-11-23 ENCOUNTER — Ambulatory Visit (INDEPENDENT_AMBULATORY_CARE_PROVIDER_SITE_OTHER): Payer: BC Managed Care – PPO | Admitting: Family Medicine

## 2018-11-23 ENCOUNTER — Other Ambulatory Visit: Payer: Self-pay

## 2018-11-23 ENCOUNTER — Encounter: Payer: Self-pay | Admitting: Gastroenterology

## 2018-11-23 VITALS — BP 110/78 | HR 56 | Temp 98.0°F | Ht 65.5 in | Wt 235.2 lb

## 2018-11-23 DIAGNOSIS — E7849 Other hyperlipidemia: Secondary | ICD-10-CM | POA: Diagnosis not present

## 2018-11-23 DIAGNOSIS — G43909 Migraine, unspecified, not intractable, without status migrainosus: Secondary | ICD-10-CM

## 2018-11-23 DIAGNOSIS — Z6838 Body mass index (BMI) 38.0-38.9, adult: Secondary | ICD-10-CM

## 2018-11-23 DIAGNOSIS — E559 Vitamin D deficiency, unspecified: Secondary | ICD-10-CM

## 2018-11-23 DIAGNOSIS — Z780 Asymptomatic menopausal state: Secondary | ICD-10-CM

## 2018-11-23 DIAGNOSIS — Z0000 Encounter for general adult medical examination without abnormal findings: Secondary | ICD-10-CM | POA: Diagnosis not present

## 2018-11-23 DIAGNOSIS — E6609 Other obesity due to excess calories: Secondary | ICD-10-CM

## 2018-11-23 DIAGNOSIS — Z1211 Encounter for screening for malignant neoplasm of colon: Secondary | ICD-10-CM

## 2018-11-23 MED ORDER — SUMATRIPTAN SUCCINATE 100 MG PO TABS: ORAL_TABLET | ORAL | 0 refills | Status: DC

## 2018-11-24 LAB — GLUCOSE, RANDOM: Glucose: 95 mg/dL (ref 65–99)

## 2018-11-24 LAB — VITAMIN D 25 HYDROXY (VIT D DEFICIENCY, FRACTURES): Vit D, 25-Hydroxy: 23.5 ng/mL — ABNORMAL LOW (ref 30.0–100.0)

## 2018-11-24 LAB — LIPID PANEL
Chol/HDL Ratio: 3.4 ratio (ref 0.0–4.4)
Cholesterol, Total: 223 mg/dL — ABNORMAL HIGH (ref 100–199)
HDL: 66 mg/dL (ref >39)
LDL Calculated: 140 mg/dL — ABNORMAL HIGH (ref 0–99)
Triglycerides: 87 mg/dL (ref 0–149)
VLDL Cholesterol Cal: 17 mg/dL (ref 5–40)

## 2018-12-01 ENCOUNTER — Other Ambulatory Visit: Payer: Self-pay

## 2018-12-01 ENCOUNTER — Ambulatory Visit (AMBULATORY_SURGERY_CENTER): Payer: Self-pay | Admitting: *Deleted

## 2018-12-01 VITALS — Ht 65.0 in | Wt 238.0 lb

## 2018-12-01 DIAGNOSIS — Z1211 Encounter for screening for malignant neoplasm of colon: Secondary | ICD-10-CM

## 2018-12-01 MED ORDER — PEG 3350-KCL-NA BICARB-NACL 420 G PO SOLR: 4000.0000 mL | Freq: Once | ORAL | 0 refills | Status: AC

## 2018-12-01 NOTE — Progress Notes (Signed)
No egg or soy allergy known to patient  No issues with past sedation with any surgeries  or procedures, no intubation problems  No diet pills per patient No home 02 use per patient  No blood thinners per patient  Pt has some issues with constipation . Patient takes a supplement for constipation, Mag 0 7, patient taking every other day with results and no problems with daily bm., patient will continue No A fib or A flutter  EMMI information given

## 2018-12-13 ENCOUNTER — Encounter: Payer: Self-pay | Admitting: Gastroenterology

## 2018-12-15 ENCOUNTER — Telehealth: Payer: Self-pay | Admitting: Gastroenterology

## 2018-12-15 NOTE — Telephone Encounter (Signed)
Spoke with patient regarding Covid-19 screening questions. Covid-19 Screening Questions:  Do you now or have you had a fever in the last 14 days? No  Do you have any respiratory symptoms of shortness of breath or cough now or in the last 14 days? mp  Do you have any family members or close contacts with diagnosed or suspected Covid-19 in the past 14 days? no  Have you been tested for Covid-19 and found to be positive? no  Pt made aware of that care partner may wait in the car or come up to the lobby during the procedure but will need to provide their own mask.

## 2018-12-18 ENCOUNTER — Other Ambulatory Visit: Payer: Self-pay

## 2018-12-18 ENCOUNTER — Encounter: Payer: Self-pay | Admitting: Gastroenterology

## 2018-12-18 ENCOUNTER — Ambulatory Visit (AMBULATORY_SURGERY_CENTER): Payer: BC Managed Care – PPO | Admitting: Gastroenterology

## 2018-12-18 VITALS — BP 123/58 | HR 55 | Temp 98.0°F | Resp 17 | Ht 65.0 in | Wt 237.0 lb

## 2018-12-18 DIAGNOSIS — Z1211 Encounter for screening for malignant neoplasm of colon: Secondary | ICD-10-CM

## 2018-12-18 MED ORDER — SODIUM CHLORIDE 0.9 % IV SOLN: 500.0000 mL | Freq: Once | INTRAVENOUS | Status: DC

## 2018-12-18 NOTE — Patient Instructions (Signed)
  Handout given : Hemorrhoids.  YOU HAD AN ENDOSCOPIC PROCEDURE TODAY AT [facility]:   Refer to the procedure report that was given to you for any specific questions about what was found during the examination.  If the procedure report does not answer your questions, please call your gastroenterologist to clarify.  If you requested that your care partner not be given the details of your procedure findings, then the procedure report has been included in a sealed envelope for you to review at your convenience later.  YOU SHOULD EXPECT: Some feelings of bloating in the abdomen. Passage of more gas than usual.  Walking can help get rid of the air that was put into your GI tract during the procedure and reduce the bloating. If you had a lower endoscopy (such as a colonoscopy or flexible sigmoidoscopy) you may notice spotting of blood in your stool or on the toilet paper. If you underwent a bowel prep for your procedure, you may not have a normal bowel movement for a few days.  Please Note:  You might notice some irritation and congestion in your nose or some drainage.  This is from the oxygen used during your procedure.  There is no need for concern and it should clear up in a day or so.  SYMPTOMS TO REPORT IMMEDIATELY:   Following lower endoscopy (colonoscopy or flexible sigmoidoscopy):  Excessive amounts of blood in the stool  Significant tenderness or worsening of abdominal pains  Swelling of the abdomen that is new, acute  Fever of 100F or higher  For urgent or emergent issues, a gastroenterologist can be reached at any hour by calling [phone].   DIET:  We do recommend a small meal at first, but then you may proceed to your regular diet.  Drink plenty of fluids but you should avoid alcoholic beverages for 24 hours.  ACTIVITY:  You should plan to take it easy for the rest of today and you should NOT DRIVE or use heavy machinery until tomorrow (because of the sedation  medicines used during the test).    FOLLOW UP: Our staff will call the number listed on your records 48-72 hours following your procedure to check on you and address any questions or concerns that you may have regarding the information given to you following your procedure. If we do not reach you, we will leave a message.  We will attempt to reach you two times.  During this call, we will ask if you have developed any symptoms of COVID 19. If you develop any symptoms (ie: fever, flu-like symptoms, shortness of breath, cough etc.) before then, please call [phone].  If you test positive for Covid 19 in the 2 weeks post procedure, please call and report this information to [place].    If any biopsies were taken you will be contacted by phone or by letter within the next 1-3 weeks.  Please call us at [phone] if you have not heard about the biopsies in 3 weeks.    SIGNATURES/CONFIDENTIALITY: You and/or your care partner have signed paperwork which will be entered into your electronic medical record.  These signatures attest to the fact that that the information above on your After Visit Summary has been reviewed and is understood.  Full responsibility of the confidentiality of this discharge information lies with you and/or your care-partner.

## 2018-12-18 NOTE — Progress Notes (Signed)
Report to PACU, RN, vss, BBS= Clear.  

## 2018-12-18 NOTE — Op Note (Signed)
[facility] Patient Name: Anita RegalLisa Taylor Procedure Date: 12/18/2018 1:26 PM MRN: [id number] Endoscopist: Rachael Feeaniel P Ilhan Madan , MD Age: [age] Referring MD:  Date of Birth: 05-17-1965 Gender: Female Account #: [id number] Procedure:                Colonoscopy Indications:              Screening for colorectal malignant neoplasm Medicines:                Monitored Anesthesia Care Procedure:                Pre-Anesthesia Assessment:                           - Prior to the procedure, a History and Physical                            was performed, and patient medications and                            allergies were reviewed. The patient's tolerance of                            previous anesthesia was also reviewed. The risks                            and benefits of the procedure and the sedation                            options and risks were discussed with the patient.                            All questions were answered, and informed consent                            was obtained. Prior Anticoagulants: The patient has                            taken no previous anticoagulant or antiplatelet                            agents. ASA Grade Assessment: III - A patient with                            severe systemic disease. After reviewing the risks                            and benefits, the patient was deemed in                            satisfactory condition to undergo the procedure.                           After obtaining informed consent, the colonoscope  was passed under direct vision. Throughout the                            procedure, the patient's blood pressure, pulse, and                            oxygen saturations were monitored continuously. The                            Colonoscope was introduced through the anus and                            advanced to the the cecum, identified by                            appendiceal orifice  and ileocecal valve. The                            colonoscopy was performed without difficulty. The                            patient tolerated the procedure well. The quality                            of the bowel preparation was good. The ileocecal                            valve, appendiceal orifice, and rectum were                            photographed. Scope In: 1:30:54 PM Scope Out: 1:42:49 PM Scope Withdrawal Time: 0 hours 7 minutes 24 seconds  Total Procedure Duration: 0 hours 11 minutes 55 seconds  Findings:                 Internal hemorrhoids were found during                            retroflexion. The hemorrhoids were small.                           The exam was otherwise without abnormality on                            direct and retroflexion views. Complications:            No immediate complications. Estimated blood loss:                            None. Estimated Blood Loss:     Estimated blood loss: none. Impression:               - Small internal hemorrhoids.                           - The examination was otherwise normal on direct  and retroflexion views.                           - No polyps or cancers. Recommendation:           - Patient has a contact number available for                            emergencies. The signs and symptoms of potential                            delayed complications were discussed with the                            patient. Return to normal activities tomorrow.                            Written discharge instructions were provided to the                            patient.                           - Resume previous diet.                           - Continue present medications.                           - Repeat colonoscopy in 10 years for screening. Rachael Feeaniel P Matalynn Graff, MD 12/18/2018 1:46:00 PM This report has been signed electronically.

## 2018-12-18 NOTE — Progress Notes (Signed)
No egg or soy allergy   I have reviewed the patient's medical history in detail and updated the computerized patient record.  [place] vitals

## 2018-12-20 ENCOUNTER — Telehealth: Payer: Self-pay

## 2018-12-20 ENCOUNTER — Telehealth: Payer: Self-pay | Admitting: *Deleted

## 2018-12-20 NOTE — Telephone Encounter (Signed)
No answer, left message to call back later today, B.Teodor Prater RN. 

## 2018-12-20 NOTE — Telephone Encounter (Signed)
Pt called stated she is doing great.

## 2018-12-20 NOTE — Telephone Encounter (Signed)
  Follow up Call-  Call back number 12/18/2018  Post procedure Call Back phone  # [phone]  Permission to leave phone message Yes  Some recent data might be hidden     Patient questions:  Do you have a fever, pain , or abdominal swelling? No. Pain Score  0 *  Have you tolerated food without any problems? Yes.    Have you been able to return to your normal activities? Yes.    Do you have any questions about your discharge instructions: Diet   No. Medications  No. Follow up visit  No.  Do you have questions or concerns about your Care? No.  Actions: * If pain score is 4 or above: No action needed, pain <4.  1. Have you developed a fever since your procedure? no  2.   Have you had an respiratory symptoms (SOB or cough) since your procedure? no  3.   Have you tested positive for COVID 19 since your procedure no  4.   Have you had any family members/close contacts diagnosed with the COVID 19 since your procedure?  no   If yes to any of these questions please route to Joylene John, RN and Alphonsa Gin, Therapist, sports.

## 2018-12-22 ENCOUNTER — Other Ambulatory Visit: Payer: Self-pay

## 2018-12-22 ENCOUNTER — Other Ambulatory Visit (INDEPENDENT_AMBULATORY_CARE_PROVIDER_SITE_OTHER): Payer: Self-pay | Admitting: Family Medicine

## 2018-12-22 DIAGNOSIS — Z20822 Contact with and (suspected) exposure to covid-19: Secondary | ICD-10-CM

## 2018-12-22 DIAGNOSIS — E559 Vitamin D deficiency, unspecified: Secondary | ICD-10-CM

## 2018-12-24 LAB — NOVEL CORONAVIRUS, NAA: SARS-CoV-2, NAA: NOT DETECTED

## 2019-03-30 ENCOUNTER — Other Ambulatory Visit (INDEPENDENT_AMBULATORY_CARE_PROVIDER_SITE_OTHER): Payer: Self-pay | Admitting: Family Medicine

## 2019-03-30 ENCOUNTER — Other Ambulatory Visit: Payer: Self-pay | Admitting: Family Medicine

## 2019-03-30 DIAGNOSIS — G43909 Migraine, unspecified, not intractable, without status migrainosus: Secondary | ICD-10-CM

## 2019-03-30 DIAGNOSIS — E559 Vitamin D deficiency, unspecified: Secondary | ICD-10-CM

## 2019-03-30 NOTE — Telephone Encounter (Signed)
Is this okay to refill? 

## 2019-05-01 ENCOUNTER — Other Ambulatory Visit: Payer: Self-pay | Admitting: Family Medicine

## 2019-05-01 DIAGNOSIS — G43909 Migraine, unspecified, not intractable, without status migrainosus: Secondary | ICD-10-CM

## 2019-06-23 ENCOUNTER — Ambulatory Visit: Payer: BC Managed Care – PPO | Attending: Internal Medicine

## 2019-06-23 DIAGNOSIS — Z23 Encounter for immunization: Secondary | ICD-10-CM | POA: Insufficient documentation

## 2019-06-23 NOTE — Progress Notes (Signed)
   Covid-19 Vaccination Clinic  Name:  Anita Taylor    MRN: [id number] DOB: [birth year]  06/23/2019  Anita Taylor was observed post Covid-19 immunization for 15 minutes without incidence. She was provided with Vaccine Information Sheet and instruction to access the V-Safe system.   Anita Taylor was instructed to call 911 with any severe reactions post vaccine: Marland Kitchen Difficulty breathing  . Swelling of your face and throat  . A fast heartbeat  . A bad rash all over your body  . Dizziness and weakness    Immunizations Administered    Name Date Dose VIS Date Route   Pfizer COVID-19 Vaccine 06/23/2019  2:26 PM 0.3 mL 04/06/2019 Intramuscular   Manufacturer: [employer], [employer]   Lot: [id number]   NDC: [address]-5

## 2019-07-14 ENCOUNTER — Ambulatory Visit: Payer: BC Managed Care – PPO | Attending: Internal Medicine

## 2019-07-14 DIAGNOSIS — Z23 Encounter for immunization: Secondary | ICD-10-CM

## 2019-07-14 NOTE — Progress Notes (Signed)
   Covid-19 Vaccination Clinic  Name:  Anita Taylor    MRN: [id number] DOB: Apr 01, 1966  07/14/2019  [name] was observed post Covid-19 immunization for 15 minutes without incident. She was provided with Vaccine Information Sheet and instruction to access the V-Safe system.   [name] was instructed to call 911 with any severe reactions post vaccine: Marland Kitchen Difficulty breathing  . Swelling of face and throat  . A fast heartbeat  . A bad rash all over body  . Dizziness and weakness   Immunizations Administered    Name Date Dose VIS Date Route   Pfizer COVID-19 Vaccine 07/14/2019  1:10 PM 0.3 mL 04/06/2019 Intramuscular   Manufacturer: [employer], [employer]   Lot: [id number]   NDC: [address]-0

## 2019-09-03 ENCOUNTER — Other Ambulatory Visit: Payer: Self-pay | Admitting: Family Medicine

## 2019-09-03 DIAGNOSIS — Z1231 Encounter for screening mammogram for malignant neoplasm of breast: Secondary | ICD-10-CM

## 2019-09-06 ENCOUNTER — Encounter (INDEPENDENT_AMBULATORY_CARE_PROVIDER_SITE_OTHER): Payer: Self-pay | Admitting: Family Medicine

## 2019-09-06 ENCOUNTER — Other Ambulatory Visit: Payer: Self-pay

## 2019-09-06 ENCOUNTER — Ambulatory Visit (INDEPENDENT_AMBULATORY_CARE_PROVIDER_SITE_OTHER): Payer: BC Managed Care – PPO | Admitting: Family Medicine

## 2019-09-06 VITALS — BP 102/67 | HR 48 | Temp 98.0°F | Ht 66.0 in | Wt 244.0 lb

## 2019-09-06 DIAGNOSIS — E559 Vitamin D deficiency, unspecified: Secondary | ICD-10-CM | POA: Diagnosis not present

## 2019-09-06 DIAGNOSIS — E8881 Metabolic syndrome: Secondary | ICD-10-CM | POA: Diagnosis not present

## 2019-09-06 DIAGNOSIS — R0602 Shortness of breath: Secondary | ICD-10-CM | POA: Diagnosis not present

## 2019-09-06 DIAGNOSIS — R5383 Other fatigue: Secondary | ICD-10-CM | POA: Diagnosis not present

## 2019-09-06 DIAGNOSIS — Z1331 Encounter for screening for depression: Secondary | ICD-10-CM | POA: Diagnosis not present

## 2019-09-06 DIAGNOSIS — Z0289 Encounter for other administrative examinations: Secondary | ICD-10-CM

## 2019-09-06 DIAGNOSIS — Z9189 Other specified personal risk factors, not elsewhere classified: Secondary | ICD-10-CM

## 2019-09-06 DIAGNOSIS — Z6839 Body mass index (BMI) 39.0-39.9, adult: Secondary | ICD-10-CM

## 2019-09-07 LAB — COMPREHENSIVE METABOLIC PANEL
ALT: 11 IU/L (ref 0–32)
AST: 19 IU/L (ref 0–40)
Albumin/Globulin Ratio: 1.4 (ref 1.2–2.2)
Albumin: 4.2 g/dL (ref 3.8–4.9)
Alkaline Phosphatase: 84 IU/L (ref 39–117)
BUN/Creatinine Ratio: 18 (ref 9–23)
BUN: 13 mg/dL (ref 6–24)
Bilirubin Total: 0.3 mg/dL (ref 0.0–1.2)
CO2: 24 mmol/L (ref 20–29)
Calcium: 9.2 mg/dL (ref 8.7–10.2)
Chloride: 103 mmol/L (ref 96–106)
Creatinine, Ser: 0.74 mg/dL (ref 0.57–1.00)
GFR calc Af Amer: 108 mL/min/{1.73_m2} (ref >59)
GFR calc non Af Amer: 93 mL/min/{1.73_m2} (ref >59)
Globulin, Total: 2.9 g/dL (ref 1.5–4.5)
Glucose: 84 mg/dL (ref 65–99)
Potassium: 4.2 mmol/L (ref 3.5–5.2)
Sodium: 141 mmol/L (ref 134–144)
Total Protein: 7.1 g/dL (ref 6.0–8.5)

## 2019-09-07 LAB — CBC WITH DIFFERENTIAL/PLATELET
Basophils Absolute: 0 10*3/uL (ref 0.0–0.2)
Basos: 1 %
EOS (ABSOLUTE): 0.2 10*3/uL (ref 0.0–0.4)
Eos: 3 %
Hematocrit: 37.2 % (ref 34.0–46.6)
Hemoglobin: 12.5 g/dL (ref 11.1–15.9)
Immature Grans (Abs): 0 10*3/uL (ref 0.0–0.1)
Immature Granulocytes: 1 %
Lymphocytes Absolute: 1.7 10*3/uL (ref 0.7–3.1)
Lymphs: 28 %
MCH: 30.7 pg (ref 26.6–33.0)
MCHC: 33.6 g/dL (ref 31.5–35.7)
MCV: 91 fL (ref 79–97)
Monocytes Absolute: 0.4 10*3/uL (ref 0.1–0.9)
Monocytes: 7 %
Neutrophils Absolute: 3.6 10*3/uL (ref 1.4–7.0)
Neutrophils: 60 %
Platelets: 293 10*3/uL (ref 150–450)
RBC: 4.07 x10E6/uL (ref 3.77–5.28)
RDW: 13 % (ref 11.7–15.4)
WBC: 5.9 10*3/uL (ref 3.4–10.8)

## 2019-09-07 LAB — T3: T3, Total: 122 ng/dL (ref 71–180)

## 2019-09-07 LAB — VITAMIN B12: Vitamin B-12: 488 pg/mL (ref 232–1245)

## 2019-09-07 LAB — FOLATE: Folate: 12.6 ng/mL (ref >3.0)

## 2019-09-07 LAB — LIPID PANEL WITH LDL/HDL RATIO
Cholesterol, Total: 190 mg/dL (ref 100–199)
HDL: 63 mg/dL (ref >39)
LDL Chol Calc (NIH): 117 mg/dL — ABNORMAL HIGH (ref 0–99)
LDL/HDL Ratio: 1.9 ratio (ref 0.0–3.2)
Triglycerides: 52 mg/dL (ref 0–149)
VLDL Cholesterol Cal: 10 mg/dL (ref 5–40)

## 2019-09-07 LAB — VITAMIN D 25 HYDROXY (VIT D DEFICIENCY, FRACTURES): Vit D, 25-Hydroxy: 33.9 ng/mL (ref 30.0–100.0)

## 2019-09-07 LAB — INSULIN, RANDOM: INSULIN: 15.9 u[IU]/mL (ref 2.6–24.9)

## 2019-09-07 LAB — HEMOGLOBIN A1C
Est. average glucose Bld gHb Est-mCnc: 114 mg/dL
Hgb A1c MFr Bld: 5.6 % (ref 4.8–5.6)

## 2019-09-07 LAB — TSH: TSH: 1.45 u[IU]/mL (ref 0.450–4.500)

## 2019-09-07 LAB — T4: T4, Total: 7.9 ug/dL (ref 4.5–12.0)

## 2019-09-11 NOTE — Progress Notes (Signed)
Chief Complaint:   OBESITY Anita Taylor (MR# [id number]) is a 54 y.o. female who presents for evaluation and treatment of obesity and related comorbidities. Current BMI is Body mass index is 39.38 kg/m. Anita Taylor has been struggling with her weight for many years and has been unsuccessful in either losing weight, maintaining weight loss, or reaching her healthy weight goal.  Anita Taylor is currently in the action stage of change and ready to dedicate time achieving and maintaining a healthier weight. Anita Taylor is interested in becoming our patient and working on intensive lifestyle modifications including (but not limited to) diet and exercise for weight loss.  Anita Taylor's habits were reviewed today and are as follows: Her family eats meals together, she thinks her family will eat healthier with her, her desired weight loss is 69 lbs, she has been heavy most of her life, she started gaining weight, ongoing, her heaviest weight ever was 258 pounds, she has significant food cravings issues, she snacks frequently in the evenings, she skips meals frequently, she is frequently drinking liquids with calories, she frequently makes poor food choices, she frequently eats larger portions than normal and she struggles with emotional eating.  Depression Screen Anita Taylor's Food and Mood (modified PHQ-9) score was 7.  Depression screen PHQ 2/9 09/06/2019  Decreased Interest 1  Down, Depressed, Hopeless 1  PHQ - 2 Score 2  Altered sleeping 1  Tired, decreased energy 1  Change in appetite 1  Feeling bad or failure about yourself  1  Trouble concentrating 1  Moving slowly or fidgety/restless 0  Suicidal thoughts 0  PHQ-9 Score 7  Difficult doing work/chores Not difficult at all   Subjective:   1. Other fatigue Anita Taylor admits to daytime somnolence and admits to waking up still tired. Patent has a history of symptoms of daytime fatigue. Anita Taylor generally gets 6 or 7 hours of sleep per night, and states that she has generally  restful sleep. Snoring is present. Apneic episodes are not present. Epworth Sleepiness Score is 7.  2. SOB (shortness of breath) on exertion Anita Taylor notes increasing shortness of breath with exercising and seems to be worsening over time with weight gain. She notes getting out of breath sooner with activity than she used to. This has not gotten worse recently. Anita Taylor denies shortness of breath at rest or orthopnea.  3. Vitamin D deficiency Anita Taylor's Vit D level on 11/23/2018 was 23.5. She is only daily OTC multivitamins.  4. Insulin resistance Anita Taylor's insulin level on 01/08/2017 was 12.7. She is not on metformin  5. At risk for osteoporosis Anita Taylor is at higher risk of osteopenia and osteoporosis due to Vitamin D deficiency.   Assessment/Plan:   1. Other fatigue Anita Taylor does feel that her weight is causing her energy to be lower than it should be. Fatigue may be related to obesity, depression or many other causes. Labs will be ordered, and in the meanwhile, Anita Taylor will focus on self care including making healthy food choices, increasing physical activity and focusing on stress reduction.  - Anita Taylor 12-Lead - Comprehensive metabolic panel - Vitamin B12 - Folate - T3 - T4 - TSH  2. SOB (shortness of breath) on exertion Anita Taylor does feel that she gets out of breath more easily that she used to when she exercises. Athira's shortness of breath appears to be obesity related and exercise induced. She has agreed to work on weight loss and gradually increase exercise to treat her exercise induced shortness of breath. Will continue to  monitor closely.  - Comprehensive metabolic panel - Vitamin T24 - Folate - T3 - T4 - TSH  3. Vitamin D deficiency Low Vitamin D level contributes to fatigue and are associated with obesity, breast, and colon cancer. Anita Taylor will follow-up for routine testing of Vitamin D, at least 2-3 times per year to avoid over-replacement. We will check labs today.  - VITAMIN D 25 Hydroxy (Vit-D  Deficiency, Fractures)  4. Insulin resistance Anita Taylor will start her Category 2 meal plan, and will continue to work on weight loss, exercise, and decreasing simple carbohydrates to help decrease the risk of diabetes. We will check labs today. Anita Taylor agreed to follow-up with [place] as directed to closely monitor her progress.  - Comprehensive metabolic panel - CBC with Differential/Platelet - Hemoglobin A1c - Insulin, random - Lipid Panel With LDL/HDL Ratio  5. Depression screening Anita Taylor had a positive depression screening. Depression is commonly associated with obesity and often results in emotional eating behaviors. We will monitor this closely and work on CBT to help improve the non-hunger eating patterns. Referral to Psychology may be required if no improvement is seen as she continues in our clinic.  6. At risk for osteoporosis Anita Taylor was given approximately 30 minutes of osteoporosis prevention counseling today. Anita Taylor is at risk for osteopenia and osteoporosis due to her Vitamin D deficiency. She was encouraged to take her Vitamin D and follow her higher calcium diet and increase strengthening exercise to help strengthen her bones and decrease her risk of osteopenia and osteoporosis.  Repetitive spaced learning was employed today to elicit superior memory formation and behavioral change.  7. Class 2 severe obesity with serious comorbidity and body mass index (BMI) of 39.0 to 39.9 in adult, unspecified obesity type (HCC) Anita Taylor is currently in the action stage of change and her goal is to continue with weight loss efforts. I recommend Anita Taylor begin the structured treatment plan as follows:  She has agreed to the Category 2 Plan with lean meat equivalents.  Exercise goals: As is.   Behavioral modification strategies: increasing lean protein intake, decreasing simple carbohydrates, no skipping meals and meal planning and cooking strategies.  She was informed of the importance of frequent follow-up  visits to maximize her success with intensive lifestyle modifications for her multiple health conditions. She was informed we would discuss her lab results at her next visit unless there is a critical issue that needs to be addressed sooner. Anita Taylor agreed to keep her next visit at the agreed upon time to discuss these results.  Objective:   Blood pressure 102/67, pulse (!) 48, temperature 98 F (36.7 C), temperature source Oral, height 5\' 6"  (1.676 m), weight 244 lb (110.7 kg), SpO2 100 %. Body mass index is 39.38 kg/m.  Anita Taylor: Normal sinus rhythm, bradycardia rate 54 BPM.  Indirect Calorimeter completed today shows a VO2 of 228 and a REE of 1590.  Her calculated basal metabolic rate is 5809 thus her basal metabolic rate is worse than expected.  General: Cooperative, alert, well developed, in no acute distress. HEENT: Conjunctivae and lids unremarkable. Cardiovascular: Regular rhythm.  Lungs: Normal work of breathing. Neurologic: No focal deficits.   Lab Results  Component Value Date   CREATININE 0.74 09/06/2019   BUN 13 09/06/2019   NA 141 09/06/2019   K 4.2 09/06/2019   CL 103 09/06/2019   CO2 24 09/06/2019   Lab Results  Component Value Date   ALT 11 09/06/2019   AST 19 09/06/2019   ALKPHOS 84  09/06/2019   BILITOT 0.3 09/06/2019   Lab Results  Component Value Date   HGBA1C 5.6 09/06/2019   HGBA1C 5.5 05/09/2018   HGBA1C 5.6 12/29/2016   Lab Results  Component Value Date   INSULIN 15.9 09/06/2019   INSULIN 5.7 05/09/2018   INSULIN 12.7 12/29/2016   Lab Results  Component Value Date   TSH 1.450 09/06/2019   Lab Results  Component Value Date   CHOL 190 09/06/2019   HDL 63 09/06/2019   LDLCALC 117 (H) 09/06/2019   TRIG 52 09/06/2019   CHOLHDL 3.4 11/23/2018   Lab Results  Component Value Date   WBC 5.9 09/06/2019   HGB 12.5 09/06/2019   HCT 37.2 09/06/2019   MCV 91 09/06/2019   PLT 293 09/06/2019   No results found for: IRON, TIBC, FERRITIN  Attestation  Statements:   Reviewed by clinician on day of visit: allergies, medications, problem list, medical history, surgical history, family history, social history, and previous encounter notes.   I, Burt Knack, am acting as transcriptionist for Quillian Quince, MD.  I have reviewed the above documentation for accuracy and completeness, and I agree with the above. - Quillian Quince, MD

## 2019-09-20 ENCOUNTER — Ambulatory Visit (INDEPENDENT_AMBULATORY_CARE_PROVIDER_SITE_OTHER): Payer: BC Managed Care – PPO | Admitting: Family Medicine

## 2019-09-20 ENCOUNTER — Other Ambulatory Visit: Payer: Self-pay

## 2019-09-20 VITALS — BP 100/58 | HR 66 | Ht 66.0 in | Wt 233.0 lb

## 2019-09-20 DIAGNOSIS — Z6837 Body mass index (BMI) 37.0-37.9, adult: Secondary | ICD-10-CM

## 2019-09-20 DIAGNOSIS — Z9189 Other specified personal risk factors, not elsewhere classified: Secondary | ICD-10-CM | POA: Diagnosis not present

## 2019-09-20 DIAGNOSIS — E7849 Other hyperlipidemia: Secondary | ICD-10-CM | POA: Diagnosis not present

## 2019-09-20 DIAGNOSIS — E559 Vitamin D deficiency, unspecified: Secondary | ICD-10-CM | POA: Diagnosis not present

## 2019-09-20 DIAGNOSIS — E88819 Insulin resistance, unspecified: Secondary | ICD-10-CM

## 2019-09-20 DIAGNOSIS — E8881 Metabolic syndrome: Secondary | ICD-10-CM

## 2019-09-20 MED ORDER — VITAMIN D (ERGOCALCIFEROL) 1.25 MG (50000 UNIT) PO CAPS: 50000.0000 [IU] | ORAL_CAPSULE | ORAL | 0 refills | Status: DC

## 2019-09-20 MED ORDER — METFORMIN HCL 500 MG PO TABS: 500.0000 mg | ORAL_TABLET | Freq: Every day | ORAL | 0 refills | Status: DC

## 2019-09-20 NOTE — Progress Notes (Signed)
Chief Complaint:   OBESITY Anita Taylor is here to discuss her progress with her obesity treatment plan along with follow-up of her obesity related diagnoses. Anita Taylor is on the Category 2 Plan and states she is following her eating plan approximately 97% of the time. Anita Taylor states she is exercising with walking away the pounds tape for 30 minutes 7 times per week.  Today's visit was #: 10 Starting weight: 243 lbs Starting date: 12/29/2016 Today's weight: 233 lbs Today's date: 09/20/2019 Total lbs lost to date: 10 Total lbs lost since last in-office visit: 11  Interim History: Anita Taylor felt that the plan was fairly easy to follow. She alternated between eggs and Special K original protein cereal for breakfast. She is glad to be back in the program for the accountability and guidance.  Subjective:   1. Other hyperlipidemia Anita Taylor's lipid panel on 09/06/2019, showed a total cholesterol of 190, triglycerides 52, HDL 63, and LDL 117. She is not on statin therapy. I discussed labs with the patient today.  2. Vitamin D deficiency Anita Taylor's Vit D level on 09/06/2019 was 33.9. She is on OTC Vit D3 5,000 IU q daily. Goal Vit D level is >50. I discussed labs with the patient today.  3. Insulin resistance Anita Taylor's insulin level on 09/06/2019 was 15.9 and A1c 5.6. She is not on metformin however we discussed the risks and benefits of starting the medications to help with insulin resistance and weight loss. I discussed labs with the patient today.  4. At risk for diarrhea Anita Taylor is at higher risk of diarrhea due to starting metformin to treat insulin resistance.  Assessment/Plan:   1. Other hyperlipidemia Cardiovascular risk and specific lipid/LDL goals reviewed. We discussed several lifestyle modifications today and Ayen will continue her Category 2 meal plan and will continue to work on exercise and weight loss efforts. We will recheck labs in 3 months. Orders and follow up as documented in patient record.   2.  Vitamin D deficiency Low Vitamin D level contributes to fatigue and are associated with obesity, breast, and colon cancer. Lyzbeth agreed to to start prescription Vitamin D 50,000 IU every week with no refills, and will continue OTC Vit D as well. will follow-up for routine testing of Vitamin D, at least 2-3 times per year to avoid over-replacement. We will recheck labs in 3 months.  - Vitamin D, Ergocalciferol, (DRISDOL) 1.25 MG (50000 UNIT) CAPS capsule; Take 1 capsule (50,000 Units total) by mouth every 7 (seven) days.  Dispense: 4 capsule; Refill: 0  3. Insulin resistance Anita Taylor will continue her Category 2 meal plan, and will continue to work on weight loss, exercise, and decreasing simple carbohydrates to help decrease the risk of diabetes. Anita Taylor agreed to start metformin 500 mg q daily with no refills. We will recheck labs in 3 months. Anita Taylor agreed to follow-up with [place] as directed to closely monitor her progress.  - metFORMIN (GLUCOPHAGE) 500 MG tablet; Take 1 tablet (500 mg total) by mouth daily with breakfast.  Dispense: 30 tablet; Refill: 0  4. At risk for diarrhea Anita Taylor was given approximately 30 minutes of prevention counseling today. She is 54 y.o. female and has risk factors for diarrhea including medications and changes in diet. We discussed intensive lifestyle modifications today with an emphasis on specific weight loss instructions including dietary strategies.   Repetitive spaced learning was employed today to elicit superior memory formation and behavioral change.  5. Class 2 severe obesity with serious comorbidity and body  mass index (BMI) of 37.0 to 37.9 in adult, unspecified obesity type [facility]) Anita Taylor is currently in the action stage of change. As such, her goal is to continue with weight loss efforts. She has agreed to the Category 2 Plan.   Exercise goals: As is.  Behavioral modification strategies: increasing lean protein intake, decreasing simple carbohydrates, increasing water  intake and meal planning and cooking strategies.  Anita Taylor has agreed to follow-up with our clinic in 2 weeks. She was informed of the importance of frequent follow-up visits to maximize her success with intensive lifestyle modifications for her multiple health conditions.   Objective:   Blood pressure (!) 100/58, pulse 66, height 5\' 6"  (1.676 m), weight 233 lb (105.7 kg), SpO2 98 %. Body mass index is 37.61 kg/m.  General: Cooperative, alert, well developed, in no acute distress. HEENT: Conjunctivae and lids unremarkable. Cardiovascular: Regular rhythm.  Lungs: Normal work of breathing. Neurologic: No focal deficits.   Lab Results  Component Value Date   CREATININE 0.74 09/06/2019   BUN 13 09/06/2019   NA 141 09/06/2019   K 4.2 09/06/2019   CL 103 09/06/2019   CO2 24 09/06/2019   Lab Results  Component Value Date   ALT 11 09/06/2019   AST 19 09/06/2019   ALKPHOS 84 09/06/2019   BILITOT 0.3 09/06/2019   Lab Results  Component Value Date   HGBA1C 5.6 09/06/2019   HGBA1C 5.5 05/09/2018   HGBA1C 5.6 12/29/2016   Lab Results  Component Value Date   INSULIN 15.9 09/06/2019   INSULIN 5.7 05/09/2018   INSULIN 12.7 12/29/2016   Lab Results  Component Value Date   TSH 1.450 09/06/2019   Lab Results  Component Value Date   CHOL 190 09/06/2019   HDL 63 09/06/2019   LDLCALC 117 (H) 09/06/2019   TRIG 52 09/06/2019   CHOLHDL 3.4 11/23/2018   Lab Results  Component Value Date   WBC 5.9 09/06/2019   HGB 12.5 09/06/2019   HCT 37.2 09/06/2019   MCV 91 09/06/2019   PLT 293 09/06/2019   No results found for: IRON, TIBC, FERRITIN  Attestation Statements:   Reviewed by clinician on day of visit: allergies, medications, problem list, medical history, surgical history, family history, social history, and previous encounter notes.   I, Trixie Dredge, am acting as transcriptionist for Dennard Nip, MD.  I have reviewed the above documentation for accuracy and completeness,  and I agree with the above. -  Dennard Nip, MD

## 2019-10-08 ENCOUNTER — Ambulatory Visit (INDEPENDENT_AMBULATORY_CARE_PROVIDER_SITE_OTHER): Payer: BC Managed Care – PPO | Admitting: Family Medicine

## 2019-10-08 ENCOUNTER — Encounter (INDEPENDENT_AMBULATORY_CARE_PROVIDER_SITE_OTHER): Payer: Self-pay | Admitting: Family Medicine

## 2019-10-08 ENCOUNTER — Other Ambulatory Visit: Payer: Self-pay

## 2019-10-08 VITALS — BP 98/64 | HR 54 | Temp 98.3°F | Ht 66.0 in | Wt 230.0 lb

## 2019-10-08 DIAGNOSIS — E8881 Metabolic syndrome: Secondary | ICD-10-CM | POA: Diagnosis not present

## 2019-10-08 DIAGNOSIS — Z6837 Body mass index (BMI) 37.0-37.9, adult: Secondary | ICD-10-CM

## 2019-10-08 DIAGNOSIS — E559 Vitamin D deficiency, unspecified: Secondary | ICD-10-CM

## 2019-10-08 DIAGNOSIS — Z9189 Other specified personal risk factors, not elsewhere classified: Secondary | ICD-10-CM

## 2019-10-08 MED ORDER — METFORMIN HCL 500 MG PO TABS: 500.0000 mg | ORAL_TABLET | Freq: Every day | ORAL | 0 refills | Status: DC

## 2019-10-08 MED ORDER — VITAMIN D (ERGOCALCIFEROL) 1.25 MG (50000 UNIT) PO CAPS: 50000.0000 [IU] | ORAL_CAPSULE | ORAL | 0 refills | Status: DC

## 2019-10-10 ENCOUNTER — Other Ambulatory Visit: Payer: Self-pay

## 2019-10-10 ENCOUNTER — Ambulatory Visit
Admission: RE | Admit: 2019-10-10 | Discharge: 2019-10-10 | Disposition: A | Discharge status: Home / Self Care | Payer: BC Managed Care – PPO | Source: Ambulatory Visit | Attending: Family Medicine | Admitting: Family Medicine

## 2019-10-10 DIAGNOSIS — Z1231 Encounter for screening mammogram for malignant neoplasm of breast: Secondary | ICD-10-CM

## 2019-10-10 NOTE — Progress Notes (Signed)
Chief Complaint:   OBESITY Anita Taylor is here to discuss her progress with her obesity treatment plan along with follow-up of her obesity related diagnoses. Anita Taylor is on the Category 2 Plan and states she is following her eating plan approximately 75-80% of the time. Anita Taylor states she is doing walking away the pounds for 30 minutes 4-5 times per week.  Today's visit was #: 11 Starting weight: 243 lbs Starting date: 12/29/2016 Today's weight: 230 lbs Today's date: 10/08/2019 Total lbs lost to date: 13 Total lbs lost since last in-office visit: 3  Interim History: At Johnanna's last office visit she was given a lot of breakfast options, and she notes it is difficult to find what she likes. She is not eating all of her protein per day. She is going to [place] in the near future to move her daughter in. She is exercising for 30 minutes per day on weekdays only.  Subjective:   1. Insulin resistance Bobetta denies GI side effects of metformin, which was started at her last office visit. Her cravings have decreased and her hunger has decreased with medications. She is tolerating her medications well with no side effects.  2. Vitamin D deficiency Anita Taylor's last Vit D level was 33.9 on 09/06/2019. She denies nausea, vomiting, or muscle weakness.  3. At risk for diabetes mellitus Anita Taylor is at higher than average risk for developing diabetes due to her obesity.   Assessment/Plan:   1. Insulin resistance Anita Taylor will continue to work on weight loss, diet, exercise, and decreasing simple carbohydrates to help decrease the risk of diabetes. We will refill metformin for 1 month. Anita Taylor agreed to follow-up with [place] as directed to closely monitor her progress.  - metFORMIN (GLUCOPHAGE) 500 MG tablet; Take 1 tablet (500 mg total) by mouth daily with breakfast.  Dispense: 30 tablet; Refill: 0  2. Vitamin D deficiency Low Vitamin D level contributes to fatigue and are associated with obesity, breast, and colon cancer. We will  refill prescription Vitamin D for 1 month. Anita Taylor will follow-up for routine testing of Vitamin D, at least 2-3 times per year to avoid over-replacement. We will recheck labs every 3 months.  - Vitamin D, Ergocalciferol, (DRISDOL) 1.25 MG (50000 UNIT) CAPS capsule; Take 1 capsule (50,000 Units total) by mouth every 7 (seven) days.  Dispense: 4 capsule; Refill: 0  3. At risk for diabetes mellitus Anita Taylor was given approximately 15 minutes of diabetes education and counseling today. We discussed intensive lifestyle modifications today with an emphasis on weight loss as well as increasing exercise and decreasing simple carbohydrates in her diet. We also reviewed medication options with an emphasis on risk versus benefit of those discussed.   Repetitive spaced learning was employed today to elicit superior memory formation and behavioral change.  4. Class 2 severe obesity with serious comorbidity and body mass index (BMI) of 37.0 to 37.9 in adult, unspecified obesity type (HCC) Anita Taylor is currently in the action stage of change. As such, her goal is to continue with weight loss efforts. She has agreed to the Category 2 Plan with breakfast options.   The goal for her next office visit is to get protein in daily.  Exercise goals: As is.  Behavioral modification strategies: increasing lean protein intake, no skipping meals, travel eating strategies and planning for success.  Anita Taylor has agreed to follow-up with our clinic in 3 weeks. She was informed of the importance of frequent follow-up visits to maximize her success with intensive lifestyle  modifications for her multiple health conditions.   Objective:   Blood pressure 98/64, pulse (!) 54, temperature 98.3 F (36.8 C), temperature source Oral, height 5\' 6"  (1.676 m), weight 230 lb (104.3 kg), SpO2 99 %. Body mass index is 37.12 kg/m.  General: Cooperative, alert, well developed, in no acute distress. HEENT: Conjunctivae and lids  unremarkable. Cardiovascular: Regular rhythm.  Lungs: Normal work of breathing. Neurologic: No focal deficits.   Lab Results  Component Value Date   CREATININE 0.74 09/06/2019   BUN 13 09/06/2019   NA 141 09/06/2019   K 4.2 09/06/2019   CL 103 09/06/2019   CO2 24 09/06/2019   Lab Results  Component Value Date   ALT 11 09/06/2019   AST 19 09/06/2019   ALKPHOS 84 09/06/2019   BILITOT 0.3 09/06/2019   Lab Results  Component Value Date   HGBA1C 5.6 09/06/2019   HGBA1C 5.5 05/09/2018   HGBA1C 5.6 12/29/2016   Lab Results  Component Value Date   INSULIN 15.9 09/06/2019   INSULIN 5.7 05/09/2018   INSULIN 12.7 12/29/2016   Lab Results  Component Value Date   TSH 1.450 09/06/2019   Lab Results  Component Value Date   CHOL 190 09/06/2019   HDL 63 09/06/2019   LDLCALC 117 (H) 09/06/2019   TRIG 52 09/06/2019   CHOLHDL 3.4 11/23/2018   Lab Results  Component Value Date   WBC 5.9 09/06/2019   HGB 12.5 09/06/2019   HCT 37.2 09/06/2019   MCV 91 09/06/2019   PLT 293 09/06/2019   No results found for: IRON, TIBC, FERRITIN  Attestation Statements:   Reviewed by clinician on day of visit: allergies, medications, problem list, medical history, surgical history, family history, social history, and previous encounter notes.   09/08/2019, am acting as transcriptionist for Trude Mcburney, DO.  I have reviewed the above documentation for accuracy and completeness, and I agree with the above.[employer], DO

## 2019-10-31 ENCOUNTER — Ambulatory Visit (INDEPENDENT_AMBULATORY_CARE_PROVIDER_SITE_OTHER): Payer: BC Managed Care – PPO | Admitting: Family Medicine

## 2019-11-07 ENCOUNTER — Ambulatory Visit (INDEPENDENT_AMBULATORY_CARE_PROVIDER_SITE_OTHER): Payer: BC Managed Care – PPO | Admitting: Family Medicine

## 2019-11-12 ENCOUNTER — Encounter (INDEPENDENT_AMBULATORY_CARE_PROVIDER_SITE_OTHER): Payer: Self-pay | Admitting: Family Medicine

## 2019-11-12 ENCOUNTER — Other Ambulatory Visit: Payer: Self-pay

## 2019-11-12 ENCOUNTER — Ambulatory Visit (INDEPENDENT_AMBULATORY_CARE_PROVIDER_SITE_OTHER): Payer: BC Managed Care – PPO | Admitting: Family Medicine

## 2019-11-12 VITALS — BP 107/72 | HR 55 | Temp 98.1°F | Ht 66.0 in | Wt 224.0 lb

## 2019-11-12 DIAGNOSIS — E8881 Metabolic syndrome: Secondary | ICD-10-CM

## 2019-11-12 DIAGNOSIS — Z6836 Body mass index (BMI) 36.0-36.9, adult: Secondary | ICD-10-CM

## 2019-11-12 DIAGNOSIS — Z9189 Other specified personal risk factors, not elsewhere classified: Secondary | ICD-10-CM | POA: Diagnosis not present

## 2019-11-12 DIAGNOSIS — E7849 Other hyperlipidemia: Secondary | ICD-10-CM

## 2019-11-12 DIAGNOSIS — E559 Vitamin D deficiency, unspecified: Secondary | ICD-10-CM | POA: Diagnosis not present

## 2019-11-12 MED ORDER — VITAMIN D (ERGOCALCIFEROL) 1.25 MG (50000 UNIT) PO CAPS: 50000.0000 [IU] | ORAL_CAPSULE | ORAL | 0 refills | Status: DC

## 2019-11-13 NOTE — Progress Notes (Signed)
Chief Complaint:   OBESITY Anita Taylor is here to discuss her progress with her obesity treatment plan along with follow-up of her obesity related diagnoses. Anita Taylor is on the Category 2 Plan and states she is following her eating plan approximately 50% of the time. Anita Taylor states she is walking for 30 minutes 4 times per week.  Today's visit was #: 12 Starting weight: 243 lbs Starting date: 12/29/2016 Today's weight: 224 lbs Today's date: 11/12/2019 Total lbs lost to date: 19 lbs Total lbs lost since last in-office visit: 6 lbs  Interim History: Anita Taylor has been eating [employer] protein milk and [employer] cereal.  She is making conscientious choices.  She increased her protein at every meal.  She is not measuring all the time.  She also started walking 4-5 days per week for 30 minutes.  She is to see her PCP in August 2021 with full labs.  Subjective:   1. Insulin resistance [name] has a diagnosis of insulin resistance based on her elevated fasting insulin level >5. She continues to work on diet and exercise to decrease her risk of diabetes.  Lab Results  Component Value Date   INSULIN 15.9 09/06/2019   INSULIN 5.7 05/09/2018   INSULIN 12.7 12/29/2016   Lab Results  Component Value Date   HGBA1C 5.6 09/06/2019   2. Vitamin D deficiency Anita Taylor's Vitamin D level was 33.9 on 09/06/2019. She is currently taking prescription vitamin D 50,000 IU each week. She denies nausea, vomiting or muscle weakness.  3. Other hyperlipidemia Anita Taylor has hyperlipidemia and has been trying to improve her cholesterol levels with intensive lifestyle modification including a low saturated fat diet, exercise and weight loss. She denies any chest pain, claudication or myalgias.  Lab Results  Component Value Date   ALT 11 09/06/2019   AST 19 09/06/2019   ALKPHOS 84 09/06/2019   BILITOT 0.3 09/06/2019   Lab Results  Component Value Date   CHOL 190 09/06/2019   HDL 63 09/06/2019   LDLCALC 117 (H) 09/06/2019   TRIG 52  09/06/2019   CHOLHDL 3.4 11/23/2018   4. At risk for heart disease Anita Taylor is at a higher than average risk for cardiovascular disease due to obesity, hyperlipidemia, and insulin resistance.   Assessment/Plan:   1. Insulin resistance Anita Taylor will continue to work on weight loss, exercise, and decreasing simple carbohydrates to help decrease the risk of diabetes. Anita Taylor agreed to follow-up with [place] as directed to closely monitor her progress.  Anita Taylor will ask her PCP to obtain insulin level while getting fasting blood work in the near future.  2. Vitamin D deficiency Low Vitamin D level contributes to fatigue and are associated with obesity, breast, and colon cancer. She agrees to continue to take prescription Vitamin D @50 ,000 IU every week and will follow-up for routine testing of Vitamin D, at least 2-3 times per year to avoid over-replacement.  Will have labs next month with PCP. - Vitamin D, Ergocalciferol, (DRISDOL) 1.25 MG (50000 UNIT) CAPS capsule; Take 1 capsule (50,000 Units total) by mouth every 7 (seven) days.  Dispense: 4 capsule; Refill: 0  3. Other hyperlipidemia Cardiovascular risk and specific lipid/LDL goals reviewed.  We discussed several lifestyle modifications today and Anita Taylor will continue to work on diet, exercise and weight loss efforts. Orders and follow up as documented in patient record. Will get labs check with PCP in the near future in August at her yearly physical.  Continue prudent nutritional plan, weight loss, exercise.  Counseling  Intensive lifestyle modifications are the first line treatment for this issue. . Dietary changes: Increase soluble fiber. Decrease simple carbohydrates. . Exercise changes: Moderate to vigorous-intensity aerobic activity 150 minutes per week if tolerated. . Lipid-lowering medications: see documented in medical record.  4. At risk for heart disease Anita Taylor was given approximately 15 minutes of coronary artery disease prevention counseling today. She  is 54 y.o. female and has risk factors for heart disease including obesity. We discussed intensive lifestyle modifications today with an emphasis on specific weight loss instructions and strategies.   Repetitive spaced learning was employed today to elicit superior memory formation and behavioral change.  5. Class 2 severe obesity with serious comorbidity and body mass index (BMI) of 36.0 to 36.9 in adult, unspecified obesity type (HCC) Anita Taylor is currently in the action stage of change. As such, her goal is to continue with weight loss efforts. She has agreed to the Category 2 Plan.   Exercise goals: As is.  Behavioral modification strategies: increasing lean protein intake, measuring proteins/foods, increasing water intake and no skipping meals.  Goal #1:  Continue to increase protein at every meal and try to measure. Goal #2:  Increase exercise to 5 days per week. Goal #3:  Increase water intake to around 100 ounces per day.  Anita Taylor has agreed to follow-up with our clinic in 2 weeks. She was informed of the importance of frequent follow-up visits to maximize her success with intensive lifestyle modifications for her multiple health conditions.   Objective:   Blood pressure 107/72, pulse (!) 55, temperature 98.1 F (36.7 C), temperature source Oral, height 5\' 6"  (1.676 m), weight 224 lb (101.6 kg), SpO2 98 %. Body mass index is 36.15 kg/m.  General: Cooperative, alert, well developed, in no acute distress. HEENT: Conjunctivae and lids unremarkable. Cardiovascular: Regular rhythm.  Lungs: Normal work of breathing. Neurologic: No focal deficits.   Lab Results  Component Value Date   CREATININE 0.74 09/06/2019   BUN 13 09/06/2019   NA 141 09/06/2019   K 4.2 09/06/2019   CL 103 09/06/2019   CO2 24 09/06/2019   Lab Results  Component Value Date   ALT 11 09/06/2019   AST 19 09/06/2019   ALKPHOS 84 09/06/2019   BILITOT 0.3 09/06/2019   Lab Results  Component Value Date   HGBA1C  5.6 09/06/2019   HGBA1C 5.5 05/09/2018   HGBA1C 5.6 12/29/2016   Lab Results  Component Value Date   INSULIN 15.9 09/06/2019   INSULIN 5.7 05/09/2018   INSULIN 12.7 12/29/2016   Lab Results  Component Value Date   TSH 1.450 09/06/2019   Lab Results  Component Value Date   CHOL 190 09/06/2019   HDL 63 09/06/2019   LDLCALC 117 (H) 09/06/2019   TRIG 52 09/06/2019   CHOLHDL 3.4 11/23/2018   Lab Results  Component Value Date   WBC 5.9 09/06/2019   HGB 12.5 09/06/2019   HCT 37.2 09/06/2019   MCV 91 09/06/2019   PLT 293 09/06/2019   Attestation Statements:   Reviewed by clinician on day of visit: allergies, medications, problem list, medical history, surgical history, family history, social history, and previous encounter notes.  I, 09/08/2019, CMA, am acting as Insurance claims handler for Energy manager, DO.  I have reviewed the above documentation for accuracy and completeness, and I agree with the above. [employer], DO

## 2019-12-02 NOTE — Progress Notes (Signed)
Chief Complaint  Patient presents with  . Annual Exam    annual exam wih pelvic. Sees eye doctor for eye exams. No new concerns.     Anita Taylor is a 54 y.o. female who presents for a complete physical.  She has the following concerns:  Obesity: She continues care at [facility].  She is being treated with metformin for insulin resistance. Lab Results  Component Value Date   HGBA1C 5.6 09/06/2019   Wt Readings from Last 3 Encounters:  11/12/19 224 lb (101.6 kg)  10/08/19 230 lb (104.3 kg)  09/20/19 233 lb (105.7 kg)   Weight was 235# 3.2 oz at her physical 10/2018, and 256# at her physical 10/2017  Migraines: She uses sumatriptan prn, last filled #9 04/2019.  This remains effective.  She uses Excedrin if taken early in the day (otherwise causes insomnia).  Migraines usually occur about once a month or every other month, and not lasting as long or as severe as in the past.  Overall, these have improved over the last year.    (At one point headaches were more frequent (3x/month), saw neuro in 11/2016, was started on ER propranolol but that gave her dull headaches so didn't stay on it.  She had thought some of the headaches were hormonal. No longer has cycles. She never had the MRI or sleep study as recommended by Dr. Jaynee Eagles).  Vitamin D deficiency: This is monitored and treated by [facility].  Last level was 33.9 in 08/2019. She continues on once weekly prescription D.  Postmenopausal--hasn't had a period in over 2 years and elevated [facility] 10/2017. She is having some hot flashes and night sweats, worse with caffeine or certain foods/sweets/wine. Remain tolerable, mainly at night.  H/o borderline hyperlipidemia:Improved in the past with dietary changes. Follows lowfat, low cholesterol diet.Doesn't eat much red meat, cheese, eggs.  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 She had pain in her back/hip flare up again about 6 weeks ago. She got injection for L hip bursitis. She also had pain at her L anterior hip, radiates around to her left low back, feels pulling when she bends forward.  It comes and goes.  They were supposed to send in a prescription, but it never got sent.  She recalls it was supposed to be a pill and an ointment. Denies any radiation, numbness, tingling or weakness.  Immunization History  Administered Date(s) Administered  . Influenza Split 12/29/2011  . Influenza,inj,Quad PF,6+ Mos 01/28/2016, 01/30/2017  . Influenza-Unspecified 02/02/2017, 02/04/2018  . PFIZER SARS-COV-2 Vaccination 06/23/2019, 07/14/2019  . PPD Test 01/19/2012  . Tdap 01/19/2012   Last Pap smear: 10/2017--normal, no high risk HPV present Last mammogram:09/2019 Last colonoscopy: 11/2018, small internal hemorrhoid. Recheck 10 years Last DEXA: never  Dentist: twice yearly Ophtho:yearly Exercise: 30 minutes 4x/week "walk away the pounds" tape, and recently starting adding 2-5# weights while walking.   PMH, PSH, SH and FH reviewed and updated  Outpatient Encounter Medications as of 12/03/2019  Medication Sig  . cholecalciferol (VITAMIN D3) 25 MCG (1000 UNIT) tablet Take 1,000 Units by mouth daily.  . metFORMIN (GLUCOPHAGE) 500 MG tablet Take 1 tablet (500 mg total) by mouth daily with breakfast.  . Multiple Vitamins-Minerals (MULTIVITAMIN WITH MINERALS) tablet  Take 1 tablet by mouth daily.  . Vitamin D, Ergocalciferol, (DRISDOL) 1.25 MG (50000 UNIT) CAPS capsule Take 1 capsule (50,000 Units total) by mouth every 7 (seven) days.  . SUMAtriptan (IMITREX) 100 MG tablet MAY REPEAT IN 2 HOURS IF HEADACHE PERSISTS OR RECURS. (Patient not taking: Reported on 12/03/2019)  . [DISCONTINUED] MOBIC 15 MG tablet Take by mouth.  . [DISCONTINUED] vitamin B-12 (CYANOCOBALAMIN) 500 MCG tablet Take 500  mcg by mouth daily.   No facility-administered encounter medications on file as of 12/03/2019.   No Known Allergies  ROS: The patient denies anorexia, fever, decreased hearing, ear pain, sore throat, breast concerns, chest pain, palpitations, dizziness, syncope, dyspnea on exertion, cough, swelling, nausea, vomiting, diarrhea, constipation, abdominal pain, melena, hematochezia, indigestion/heartburn, hematuria, incontinence, dysuria, vaginal bleeding,discharge, odor or itch, genital lesions, joint pains, numbness, tingling, weakness, tremor, suspicious skin lesions, depression, anxiety, abnormal bleeding/bruising, or enlarged lymph nodes. No hair/skin/bowel/mood changes. Migraine headaches per HPI Hot flashes, night sweats, tolerable   PHYSICAL EXAM:  BP 110/70   Pulse 60   Ht 5' 5.5" (1.664 m)   Wt 227 lb 3.2 oz (103.1 kg)   BMI 37.23 kg/m   Wt Readings from Last 3 Encounters:  12/03/19 227 lb 3.2 oz (103.1 kg)  11/12/19 224 lb (101.6 kg)  10/08/19 230 lb (104.3 kg)    General Appearance:   Alert, cooperative, no distress, appears stated age  Head:   Normocephalic, without obvious abnormality, atraumatic  Eyes:   PERRL, conjunctiva/corneas clear, EOM's intact, fundi benign  Ears:   Normal TM's and external ear canals  Nose:  Not examined (wearing mask due to COVID-19 pandemic)  Throat:  Not examined (wearing mask due to COVID-19 pandemic)  Neck:  Supple, no lymphadenopathy; thyroid: no enlargement/ tenderness/nodules; no carotidbruit or JVD  Back:  Spine nontender, no curvature, ROM normal, no CVA tenderness. Tender at left paraspinous muscles in lumbar region, no significant spasm.  Lungs:   Clear to auscultation bilaterally without wheezes, rales orronchi; respirations unlabored  Chest Wall:   No tenderness or deformity  Heart:   Regular rate and rhythm, S1 and S2 normal, no murmur, rub or gallop  Breast Exam:   No  tenderness, masses, or nipple discharge or inversion.No axillary lymphadenopathy  Abdomen:   Soft, non-tender, nondistended, normoactive bowel sounds,   no masses, no hepatosplenomegaly  Genitalia:   Normal external genitalia without lesions. BUS and vagina normal; no cervical motion tenderness. No abnormal vaginal discharge. Uterus and adnexa not enlarged, no masses or tenderness. Pap not performed  Rectal:   Normal tone, no masses or tenderness; guaiac negative stool  Extremities:  No clubbing, cyanosis or edema  Pulses:  2+ and symmetric all extremities  Skin:  Skin color, texture, turgor normal, no rashes or lesions  Lymph nodes:  Cervical, supraclavicular, and axillary nodes normal  Neurologic:  Normal strength, sensation and gait; reflexes 2+ and symmetric throughout   Psych: Normal mood, affect, hygiene and grooming  Recent labs reviewed  Normal thyroid functions, folate, B12, lipids, vit D, insulin level, A1c, CBC, and C-met, including normal fasting glu.   ASSESSMENT/PLAN:  Annual physical exam - Plan: POCT Urinalysis DIP (Proadvantage Device)  Migraine without status migrainosus, not intractable, unspecified migraine type - infrequent, responds well to imitrex - Plan: SUMAtriptan (IMITREX) 100 MG tablet  Insulin resistance - doing well on metformin  Vitamin D deficiency - adequately replaced; monitored by [facility]  Postmenopausal - symptoms tolerable. Briefly reviewed OTC supplements (which might help  with sleep)  Other hyperlipidemia - improved with dietary measures  Left-sided low back pain without sciatica, unspecified chronicity - discussed heat, massage, proper lifting, stretching.  Core strengthening encouraged. Mobic to use prn flares - Plan: meloxicam (MOBIC) 15 MG tablet   No labs needed--all labs done in May were normal.   Discussed monthly self breast exams and yearly mammograms; at  least 30 minutes of aerobic activity at least 5 days/week, weight-bearing exercise 2-3x/wk; proper sunscreen use reviewed; healthy diet, including goals of calcium and vitamin D intake and alcohol recommendations (less than or equal to 1 drink/day) reviewed; regular seatbelt use; changing batteries in smoke detectors. Immunization recommendations discussed--flu shots recommended yearly, Shingrix recommended, risks/side effects reviewed(she will check with her insurance and schedule [place] when convenient). Colonoscopy recommendations reviewed, UTD.

## 2019-12-02 NOTE — Patient Instructions (Addendum)
HEALTH MAINTENANCE RECOMMENDATIONS:  It is recommended that you get at least 30 minutes of aerobic exercise at least 5 days/week (for weight loss, you may need as much as 60-90 minutes). This can be any activity that gets your heart rate up. This can be divided in 10-15 minute intervals if needed, but try and build up your endurance at least once a week.  Weight bearing exercise is also recommended twice weekly.  Eat a healthy diet with lots of vegetables, fruits and fiber.  "Colorful" foods have a lot of vitamins (ie green vegetables, tomatoes, red peppers, etc).  Limit sweet tea, regular sodas and alcoholic beverages, all of which has a lot of calories and sugar.  Up to 1 alcoholic drink daily may be beneficial for women (unless trying to lose weight, watch sugars).  Drink a lot of water.  Calcium recommendations are 1200-1500 mg daily (1500 mg for postmenopausal women or women without ovaries), and vitamin D 1000 IU daily.  This should be obtained from diet and/or supplements (vitamins), and calcium should not be taken all at once, but in divided doses.  Monthly self breast exams and yearly mammograms for women over the age of 62 is recommended.  Sunscreen of at least SPF 30 should be used on all sun-exposed parts of the skin when outside between the hours of 10 am and 4 pm (not just when at beach or pool, but even with exercise, golf, tennis, and yard work!)  Use a sunscreen that says "broad spectrum" so it covers both UVA and UVB rays, and make sure to reapply every 1-2 hours.  Remember to change the batteries in your smoke detectors when changing your clock times in the spring and fall. Carbon monoxide detectors are recommended for your home.  Use your seat belt every time you are in a car, and please drive safely and not be distracted with cell phones and texting while driving.  I recommend getting the new shingles vaccine (Shingrix). You will need to check with your insurance to see if it  is covered, and if covered, schedule a nurse visit at our office when convenient.  It is a series of 2 injections, spaced 2 months apart.  I recommend that you start some core strengthening exercises (ie yoga), as well as a short stretching routine after your walks.   Core Strength Exercises  Core exercises help to build strength in the muscles between your ribs and your hips (abdominal muscles). These muscles help to support your body and keep your spine stable. It is important to maintain strength in your core to prevent injury and pain. Some activities, such as yoga and Pilates, can help to strengthen core muscles. You can also strengthen core muscles with exercises at home. It is important to talk to your health care provider before you start a new exercise routine. What are the benefits of core strength exercises? Core strength exercises can:  Reduce back pain.  Help to rebuild strength after a back or spine injury.  Help to prevent injury during physical activity, especially injuries to the back and knees. How to do core strength exercises Repeat these exercises 10-15 times, or until you are tired. Do exercises exactly as told by your health care provider and adjust them as directed. It is normal to feel mild stretching, pulling, tightness, or discomfort as you do these exercises. If you feel any pain while doing these exercises, stop. If your pain continues or gets worse when doing core exercises, contact  your health care provider. You may want to use a padded yoga or exercise mat for strength exercises that are done on the floor. Bridging  1. Lie on your back on a firm surface with your knees bent and your feet flat on the floor. 2. Raise your hips so that your knees, hips, and shoulders form a straight line together. Keep your abdominal muscles tight. 3. Hold this position for 3-5 seconds. 4. Slowly lower your hips to the starting position. 5. Let your muscles relax completely  between repetitions. Single-leg bridge 1. Lie on your back on a firm surface with your knees bent and your feet flat on the floor. 2. Raise your hips so that your knees, hips, and shoulders form a straight line together. Keep your abdominal muscles tight. 3. Lift one foot off the floor, then completely straighten that leg. 4. Hold this position for 3-5 seconds. 5. Put the straight leg back down in the bent position. 6. Slowly lower your hips to the starting position. 7. Repeat these steps using your other leg. Side bridge 1. Lie on your side with your knees bent. Prop yourself up on the elbow that is near the floor. 2. Using your abdominal muscles and your elbow that is on the floor, raise your body off the floor. Raise your hip so that your shoulder, hip, and foot form a straight line together. 3. Hold this position for 10 seconds. Keep your head and neck raised and away from your shoulder (in their normal, neutral position). Keep your abdominal muscles tight. 4. Slowly lower your hip to the starting position. 5. Repeat and try to hold this position longer, working your way up to 30 seconds. Abdominal crunch 1. Lie on your back on a firm surface. Bend your knees and keep your feet flat on the floor. 2. Cross your arms over your chest. 3. Without bending your neck, tip your chin slightly toward your chest. 4. Tighten your abdominal muscles as you lift your chest just high enough to lift your shoulder blades off of the floor. Do not hold your breath. You can do this with short lifts or long lifts. 5. Slowly return to the starting position. Bird dog 1. Get on your hands and knees, with your legs shoulder-width apart and your arms under your shoulders. Keep your back straight. 2. Tighten your abdominal muscles. 3. Raise one of your legs off the floor and straighten it. Try to keep it parallel to the floor. 4. Slowly lower your leg to the starting position. 5. Raise one of your arms off the  floor and straighten it. Try to keep it parallel to the floor. 6. Slowly lower your arm to the starting position. 7. Repeat with the other arm and leg. If possible, try raising a leg and arm at the same time, on opposite sides of the body. For example, raise your left hand and your right leg. Plank 1. Lie on your belly. 2. Prop up your body onto your forearms and your feet, keeping your legs straight. Your body should make a straight line between your shoulders and feet. 3. Hold this position for 10 seconds while keeping your abdominal muscles tight. 4. Lower your body to the starting position. 5. Repeat and try to hold this position longer, working your way up to 30 seconds. Cross-core strengthening 1. Stand with your feet shoulder-width apart. 2. Hold a ball out in front of you. Keep your arms straight. 3. Tighten your abdominal muscles and slowly rotate  at your waist from side to side. Keep your feet flat. 4. Once you are comfortable, try repeating this exercise with a heavier ball. Top core strengthening 1. Stand about 18 inches (46 cm) in front of a wall, with your back to the wall. 2. Keep your feet flat and shoulder-width apart. 3. Tighten your abdominal muscles. 4. Bend your hips and knees. 5. Slowly reach between your legs to touch the wall behind you. 6. Slowly stand back up. 7. Raise your arms over your head and reach behind you. 8. Return to the starting position. General tips  Do not do any exercises that cause pain. If you have pain while exercising, talk to your health care provider.  Always stretch before and after doing these exercises. This can help prevent injury.  Maintain a healthy weight. Ask your health care provider what weight is healthy for you. Contact a health care provider if:  You have back pain that gets worse or does not go away.  You feel pain while doing core strength exercises. Get help right away if:  You have severe pain that does not get  better with medicine. Summary  Core exercises help to build strength in the muscles between your ribs and your waist.  Core muscles help to support your body and keep your spine stable.  Some activities, such as yoga and Pilates, can help to strengthen core muscles.  Core strength exercises can help back pain and can prevent injury.  If you feel any pain while doing core strength exercises, stop. This information is not intended to replace advice given to you by your health care provider. Make sure you discuss any questions you have with your health care provider. Document Revised: 08/02/2018 Document Reviewed: 09/01/2016 Elsevier Patient Education  2020 [employer].

## 2019-12-03 ENCOUNTER — Other Ambulatory Visit: Payer: Self-pay

## 2019-12-03 ENCOUNTER — Ambulatory Visit: Payer: BC Managed Care – PPO | Admitting: Family Medicine

## 2019-12-03 ENCOUNTER — Encounter: Payer: Self-pay | Admitting: Family Medicine

## 2019-12-03 VITALS — BP 110/70 | HR 60 | Ht 65.5 in | Wt 227.2 lb

## 2019-12-03 DIAGNOSIS — M545 Low back pain, unspecified: Secondary | ICD-10-CM

## 2019-12-03 DIAGNOSIS — E7849 Other hyperlipidemia: Secondary | ICD-10-CM

## 2019-12-03 DIAGNOSIS — Z0000 Encounter for general adult medical examination without abnormal findings: Secondary | ICD-10-CM | POA: Diagnosis not present

## 2019-12-03 DIAGNOSIS — E559 Vitamin D deficiency, unspecified: Secondary | ICD-10-CM | POA: Diagnosis not present

## 2019-12-03 DIAGNOSIS — G43909 Migraine, unspecified, not intractable, without status migrainosus: Secondary | ICD-10-CM

## 2019-12-03 DIAGNOSIS — E8881 Metabolic syndrome: Secondary | ICD-10-CM

## 2019-12-03 DIAGNOSIS — Z780 Asymptomatic menopausal state: Secondary | ICD-10-CM

## 2019-12-03 LAB — POCT URINALYSIS DIP (PROADVANTAGE DEVICE)
Bilirubin, UA: NEGATIVE
Glucose, UA: NEGATIVE mg/dL
Ketones, POC UA: NEGATIVE mg/dL
Leukocytes, UA: NEGATIVE
Nitrite, UA: NEGATIVE
Specific Gravity, Urine: 1.03
Urobilinogen, Ur: NEGATIVE
pH, UA: 5 (ref 5.0–8.0)

## 2019-12-03 MED ORDER — SUMATRIPTAN SUCCINATE 100 MG PO TABS: ORAL_TABLET | ORAL | 0 refills | Status: DC

## 2019-12-03 MED ORDER — MELOXICAM 15 MG PO TABS: 7.5000 mg | ORAL_TABLET | Freq: Every day | ORAL | 0 refills | Status: DC | PRN

## 2019-12-05 ENCOUNTER — Ambulatory Visit (INDEPENDENT_AMBULATORY_CARE_PROVIDER_SITE_OTHER): Payer: BC Managed Care – PPO | Admitting: Family Medicine

## 2019-12-06 ENCOUNTER — Encounter: Payer: Self-pay | Admitting: Family Medicine

## 2019-12-06 DIAGNOSIS — M545 Low back pain, unspecified: Secondary | ICD-10-CM

## 2019-12-06 DIAGNOSIS — R319 Hematuria, unspecified: Secondary | ICD-10-CM

## 2019-12-17 ENCOUNTER — Other Ambulatory Visit: Payer: Self-pay | Admitting: Family Medicine

## 2019-12-19 ENCOUNTER — Ambulatory Visit
Admission: RE | Admit: 2019-12-19 | Discharge: 2019-12-19 | Disposition: A | Discharge status: Home / Self Care | Payer: BC Managed Care – PPO | Source: Ambulatory Visit | Attending: Family Medicine | Admitting: Family Medicine

## 2019-12-19 DIAGNOSIS — M545 Low back pain, unspecified: Secondary | ICD-10-CM

## 2019-12-19 DIAGNOSIS — R319 Hematuria, unspecified: Secondary | ICD-10-CM

## 2019-12-19 MED ORDER — IOPAMIDOL (ISOVUE-300) INJECTION 61%
100.0000 mL | Freq: Once | INTRAVENOUS | Status: AC | PRN
  Administered 2019-12-19: 100 mL via INTRAVENOUS

## 2019-12-28 ENCOUNTER — Other Ambulatory Visit: Payer: Self-pay | Admitting: Family Medicine

## 2019-12-28 DIAGNOSIS — G43909 Migraine, unspecified, not intractable, without status migrainosus: Secondary | ICD-10-CM

## 2019-12-28 NOTE — Telephone Encounter (Signed)
Pt does not need this refilled.

## 2020-01-02 ENCOUNTER — Encounter: Payer: Self-pay | Admitting: Family Medicine

## 2020-04-21 ENCOUNTER — Encounter: Payer: Self-pay | Admitting: Family Medicine

## 2020-08-26 HISTORY — PX: FACET JOINT INJECTION: SHX5016

## 2020-09-03 ENCOUNTER — Other Ambulatory Visit: Payer: Self-pay | Admitting: Family Medicine

## 2020-09-03 DIAGNOSIS — Z1231 Encounter for screening mammogram for malignant neoplasm of breast: Secondary | ICD-10-CM

## 2020-11-04 ENCOUNTER — Other Ambulatory Visit: Payer: Self-pay

## 2020-11-04 ENCOUNTER — Ambulatory Visit
Admission: RE | Admit: 2020-11-04 | Discharge: 2020-11-04 | Disposition: A | Discharge status: Home / Self Care | Payer: BC Managed Care – PPO | Source: Ambulatory Visit | Attending: Family Medicine | Admitting: Family Medicine

## 2020-11-04 DIAGNOSIS — Z1231 Encounter for screening mammogram for malignant neoplasm of breast: Secondary | ICD-10-CM

## 2020-11-07 ENCOUNTER — Other Ambulatory Visit: Payer: Self-pay | Admitting: Family Medicine

## 2020-11-07 DIAGNOSIS — R928 Other abnormal and inconclusive findings on diagnostic imaging of breast: Secondary | ICD-10-CM

## 2020-11-12 NOTE — Progress Notes (Deleted)
[facility] [address] [place] [address] Phone: [phone] Subjective:    I'm seeing this patient by the request  of:  Joselyn Arrow, MD  CC:   GYI:[id number]  Anita Taylor is a 55 y.o. female coming in with complaint of back pain. Patient states   Onset-  Location Duration-  Character- Aggravating factors- Reliving factors-  Therapies tried-  Severity-     Past Medical History:  Diagnosis Date   Back pain    side/hip   Constipation    Migraine, menstrual    Vitamin D deficiency    Past Surgical History:  Procedure Laterality Date   CESAREAN SECTION     x1   ENDOMETRIAL ABLATION  08   KELOID EXCISION     ears   TUBAL LIGATION     Social History   Socioeconomic History   Marital status: Married    Spouse name: Elsie Saas" Mumme   Number of children: 1   Years of education: Not on file   Highest education level: Not on file  Occupational History   Occupation: pre-K Psychologist, occupational for [employer]    Employer: [employer]  Tobacco Use   Smoking status: Never   Smokeless tobacco: Never  Vaping Use   Vaping Use: Never used  Substance and Sexual Activity   Alcohol use: Yes    Alcohol/week: 1.0 standard drink    Types: 1 Glasses of wine per week    Comment: a glass of wine every 1 - 2 months.   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 No smoke exposure   Caffeine: tea and 1-2 diet sodas per week   (updated 11/2019)   Social Determinants of Health   Financial Resource Strain: Not on file  Food Insecurity: Not on file  Transportation Needs: Not on file  Physical Activity: Not on file  Stress: Not on file  Social Connections:  Not on file   No Known Allergies Family History  Problem Relation Age of Onset   Hypertension Father    Hyperlipidemia Father    Obesity Father    Migraines Sister    Hypertension Mother    Obesity Mother    Diabetes Maternal Grandmother    Cancer Maternal Grandfather        prostate cancer (65)   Colon cancer Neg Hx    Esophageal cancer Neg Hx    Colon polyps Neg Hx    Rectal cancer Neg Hx    Stomach cancer Neg Hx     Current Outpatient Medications (Endocrine & Metabolic):    metFORMIN (GLUCOPHAGE) 500 MG tablet, Take 1 tablet (500 mg total) by mouth daily with breakfast.    Current Outpatient Medications (Analgesics):    meloxicam (MOBIC) 15 MG tablet, Take 0.5-1 tablets (7.5-15 mg total) by mouth daily as needed for pain. Take with food   SUMAtriptan (IMITREX) 100 MG tablet, May repeat in 2 hours if headache persists or recurs.   Current Outpatient Medications (Other):    cholecalciferol (VITAMIN D3) 25 MCG (1000 UNIT) tablet, Take 1,000 Units by mouth daily.   Multiple Vitamins-Minerals (MULTIVITAMIN WITH MINERALS) tablet, Take 1 tablet by mouth  daily.   Vitamin D, Ergocalciferol, (DRISDOL) 1.25 MG (50000 UNIT) CAPS capsule, Take 1 capsule (50,000 Units total) by mouth every 7 (seven) days.   Reviewed prior external information including notes and imaging from  primary care provider As well as notes that were available from care everywhere and other healthcare systems.  Past medical history, social, surgical and family history all reviewed in electronic medical record.  No pertanent information unless stated regarding to the chief complaint.   Review of Systems:  No headache, visual changes, nausea, vomiting, diarrhea, constipation, dizziness, abdominal pain, skin rash, fevers, chills, night sweats, weight loss, swollen lymph nodes, body aches, joint swelling, chest pain, shortness of breath, mood changes. POSITIVE muscle aches  Objective  There were no vitals taken  for this visit.   General: No apparent distress alert and oriented x3 mood and affect normal, dressed appropriately.  HEENT: Pupils equal, extraocular movements intact  Respiratory: Patient's speak in full sentences and does not appear short of breath  Cardiovascular: No lower extremity edema, non tender, no erythema  Gait normal with good balance and coordination.  MSK:  Non tender with full range of motion and good stability and symmetric strength and tone of shoulders, elbows, wrist, hip, knee and ankles bilaterally.     Impression and Recommendations:     The above documentation has been reviewed and is accurate and complete Wilford Grist

## 2020-11-17 ENCOUNTER — Ambulatory Visit: Payer: BC Managed Care – PPO | Admitting: Family Medicine

## 2020-11-18 ENCOUNTER — Encounter: Payer: Self-pay | Admitting: Family Medicine

## 2020-11-18 ENCOUNTER — Telehealth: Payer: BC Managed Care – PPO | Admitting: Family Medicine

## 2020-11-18 VITALS — Temp 99.6°F | Wt 210.0 lb

## 2020-11-18 DIAGNOSIS — R519 Headache, unspecified: Secondary | ICD-10-CM

## 2020-11-18 DIAGNOSIS — Z20822 Contact with and (suspected) exposure to covid-19: Secondary | ICD-10-CM | POA: Diagnosis not present

## 2020-11-18 NOTE — Progress Notes (Signed)
   Subjective:    Patient ID: Anita Taylor, female    DOB: 1965-06-27, 55 y.o.   MRN: [id number]  HPI Documentation for virtual audio and video telecommunications through Caregility encounter: The patient was located at home. 2 patient identifiers used.  The provider was located in the office. The patient did consent to this visit and is aware of possible charges through their insurance for this visit. The other persons participating in this telemedicine service were none. Time spent on call was 5 minutes and in review of previous records >20 minutes total for counseling and coordination of care. This virtual service is not related to other E/M service within previous 7 days.  She states that her husband had a positive COVID test on Saturday.  She got a COVID booster shot on that day as well.  Sunday she developed a slight cough and yesterday had headache and myalgias with a slight cough.  She tested today and was negative.  Review of Systems     Objective:   Physical Exam Alert and in no distress otherwise not examined       Assessment & Plan:  Close exposure to COVID-19 virus  Nonintractable headache, unspecified chronicity pattern, unspecified headache type I explained that at this time she probably does not have COVID.  I then explained symptomatic care to her however if she worsens in terms of cough, fever, shortness of breath, follow-up COVID test is not unreasonable.  She expressed understanding of this.

## 2020-11-24 ENCOUNTER — Other Ambulatory Visit: Payer: BC Managed Care – PPO

## 2020-12-02 NOTE — Progress Notes (Signed)
Chief Complaint  Patient presents with   Annual Exam    Fasting annual exam with pelvic. Sees [facility] @ [place], went Jan 2022. Insurance does cover shingrix, she would like to come on a Friday for a NV due to potential side effects. No new concerns.     Anita Taylor is a 55 y.o. female who presents for a complete physical.    She recently had COVID (contracted just when she got the booster), and has fully recovered.  She was last seen by me at her physical a year ago. At that time she was having pain in her back/hip. She was being seen by Delbert Harness. She states she had MRI, reportedly showing DDD and bulging disks. She reports she had 1 injection (possibly SI joint? (Based on location of pain), vs epidural?), possibly in May, only got relief for 1 day.  PT made it worse.  Notes reviewed--Sept/Oct/March.  Those reference the MRI results, and that she had courses of prednisone, and injection for trochanteric bursitis.  Subsequent injection must have been after this, no records received..  Hematuria:  microscopic, noted for many years.  Had CT 11/2019 of abdomen and pelvis, due to her L flank pain that she was having at the time, and hematuria.  This was normal.  Has never seen urologist.  Obesity:  She [facility]'t [facility] in the last year.  She was prescribed metformin for insulin resistance, but admits she only took it for a few days--preferred not to take it, and maybe bothered her stomach.  She has continued to follow the recommended diet, and has continued to lose weight. Weight was 256# at physical in 10/2017, 235# 3.2 oz at physical in 10/2018, she was 224# last year.  Lab Results  Component Value Date   HGBA1C 5.6 09/06/2019   Migraines:   She uses sumatriptan prn, last filled #9 11/2019.  This remains effective.  She uses Excedrin if taken early in the day (otherwise causes insomnia).  Migraines occur about every 3 months, imitrex  helps, and no longer gets cluster where they last for 3 days.  Needs refill. (Saw neuro in 11/2016 when having more frequent migraines, didn't tolerate the prescribed ER propranolol. Thinks some of the headaches were hormonal; headaches better since postmenopausal. She never had the MRI or sleep study as recommended by Dr. Lucia Gaskins).  Postmenopausal--hasn't had a period in over 3 years and elevated [facility] 10/2017. She is having some hot flashes and night sweats, worse with caffeine or certain foods/sweets/wine. Remain tolerable, mainly at night.  Vitamin D deficiency:  This was monitored and treated by [facility].   Last level was 33.9 in 08/2019. It was 23.5 in 10/2018. She is currently taking 1000 IU every other day (thought daily dosing contributed to constipation), along with daily MVI.  H/o borderline hyperlipidemia:  Improved in the past with dietary changes. Follows lowfat, low cholesterol diet. Doesn't eat much red meat, cheese, eggs.  Lab Results  Component Value Date   CHOL 190 09/06/2019   HDL 63 09/06/2019   LDLCALC 117 (H) 09/06/2019   TRIG 52 09/06/2019   CHOLHDL 3.4 11/23/2018    Immunization History  Administered Date(s) Administered   Influenza Split 12/29/2011   Influenza,inj,Quad PF,6+ Mos 01/28/2016, 01/30/2017, 01/31/2019   Influenza-Unspecified 02/02/2017, 02/04/2018   PFIZER Comirnaty(Gray Top)Covid-19 Tri-Sucrose Vaccine 11/15/2020   PFIZER(Purple Top)SARS-COV-2 Vaccination 06/23/2019, 07/14/2019   PPD Test 01/19/2012   Tdap 01/19/2012  Got flu shot at [facility] Last Pap smear: 10/2017--normal, no high risk HPV present Last mammogram:  10/2020--possible mass in L breast.  Scheduled for additional imaging on 12/08/20 Last colonoscopy: 11/2018, small internal hemorrhoid. Recheck 10 years Last DEXA: never  Dentist: twice yearly Ophtho: yearly Exercise: 40 minutes 4-5x/week "walk away the pounds" tape. Not getting much weight-bearing exercise at this time.   PMH, PSH, SH and FH  reviewed and updated  Outpatient Encounter Medications as of 12/03/2020  Medication Sig Note   Ascorbic Acid (VITAMIN C ADULT GUMMIES PO) Take by mouth. 12/03/2020: Takes 2 gummies daily   cholecalciferol (VITAMIN D3) 25 MCG (1000 UNIT) tablet Take 1,000 Units by mouth daily. 12/03/2020: Taking every other day   ELDERBERRY PO Take by mouth. 12/03/2020: Takes 2 gummies daily   Multiple Vitamins-Minerals (MULTIVITAMIN WITH MINERALS) tablet Take 1 tablet by mouth daily.    SUMAtriptan (IMITREX) 100 MG tablet May repeat in 2 hours if headache persists or recurs. (Patient not taking: Reported on 12/03/2020)    [DISCONTINUED] meloxicam (MOBIC) 15 MG tablet Take 0.5-1 tablets (7.5-15 mg total) by mouth daily as needed for pain. Take with food    [DISCONTINUED] metFORMIN (GLUCOPHAGE) 500 MG tablet Take 1 tablet (500 mg total) by mouth daily with breakfast. (Patient not taking: Reported on 11/18/2020)    [DISCONTINUED] Vitamin D, Ergocalciferol, (DRISDOL) 1.25 MG (50000 UNIT) CAPS capsule Take 1 capsule (50,000 Units total) by mouth every 7 (seven) days.    No facility-administered encounter medications on file as of 12/03/2020.   Also takes magnesium supplement every other day, to help with constipation.  No Known Allergies   ROS:  The patient denies anorexia, fever, decreased hearing, ear pain, sore throat, breast concerns, chest pain, palpitations, dizziness, syncope, dyspnea on exertion, cough, swelling, nausea, vomiting, diarrhea, constipation, abdominal pain, melena, hematochezia, indigestion/heartburn, hematuria, incontinence, dysuria, vaginal bleeding, discharge, odor or itch, genital lesions, joint pains, numbness, tingling, weakness, tremor, suspicious skin lesions, depression, anxiety, abnormal bleeding/bruising, or enlarged lymph nodes. She has noted some hair thinning (thinks hereditary vs menopause, thinks some of the medications she has taken for her hip might also have contributed).   Constipation--controlled with magnesium supplement every other day Migraine headaches are infrequent Hot flashes, night sweats, tolerable L-sided low back/hip pain persists per HPI.   PHYSICAL EXAM:  BP 118/66   Pulse 72   Ht 5' 5.5" (1.664 m)   Wt 209 lb (94.8 kg)   BMI 34.25 kg/m   Wt Readings from Last 3 Encounters:  12/03/20 209 lb (94.8 kg)  11/18/20 210 lb (95.3 kg)  12/03/19 227 lb 3.2 oz (103.1 kg)    General Appearance:      Alert, cooperative, no distress, appears stated age    Head:      Normocephalic, without obvious abnormality, atraumatic    Eyes:      PERRL, conjunctiva/corneas clear, EOM's intact, fundi benign    Ears:      Normal TM's and external ear canals    Nose:     Not examined (wearing mask due to COVID-19 pandemic)   Throat:     Not examined (wearing mask due to COVID-19 pandemic)  Neck:     Supple, no lymphadenopathy;  thyroid: no enlargement/tenderness/nodules; no carotid bruit or JVD    Back:      Spine nontender, no curvature, ROM normal, no CVA tenderness. Mildly tender over L SI joint and gluteal muscles. Some discomfort with pyriformis stretch, but FROM  Lungs:  Clear to auscultation bilaterally without wheezes, rales or ronchi; respirations unlabored    Chest Wall:      No tenderness or deformity     Heart:      Regular rate and rhythm, S1 and S2 normal, no murmur, rub or gallop    Breast Exam:      No tenderness, masses, or nipple discharge or inversion.  No axillary lymphadenopathy    Abdomen:       Soft, non-tender, nondistended, normoactive bowel sounds,     no masses, no hepatosplenomegaly    Genitalia:      Normal external genitalia without lesions.  BUS and vagina normal; no cervical motion tenderness. No abnormal vaginal discharge.  Uterus and adnexa not enlarged, no masses or tenderness.  Pap not performed    Rectal:      Normal tone, no masses or tenderness; guaiac negative stool    Extremities:     No clubbing, cyanosis or edema     Pulses:     2+ and symmetric all extremities    Skin:     Skin color, texture, turgor normal, no rashes or lesions    Lymph nodes:     Cervical, supraclavicular, and axillary nodes normal    Neurologic:     Normal strength, sensation and gait; reflexes 2+ and symmetric throughout               Psych:   Normal mood, affect, hygiene and grooming   ASSESSMENT/PLAN:  Annual physical exam - Plan: POCT Urinalysis DIP (Proadvantage Device), Lipid panel, Comprehensive metabolic panel, CBC with Differential/Platelet, VITAMIN D 25 Hydroxy (Vit-D Deficiency, Fractures), TSH, Hemoglobin A1c, Hepatitis C antibody  Vitamin D deficiency - check level to ensure adequacy of current supplements - Plan: VITAMIN D 25 Hydroxy (Vit-D Deficiency, Fractures)  Postmenopausal - minimal symptoms (hair thinning, some hot flashes at night, tolerable)  Insulin resistance - Congratulated on weight loss so far, to continue to work on losing - Plan: Hemoglobin A1c  Constipation, unspecified constipation type - counseled re: fluid intake, high fiber diet. okay to use Mg qod as she has been to control sx - Plan: TSH  Hair thinning - suspect related to menopause, but will check thyroid since worse in the last year - Plan: TSH  Need for hepatitis C screening test - Plan: Hepatitis C antibody  BMI 34.0-34.9,adult - Plan: Hemoglobin A1c  Microscopic hematuria - ongoing for at least 4-5 years.  Normal CT last year.  Refer to urology for further eval/cystoscopy - Plan: Ambulatory referral to Urology  Migraine without status migrainosus, not intractable, unspecified migraine type - infrequent, responds well to imitrex - Plan: SUMAtriptan (IMITREX) 100 MG tablet  Left-sided low back pain without sciatica, unspecified chronicity - suspect related to SI joint, but also has known DDD, disc bulging.  Trial of chiro.  Will request last notes from [facility]  ROR Georga Hacking notes from last visit, and injection.  Consider chiro  eval, given SI involvement as well.  Discussed monthly self breast exams and yearly mammograms; at least 30 minutes of aerobic activity at least 5 days/week, weight-bearing exercise 2-3x/wk; proper sunscreen use reviewed; healthy diet, including goals of calcium and vitamin D intake and alcohol recommendations (less than or equal to 1 drink/day) reviewed; regular seatbelt use; changing batteries in smoke detectors.  Immunization recommendations discussed--flu shots recommended yearly, Shingrix recommended, risks/side effects reviewed (she will schedule NV when convenient). Tetanus due next year. Colonoscopy recommendations reviewed, UTD. Pap due in  2024.   

## 2020-12-02 NOTE — Patient Instructions (Addendum)
  HEALTH MAINTENANCE RECOMMENDATIONS:  It is recommended that you get at least 30 minutes of aerobic exercise at least 5 days/week (for weight loss, you may need as much as 60-90 minutes). This can be any activity that gets your heart rate up. This can be divided in 10-15 minute intervals if needed, but try and build up your endurance at least once a week.  Weight bearing exercise is also recommended twice weekly.  Eat a healthy diet with lots of vegetables, fruits and fiber.  "Colorful" foods have a lot of vitamins (ie green vegetables, tomatoes, red peppers, etc).  Limit sweet tea, regular sodas and alcoholic beverages, all of which has a lot of calories and sugar.  Up to 1 alcoholic drink daily may be beneficial for women (unless trying to lose weight, watch sugars).  Drink a lot of water.  Calcium recommendations are 1200-1500 mg daily (1500 mg for postmenopausal women or women without ovaries), and vitamin D 1000 IU daily.  This should be obtained from diet and/or supplements (vitamins), and calcium should not be taken all at once, but in divided doses.  Monthly self breast exams and yearly mammograms for women over the age of 46 is recommended.  Sunscreen of at least SPF 30 should be used on all sun-exposed parts of the skin when outside between the hours of 10 am and 4 pm (not just when at beach or pool, but even with exercise, golf, tennis, and yard work!)  Use a sunscreen that says "broad spectrum" so it covers both UVA and UVB rays, and make sure to reapply every 1-2 hours.  Remember to change the batteries in your smoke detectors when changing your clock times in the spring and fall. Carbon monoxide detectors are recommended for your home.  Use your seat belt every time you are in a car, and please drive safely and not be distracted with cell phones and texting while driving.  I recommend getting the new shingles vaccine (Shingrix). This is a series of 2 injections, spaced 2 months apart.   It doesn't have to be exactly 2 months apart (but can't be under 2 months), if that isn't feasible for your schedule, but try and get them close to 2 months (and definitely within 6 months of each other, or else the efficacy of the vaccine drops off).  We will have you schedule a nurse visit for this when convenient for you (on a Friday).  We will request records from [facility] so I can see exactly what's been going on.  (We didn't get notes from your last visit, just up through March 2022).  Consider seeing a chiropractor--this can often be helpful for SI joint problems, as well as other back pain (since you didn't tolerate physical therapy).  I suggest [facility]ulatory affairs [facility] Ch[facility], Dr. Vear Clock or Clydie Braun) or [facility]iro (Dr. Lambert Mody)

## 2020-12-03 ENCOUNTER — Ambulatory Visit (INDEPENDENT_AMBULATORY_CARE_PROVIDER_SITE_OTHER): Payer: BC Managed Care – PPO | Admitting: Family Medicine

## 2020-12-03 ENCOUNTER — Other Ambulatory Visit: Payer: Self-pay

## 2020-12-03 ENCOUNTER — Encounter: Payer: Self-pay | Admitting: Family Medicine

## 2020-12-03 VITALS — BP 118/66 | HR 72 | Ht 65.5 in | Wt 209.0 lb

## 2020-12-03 DIAGNOSIS — Z1159 Encounter for screening for other viral diseases: Secondary | ICD-10-CM

## 2020-12-03 DIAGNOSIS — Z6834 Body mass index (BMI) 34.0-34.9, adult: Secondary | ICD-10-CM | POA: Diagnosis not present

## 2020-12-03 DIAGNOSIS — E559 Vitamin D deficiency, unspecified: Secondary | ICD-10-CM | POA: Diagnosis not present

## 2020-12-03 DIAGNOSIS — R3129 Other microscopic hematuria: Secondary | ICD-10-CM

## 2020-12-03 DIAGNOSIS — G43909 Migraine, unspecified, not intractable, without status migrainosus: Secondary | ICD-10-CM

## 2020-12-03 DIAGNOSIS — Z0000 Encounter for general adult medical examination without abnormal findings: Secondary | ICD-10-CM

## 2020-12-03 DIAGNOSIS — E8881 Metabolic syndrome: Secondary | ICD-10-CM | POA: Diagnosis not present

## 2020-12-03 DIAGNOSIS — L659 Nonscarring hair loss, unspecified: Secondary | ICD-10-CM

## 2020-12-03 DIAGNOSIS — K5900 Constipation, unspecified: Secondary | ICD-10-CM

## 2020-12-03 DIAGNOSIS — Z780 Asymptomatic menopausal state: Secondary | ICD-10-CM

## 2020-12-03 DIAGNOSIS — M545 Low back pain, unspecified: Secondary | ICD-10-CM

## 2020-12-03 LAB — POCT URINALYSIS DIP (PROADVANTAGE DEVICE)
Glucose, UA: NEGATIVE mg/dL
Leukocytes, UA: NEGATIVE
Nitrite, UA: NEGATIVE
Specific Gravity, Urine: 1.03
Urobilinogen, Ur: NEGATIVE
pH, UA: 5.5 (ref 5.0–8.0)

## 2020-12-03 MED ORDER — SUMATRIPTAN SUCCINATE 100 MG PO TABS: ORAL_TABLET | ORAL | 0 refills | Status: DC

## 2020-12-04 LAB — CBC WITH DIFFERENTIAL/PLATELET
Basophils Absolute: 0 10*3/uL (ref 0.0–0.2)
Basos: 1 %
EOS (ABSOLUTE): 0.1 10*3/uL (ref 0.0–0.4)
Eos: 1 %
Hematocrit: 39.2 % (ref 34.0–46.6)
Hemoglobin: 12.6 g/dL (ref 11.1–15.9)
Immature Grans (Abs): 0 10*3/uL (ref 0.0–0.1)
Immature Granulocytes: 0 %
Lymphocytes Absolute: 1.5 10*3/uL (ref 0.7–3.1)
Lymphs: 26 %
MCH: 30.1 pg (ref 26.6–33.0)
MCHC: 32.1 g/dL (ref 31.5–35.7)
MCV: 94 fL (ref 79–97)
Monocytes Absolute: 0.5 10*3/uL (ref 0.1–0.9)
Monocytes: 8 %
Neutrophils Absolute: 3.7 10*3/uL (ref 1.4–7.0)
Neutrophils: 64 %
Platelets: 300 10*3/uL (ref 150–450)
RBC: 4.18 x10E6/uL (ref 3.77–5.28)
RDW: 12.6 % (ref 11.7–15.4)
WBC: 5.7 10*3/uL (ref 3.4–10.8)

## 2020-12-04 LAB — COMPREHENSIVE METABOLIC PANEL
ALT: 10 IU/L (ref 0–32)
AST: 19 IU/L (ref 0–40)
Albumin/Globulin Ratio: 1.7 (ref 1.2–2.2)
Albumin: 4.5 g/dL (ref 3.8–4.9)
Alkaline Phosphatase: 72 IU/L (ref 44–121)
BUN/Creatinine Ratio: 16 (ref 9–23)
BUN: 12 mg/dL (ref 6–24)
Bilirubin Total: 0.5 mg/dL (ref 0.0–1.2)
CO2: 22 mmol/L (ref 20–29)
Calcium: 9.8 mg/dL (ref 8.7–10.2)
Chloride: 105 mmol/L (ref 96–106)
Creatinine, Ser: 0.74 mg/dL (ref 0.57–1.00)
Globulin, Total: 2.7 g/dL (ref 1.5–4.5)
Glucose: 84 mg/dL (ref 65–99)
Potassium: 4.5 mmol/L (ref 3.5–5.2)
Sodium: 144 mmol/L (ref 134–144)
Total Protein: 7.2 g/dL (ref 6.0–8.5)
eGFR: 97 mL/min/{1.73_m2} (ref >59)

## 2020-12-04 LAB — LIPID PANEL
Chol/HDL Ratio: 3.4 ratio (ref 0.0–4.4)
Cholesterol, Total: 211 mg/dL — ABNORMAL HIGH (ref 100–199)
HDL: 62 mg/dL (ref >39)
LDL Chol Calc (NIH): 134 mg/dL — ABNORMAL HIGH (ref 0–99)
Triglycerides: 83 mg/dL (ref 0–149)
VLDL Cholesterol Cal: 15 mg/dL (ref 5–40)

## 2020-12-04 LAB — HEMOGLOBIN A1C
Est. average glucose Bld gHb Est-mCnc: 120 mg/dL
Hgb A1c MFr Bld: 5.8 % — ABNORMAL HIGH (ref 4.8–5.6)

## 2020-12-04 LAB — TSH: TSH: 1.21 u[IU]/mL (ref 0.450–4.500)

## 2020-12-04 LAB — HEPATITIS C ANTIBODY: Hep C Virus Ab: <0.1 s/co ratio (ref 0.0–0.9)

## 2020-12-04 LAB — VITAMIN D 25 HYDROXY (VIT D DEFICIENCY, FRACTURES): Vit D, 25-Hydroxy: 43.3 ng/mL (ref 30.0–100.0)

## 2020-12-08 ENCOUNTER — Other Ambulatory Visit: Payer: Self-pay

## 2020-12-08 ENCOUNTER — Ambulatory Visit
Admission: RE | Admit: 2020-12-08 | Discharge: 2020-12-08 | Disposition: A | Discharge status: Home / Self Care | Payer: BC Managed Care – PPO | Source: Ambulatory Visit | Attending: Family Medicine | Admitting: Family Medicine

## 2020-12-08 DIAGNOSIS — R928 Other abnormal and inconclusive findings on diagnostic imaging of breast: Secondary | ICD-10-CM

## 2020-12-12 ENCOUNTER — Other Ambulatory Visit (INDEPENDENT_AMBULATORY_CARE_PROVIDER_SITE_OTHER): Payer: BC Managed Care – PPO

## 2020-12-12 ENCOUNTER — Other Ambulatory Visit: Payer: Self-pay

## 2020-12-12 DIAGNOSIS — Z23 Encounter for immunization: Secondary | ICD-10-CM

## 2020-12-22 ENCOUNTER — Encounter: Payer: Self-pay | Admitting: *Deleted

## 2020-12-22 ENCOUNTER — Telehealth: Payer: Self-pay | Admitting: Family Medicine

## 2020-12-22 NOTE — Telephone Encounter (Signed)
Requested records received from [facility]

## 2021-02-12 ENCOUNTER — Other Ambulatory Visit: Payer: Self-pay | Admitting: Family Medicine

## 2021-02-12 ENCOUNTER — Other Ambulatory Visit: Payer: BC Managed Care – PPO

## 2021-02-12 DIAGNOSIS — G43909 Migraine, unspecified, not intractable, without status migrainosus: Secondary | ICD-10-CM

## 2021-02-12 NOTE — Telephone Encounter (Signed)
Is this okay to refill? 

## 2021-02-12 NOTE — Telephone Encounter (Signed)
She has been having more frequent migraines, saw chiro today and did get some relief. She just wanted to refill to have on hand-just in case. Refill sent.

## 2021-02-12 NOTE — Telephone Encounter (Signed)
Check with patient what is going on--this is more frequent than she previously needed filed.  Ok to refill, but may need to be seen if having much more frequent migraines.

## 2021-02-13 ENCOUNTER — Other Ambulatory Visit: Payer: BC Managed Care – PPO

## 2021-02-16 ENCOUNTER — Other Ambulatory Visit (INDEPENDENT_AMBULATORY_CARE_PROVIDER_SITE_OTHER): Payer: BC Managed Care – PPO

## 2021-02-16 ENCOUNTER — Other Ambulatory Visit: Payer: Self-pay

## 2021-02-16 DIAGNOSIS — Z23 Encounter for immunization: Secondary | ICD-10-CM

## 2021-03-03 ENCOUNTER — Other Ambulatory Visit: Payer: Self-pay

## 2021-03-03 ENCOUNTER — Other Ambulatory Visit (INDEPENDENT_AMBULATORY_CARE_PROVIDER_SITE_OTHER): Payer: BC Managed Care – PPO

## 2021-03-03 DIAGNOSIS — Z23 Encounter for immunization: Secondary | ICD-10-CM | POA: Diagnosis not present

## 2021-04-01 DIAGNOSIS — Z0289 Encounter for other administrative examinations: Secondary | ICD-10-CM

## 2021-05-05 ENCOUNTER — Ambulatory Visit (INDEPENDENT_AMBULATORY_CARE_PROVIDER_SITE_OTHER): Payer: BC Managed Care – PPO | Admitting: Family Medicine

## 2021-05-05 ENCOUNTER — Encounter (INDEPENDENT_AMBULATORY_CARE_PROVIDER_SITE_OTHER): Payer: Self-pay | Admitting: Family Medicine

## 2021-05-05 ENCOUNTER — Other Ambulatory Visit: Payer: Self-pay

## 2021-05-05 VITALS — BP 126/77 | HR 66 | Temp 97.9°F | Ht 66.0 in | Wt 215.0 lb

## 2021-05-05 DIAGNOSIS — E559 Vitamin D deficiency, unspecified: Secondary | ICD-10-CM

## 2021-05-05 DIAGNOSIS — Z1331 Encounter for screening for depression: Secondary | ICD-10-CM

## 2021-05-05 DIAGNOSIS — R7303 Prediabetes: Secondary | ICD-10-CM | POA: Diagnosis not present

## 2021-05-05 DIAGNOSIS — R5383 Other fatigue: Secondary | ICD-10-CM

## 2021-05-05 DIAGNOSIS — E669 Obesity, unspecified: Secondary | ICD-10-CM

## 2021-05-05 DIAGNOSIS — Z9189 Other specified personal risk factors, not elsewhere classified: Secondary | ICD-10-CM | POA: Diagnosis not present

## 2021-05-05 DIAGNOSIS — Z6834 Body mass index (BMI) 34.0-34.9, adult: Secondary | ICD-10-CM

## 2021-05-05 DIAGNOSIS — R0602 Shortness of breath: Secondary | ICD-10-CM

## 2021-05-05 DIAGNOSIS — E7849 Other hyperlipidemia: Secondary | ICD-10-CM

## 2021-05-06 LAB — CBC WITH DIFFERENTIAL/PLATELET
Basophils Absolute: 0.1 10*3/uL (ref 0.0–0.2)
Basos: 1 %
EOS (ABSOLUTE): 0.2 10*3/uL (ref 0.0–0.4)
Eos: 3 %
Hematocrit: 37.7 % (ref 34.0–46.6)
Hemoglobin: 12.9 g/dL (ref 11.1–15.9)
Immature Grans (Abs): 0 10*3/uL (ref 0.0–0.1)
Immature Granulocytes: 0 %
Lymphocytes Absolute: 1.5 10*3/uL (ref 0.7–3.1)
Lymphs: 27 %
MCH: 31.4 pg (ref 26.6–33.0)
MCHC: 34.2 g/dL (ref 31.5–35.7)
MCV: 92 fL (ref 79–97)
Monocytes Absolute: 0.4 10*3/uL (ref 0.1–0.9)
Monocytes: 7 %
Neutrophils Absolute: 3.4 10*3/uL (ref 1.4–7.0)
Neutrophils: 62 %
Platelets: 282 10*3/uL (ref 150–450)
RBC: 4.11 x10E6/uL (ref 3.77–5.28)
RDW: 12.7 % (ref 11.7–15.4)
WBC: 5.5 10*3/uL (ref 3.4–10.8)

## 2021-05-06 LAB — FOLATE: Folate: 17.3 ng/mL (ref >3.0)

## 2021-05-06 LAB — T4, FREE: Free T4: 1.38 ng/dL (ref 0.82–1.77)

## 2021-05-06 LAB — COMPREHENSIVE METABOLIC PANEL
ALT: 16 IU/L (ref 0–32)
AST: 21 IU/L (ref 0–40)
Albumin/Globulin Ratio: 1.3 (ref 1.2–2.2)
Albumin: 4.3 g/dL (ref 3.8–4.9)
Alkaline Phosphatase: 86 IU/L (ref 44–121)
BUN/Creatinine Ratio: 21 (ref 9–23)
BUN: 16 mg/dL (ref 6–24)
Bilirubin Total: 0.3 mg/dL (ref 0.0–1.2)
CO2: 23 mmol/L (ref 20–29)
Calcium: 9.4 mg/dL (ref 8.7–10.2)
Chloride: 104 mmol/L (ref 96–106)
Creatinine, Ser: 0.76 mg/dL (ref 0.57–1.00)
Globulin, Total: 3.2 g/dL (ref 1.5–4.5)
Glucose: 81 mg/dL (ref 70–99)
Potassium: 4.6 mmol/L (ref 3.5–5.2)
Sodium: 139 mmol/L (ref 134–144)
Total Protein: 7.5 g/dL (ref 6.0–8.5)
eGFR: 94 mL/min/{1.73_m2} (ref >59)

## 2021-05-06 LAB — VITAMIN D 25 HYDROXY (VIT D DEFICIENCY, FRACTURES): Vit D, 25-Hydroxy: 49 ng/mL (ref 30.0–100.0)

## 2021-05-06 LAB — LIPID PANEL WITH LDL/HDL RATIO
Cholesterol, Total: 232 mg/dL — ABNORMAL HIGH (ref 100–199)
HDL: 68 mg/dL (ref >39)
LDL Chol Calc (NIH): 152 mg/dL — ABNORMAL HIGH (ref 0–99)
LDL/HDL Ratio: 2.2 ratio (ref 0.0–3.2)
Triglycerides: 68 mg/dL (ref 0–149)
VLDL Cholesterol Cal: 12 mg/dL (ref 5–40)

## 2021-05-06 LAB — HEMOGLOBIN A1C
Est. average glucose Bld gHb Est-mCnc: 114 mg/dL
Hgb A1c MFr Bld: 5.6 % (ref 4.8–5.6)

## 2021-05-06 LAB — INSULIN, RANDOM: INSULIN: 8.4 u[IU]/mL (ref 2.6–24.9)

## 2021-05-06 LAB — VITAMIN B12: Vitamin B-12: 446 pg/mL (ref 232–1245)

## 2021-05-06 LAB — TSH: TSH: 1.09 u[IU]/mL (ref 0.450–4.500)

## 2021-05-07 NOTE — Progress Notes (Signed)
Chief Complaint:   OBESITY Anita Taylor (MR# [id number]) is a 56 y.o. female who presents for evaluation and treatment of obesity and related comorbidities. Body mass index is 34.7 kg/m. Anita Taylor has been struggling with her weight for many years and has been unsuccessful in either losing weight, maintaining weight loss, or reaching her healthy weight goal.  Anita Taylor was here in 2021. She was a patient of Dr. Francena Hanly, and she is seen by me today for the first time. 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 She eats ou/on the road 3 days per week. She craves salty-crunchy food. She feels that snacking is her worst habit. Her husband is a diabetic and he has lost 100 lbs in the past 1 year and he is getting healthy.  Anita Taylor is currently in the action stage of change and ready to dedicate time achieving and maintaining a healthier weight. Anita Taylor is interested in becoming our patient and working on intensive lifestyle modifications including (but not limited to) diet and exercise for weight loss.  Anita Taylor's habits were reviewed today and are as follows: Her family eats meals together, she thinks her family will eat healthier with her, her desired weight loss is 30-35 lbs, she has been heavy most of her life, she started gaining weight as it has always been a problem, her heaviest weight ever was 215 pounds, she has significant food cravings issues, she snacks frequently in the evenings, she skips meals frequently, she is frequently drinking liquids with calories, she frequently makes poor food choices, she frequently eats larger portions than normal, and she struggles with emotional eating.  Depression Screen Anita Taylor's Food and Mood (modified PHQ-9) score was 4.  Depression screen PHQ 2/9 05/05/2021  Decreased Interest 1  Down, Depressed, Hopeless 0  PHQ - 2 Score 1  Altered sleeping 1  Tired, decreased energy 1  Change in appetite 0  Feeling bad or failure about yourself  0   Trouble concentrating 1  Moving slowly or fidgety/restless 0  Suicidal thoughts 0  PHQ-9 Score 4  Difficult doing work/chores Not difficult at all   Subjective:   1. Other fatigue Anita Taylor admits to daytime somnolence and denies waking up still tired. Patent has a history of symptoms of daytime fatigue. Anita Taylor generally gets 6 or 8 hours of sleep per night, and states that she has generally restful sleep. Snoring is present. Apneic episodes are not present. Epworth Sleepiness Score is 3.  2. Shortness of breath on exertion Anita Taylor notes increasing shortness of breath with exercising and seems to be worsening over time with weight gain. She notes getting out of breath sooner with activity than she used to. This has not gotten worse recently. Anita Taylor denies shortness of breath at rest or orthopnea.  3. Pre-diabetes Anita Taylor has a diagnosis of pre-diabetes based on her elevated HgA1c and was informed this puts her at greater risk of developing diabetes. She continues to work on diet and exercise to decrease her risk of diabetes. She denies nausea or hypoglycemia.  4. Other hyperlipidemia Anita Taylor has hyperlipidemia and has been trying to improve her cholesterol levels with intensive lifestyle modification including a low saturated fat diet, exercise and weight loss. She denies any chest pain, claudication or myalgias.  5. Vitamin D deficiency Anita Taylor is currently taking a multivitamin supplement. She denies nausea, vomiting or muscle weakness.  6. At risk for diabetes mellitus Anita Taylor is at higher than average risk for developing diabetes  due to obesity, pre-diabetes, and being borderline diabetic in recent labs.   Assessment/Plan:   Orders Placed This Encounter  Procedures   Vitamin B12   CBC with Differential/Platelet   Comprehensive metabolic panel   Folate   Hemoglobin A1c   Insulin, random   Lipid Panel With LDL/HDL Ratio   T4, free   TSH   VITAMIN D 25 Hydroxy (Vit-D Deficiency, Fractures)   EKG  12-Lead    Medications Discontinued During This Encounter  Medication Reason   cholecalciferol (VITAMIN D3) 25 MCG (1000 UNIT) tablet    Ascorbic Acid (VITAMIN C ADULT GUMMIES PO)    ELDERBERRY PO      No orders of the defined types were placed in this encounter.    1. Other fatigue Anita Taylor does feel that her weight is causing her energy to be lower than it should be. Fatigue may be related to obesity, depression or many other causes. Labs will be ordered, and in the meanwhile, Anita Taylor will focus on self care including making healthy food choices, increasing physical activity and focusing on stress reduction.  - Vitamin B12 - EKG 12-Lead - Folate  2. Shortness of breath on exertion Anita Taylor does feel that she gets out of breath more easily that she used to when she exercises. Anita Taylor's shortness of breath appears to be obesity related and exercise induced. She has agreed to work on weight loss and gradually increase exercise to treat her exercise induced shortness of breath. Will continue to monitor closely.  3. Pre-diabetes Anita Taylor will continue to work on weight loss, exercise, and decreasing simple carbohydrates to help decrease the risk of diabetes. We will check labs today.  - Hemoglobin A1c - Insulin, random - T4, free - TSH  4. Other hyperlipidemia Cardiovascular risk and specific lipid/LDL goals reviewed. We discussed several lifestyle modifications today. We will check labs today. Anita Taylor will continue to work on diet, exercise and weight loss efforts. Orders and follow up as documented in patient record.   Counseling Intensive lifestyle modifications are the first line treatment for this issue. Dietary changes: Increase soluble fiber. Decrease simple carbohydrates. Exercise changes: Moderate to vigorous-intensity aerobic activity 150 minutes per week if tolerated. Lipid-lowering medications: see documented in medical record.  - CBC with Differential/Platelet - Comprehensive metabolic  panel - Lipid Panel With LDL/HDL Ratio  5. Vitamin D deficiency Low Vitamin D level contributes to fatigue and are associated with obesity, breast, and colon cancer. We will check labs today. Anita Taylor will follow-up for routine testing of Vitamin D, at least 2-3 times per year to avoid over-replacement.  - VITAMIN D 25 Hydroxy (Vit-D Deficiency, Fractures)  6. Screening for depression Anita Taylor had a negative depression screening. Depression is commonly associated with obesity and often results in emotional eating behaviors. We will monitor this closely and work on CBT to help improve the non-hunger eating patterns. Referral to Psychology may be required if no improvement is seen as she continues in our clinic.  7. At risk for diabetes mellitus Anita Taylor was given approximately 15 minutes of diabetes education and counseling today. We discussed intensive lifestyle modifications today with an emphasis on weight loss as well as increasing exercise and decreasing simple carbohydrates in her diet. We also reviewed medication options with an emphasis on risk versus benefit of those discussed.   Repetitive spaced learning was employed today to elicit superior memory formation and behavioral change.  8. Obesity with curreisnt BMI of 34.7 Anita Taylor is currently in the action stage of  change and her goal is to continue with weight loss efforts. I recommend Anita Taylor begin the structured treatment plan as follows:  She has agreed to the Category 2 Plan with lunch options, and keeping a food journal and adhering to recommended goals of 250-350 calories and 25 grams of protein at breakfast daily, plus breakfast options.  Exercise goals: No exercise has been prescribed at this time.   Behavioral modification strategies: increasing lean protein intake, decreasing simple carbohydrates, and planning for success.  She was informed of the importance of frequent follow-up visits to maximize her success with intensive lifestyle  modifications for her multiple health conditions. She was informed we would discuss her lab results at her next visit unless there is a critical issue that needs to be addressed sooner. Anita Taylor agreed to keep her next visit at the agreed upon time to discuss these results.  Objective:   Blood pressure 126/77, pulse 66, temperature 97.9 F (36.6 C), height 5\' 6"  (1.676 m), weight 215 lb (97.5 kg), SpO2 100 %. Body mass index is 34.7 kg/m.  EKG: Normal sinus rhythm, rate 68 BPM.  Indirect Calorimeter completed today shows a VO2 of 253 and a REE of 1742.  Her calculated basal metabolic rate is [id number] thus her basal metabolic rate is better than expected.  General: Cooperative, alert, well developed, in no acute distress. HEENT: Conjunctivae and lids unremarkable. Cardiovascular: Regular rhythm.  Lungs: Normal work of breathing. Neurologic: No focal deficits.   Lab Results  Component Value Date   CREATININE 0.76 05/05/2021   BUN 16 05/05/2021   NA 139 05/05/2021   K 4.6 05/05/2021   CL 104 05/05/2021   CO2 23 05/05/2021   Lab Results  Component Value Date   ALT 16 05/05/2021   AST 21 05/05/2021   ALKPHOS 86 05/05/2021   BILITOT 0.3 05/05/2021   Lab Results  Component Value Date   HGBA1C 5.6 05/05/2021   HGBA1C 5.8 (H) 12/03/2020   HGBA1C 5.6 09/06/2019   HGBA1C 5.5 05/09/2018   HGBA1C 5.6 12/29/2016   Lab Results  Component Value Date   INSULIN 8.4 05/05/2021   INSULIN 15.9 09/06/2019   INSULIN 5.7 05/09/2018   INSULIN 12.7 12/29/2016   Lab Results  Component Value Date   TSH 1.090 05/05/2021   Lab Results  Component Value Date   CHOL 232 (H) 05/05/2021   HDL 68 05/05/2021   LDLCALC 152 (H) 05/05/2021   TRIG 68 05/05/2021   CHOLHDL 3.4 12/03/2020   Lab Results  Component Value Date   WBC 5.5 05/05/2021   HGB 12.9 05/05/2021   HCT 37.7 05/05/2021   MCV 92 05/05/2021   PLT 282 05/05/2021   No results found for: IRON, TIBC, FERRITIN  Attestation Statements:    Reviewed by clinician on day of visit: allergies, medications, problem list, medical history, surgical history, family history, social history, and previous encounter notes.   Trude McburneyI, Sharon Martin, am acting as transcriptionist for Marsh & McLennanDeborah Shonnie Poudrier, DO.  I have reviewed the above documentation for accuracy and completeness, and I agree with the above. Carlye Grippe- Kyannah Climer J Baily Serpe, D.O.  The 21st Century Cures Act was signed into law in 2016 which includes the topic of electronic health records.  This provides immediate access to information in MyChart.  This includes consultation notes, operative notes, office notes, lab results and pathology reports.  If you have any questions about what you read please let us know at your next visit so we can discuss your concerns  and take corrective action if need be.  We are right here with you.

## 2021-05-19 ENCOUNTER — Encounter (INDEPENDENT_AMBULATORY_CARE_PROVIDER_SITE_OTHER): Payer: Self-pay | Admitting: Family Medicine

## 2021-05-19 ENCOUNTER — Ambulatory Visit (INDEPENDENT_AMBULATORY_CARE_PROVIDER_SITE_OTHER): Payer: BC Managed Care – PPO | Admitting: Family Medicine

## 2021-05-19 ENCOUNTER — Other Ambulatory Visit: Payer: Self-pay

## 2021-05-19 VITALS — BP 117/82 | HR 83 | Temp 98.0°F | Ht 66.0 in | Wt 206.0 lb

## 2021-05-19 DIAGNOSIS — E559 Vitamin D deficiency, unspecified: Secondary | ICD-10-CM

## 2021-05-19 DIAGNOSIS — Z6833 Body mass index (BMI) 33.0-33.9, adult: Secondary | ICD-10-CM

## 2021-05-19 DIAGNOSIS — G43909 Migraine, unspecified, not intractable, without status migrainosus: Secondary | ICD-10-CM | POA: Diagnosis not present

## 2021-05-19 DIAGNOSIS — E7849 Other hyperlipidemia: Secondary | ICD-10-CM | POA: Diagnosis not present

## 2021-05-19 DIAGNOSIS — R7303 Prediabetes: Secondary | ICD-10-CM

## 2021-05-19 DIAGNOSIS — Z9189 Other specified personal risk factors, not elsewhere classified: Secondary | ICD-10-CM

## 2021-05-19 DIAGNOSIS — E669 Obesity, unspecified: Secondary | ICD-10-CM

## 2021-05-19 NOTE — Patient Instructions (Signed)
The 10-year ASCVD risk score (Arnett DK, et al., 2019) is: 1.7%   Values used to calculate the score:     Age: 56 years     Sex: Female     Is Non-Hispanic African American: Yes     Diabetic: No     Tobacco smoker: No     Systolic Blood Pressure: 117 mmHg     Is BP treated: No     HDL Cholesterol: 68 mg/dL     Total Cholesterol: 232 mg/dL

## 2021-05-20 NOTE — Progress Notes (Signed)
Chief Complaint:   OBESITY Anita Taylor is here to discuss her progress with her obesity treatment plan along with follow-up of her obesity related diagnoses. Anita Taylor is on the Category 2 Plan with lunch options and keeping a food journal and adhering to recommended goals of 250-350 calories and 25 grams protein with breakfast options and states she is following her eating plan approximately 95% of the time. Anita Taylor states she is walking 30 minutes 3 times per week.  Today's visit was #: 2 Starting weight: 215 lbs Starting date: 05/05/2021 Today's weight: 206 lbs Today's date: 05/19/2021 Total lbs lost to date: 9 Total lbs lost since last in-office visit: 9  Interim History: Anita Taylor is here today for her first follow-up office visit since starting the program with [place].  All blood work/ lab tests that were recently ordered by myself or an outside provider were reviewed with patient today per their request.   Extended time was spent counseling her on all new disease processes that were discovered or preexisting ones that are affected by BMI.  she understands that many of these abnormalities will need to monitored regularly along with the current treatment plan of prudent dietary changes, in which we are making each and every office visit, to improve these health parameters. Anita Taylor is here for a follow up office visit.  We reviewed her meal plan and questions were answered.  Patient's food recall appears to be accurate and consistent with what is on plan when she is following it.   When eating on plan, her hunger and cravings are well controlled.   Pt's snack calories are skinny pop and extra dressing. She denies hunger or cravings.  Subjective:   1. Prediabetes Discussed labs with patient today. Pt denies cravings or concerns. Medication: None  2. Other hyperlipidemia Worsening. Discussed labs with patient today. Medication: None  The 10-year ASCVD risk score (Arnett DK, et al.,  2019) is: 1.7%   Values used to calculate the score:     Age: 56 years     Sex: Female     Is Non-Hispanic African American: Yes     Diabetic: No     Tobacco smoker: No     Systolic Blood Pressure: 123XX123 mmHg     Is BP treated: No     HDL Cholesterol: 68 mg/dL     Total Cholesterol: 232 mg/dL  3. Vitamin D deficiency Discussed labs with patient today. Pt takes a multivitamin QD.  4. Migraine without status migrainosus, not intractable, unspecified migraine type Discussed labs with patient today. Symptoms are currently under control. No evidence of dehydration.  5. At risk for heart disease Anita Taylor is at a higher than average risk for cardiovascular disease due to obesity and hyperlipidemia.  Assessment/Plan:  No orders of the defined types were placed in this encounter.   There are no discontinued medications.   No orders of the defined types were placed in this encounter.    1. Prediabetes Anita Taylor will continue to work on weight loss, exercise, and decreasing simple carbohydrates to help decrease the risk of diabetes.   2. Other hyperlipidemia Cardiovascular risk and specific lipid/LDL goals reviewed.  We discussed several lifestyle modifications today and Anita Taylor will continue to work on diet, exercise and weight loss efforts. Orders and follow up as documented in patient record. No need for meds at this time.  Counseling Intensive lifestyle modifications are the first line treatment for this issue. Dietary changes: Increase soluble  fiber. Decrease simple carbohydrates. Exercise changes: Moderate to vigorous-intensity aerobic activity 150 minutes per week if tolerated. Lipid-lowering medications: see documented in medical record.  3. Vitamin D deficiency At goal. Low Vitamin D level contributes to fatigue and are associated with obesity, breast, and colon cancer. She agrees to continue multivitamin QD and will follow-up for routine testing of Vitamin D, at least 2-3 times per year  to avoid over-replacement.  4. Migraine without status migrainosus, not intractable, unspecified migraine type Stable. No concerns at this time.  5. At risk for heart disease Anita Taylor was given approximately 10 minutes of coronary artery disease prevention counseling today. She is 56 y.o. female and has risk factors for heart disease including obesity. We discussed intensive lifestyle modifications today with an emphasis on specific weight loss instructions and strategies.   Repetitive spaced learning was employed today to elicit superior memory formation and behavioral change.   6. Obesity with current BMI of 33.3 Anita Taylor is currently in the action stage of change. As such, her goal is to continue with weight loss efforts. She has agreed to the Category 2 Plan with lunch options and keeping a food journal and adhering to recommended goals of 250-350 calories and 25 grams protein with breakfast options.   Exercise goals:  As is  Behavioral modification strategies: increasing lean protein intake, decreasing simple carbohydrates, and planning for success.  Anita Taylor has agreed to follow-up with our clinic in 2 weeks. She was informed of the importance of frequent follow-up visits to maximize her success with intensive lifestyle modifications for her multiple health conditions.   Objective:   Blood pressure 117/82, pulse 83, temperature 98 F (36.7 C), height 5\' 6"  (1.676 m), weight 206 lb (93.4 kg), SpO2 98 %. Body mass index is 33.25 kg/m.  General: Cooperative, alert, well developed, in no acute distress. HEENT: Conjunctivae and lids unremarkable. Cardiovascular: Regular rhythm.  Lungs: Normal work of breathing. Neurologic: No focal deficits.   Lab Results  Component Value Date   CREATININE 0.76 05/05/2021   BUN 16 05/05/2021   NA 139 05/05/2021   K 4.6 05/05/2021   CL 104 05/05/2021   CO2 23 05/05/2021   Lab Results  Component Value Date   ALT 16 05/05/2021   AST 21 05/05/2021    ALKPHOS 86 05/05/2021   BILITOT 0.3 05/05/2021   Lab Results  Component Value Date   HGBA1C 5.6 05/05/2021   HGBA1C 5.8 (H) 12/03/2020   HGBA1C 5.6 09/06/2019   HGBA1C 5.5 05/09/2018   HGBA1C 5.6 12/29/2016   Lab Results  Component Value Date   INSULIN 8.4 05/05/2021   INSULIN 15.9 09/06/2019   INSULIN 5.7 05/09/2018   INSULIN 12.7 12/29/2016   Lab Results  Component Value Date   TSH 1.090 05/05/2021   Lab Results  Component Value Date   CHOL 232 (H) 05/05/2021   HDL 68 05/05/2021   LDLCALC 152 (H) 05/05/2021   TRIG 68 05/05/2021   CHOLHDL 3.4 12/03/2020   Lab Results  Component Value Date   VD25OH 49.0 05/05/2021   VD25OH 43.3 12/03/2020   VD25OH 33.9 09/06/2019   Lab Results  Component Value Date   WBC 5.5 05/05/2021   HGB 12.9 05/05/2021   HCT 37.7 05/05/2021   MCV 92 05/05/2021   PLT 282 05/05/2021     Attestation Statements:   Reviewed by clinician on day of visit: allergies, medications, problem list, medical history, surgical history, family history, social history, and previous encounter notes.  I,  Kathlene November, CMA, am acting as transcriptionist for [employer], DO.  I have reviewed the above documentation for accuracy and completeness, and I agree with the above. Marjory Sneddon, D.O.  The [place] was signed into law in 2016 which includes the topic of electronic health records.  This provides immediate access to information in MyChart.  This includes consultation notes, operative notes, office notes, lab results and pathology reports.  If you have any questions about what you read please let us know at your next visit so we can discuss your concerns and take corrective action if need be.  We are right here with you.

## 2021-06-03 ENCOUNTER — Ambulatory Visit (INDEPENDENT_AMBULATORY_CARE_PROVIDER_SITE_OTHER): Payer: BC Managed Care – PPO | Admitting: Family Medicine

## 2021-06-03 ENCOUNTER — Encounter (INDEPENDENT_AMBULATORY_CARE_PROVIDER_SITE_OTHER): Payer: Self-pay | Admitting: Family Medicine

## 2021-06-03 ENCOUNTER — Other Ambulatory Visit: Payer: Self-pay

## 2021-06-03 VITALS — BP 119/79 | HR 59 | Temp 98.1°F | Ht 66.0 in | Wt 203.0 lb

## 2021-06-03 DIAGNOSIS — K5900 Constipation, unspecified: Secondary | ICD-10-CM

## 2021-06-03 DIAGNOSIS — Z6832 Body mass index (BMI) 32.0-32.9, adult: Secondary | ICD-10-CM

## 2021-06-03 DIAGNOSIS — E559 Vitamin D deficiency, unspecified: Secondary | ICD-10-CM | POA: Diagnosis not present

## 2021-06-03 DIAGNOSIS — Z6834 Body mass index (BMI) 34.0-34.9, adult: Secondary | ICD-10-CM

## 2021-06-03 DIAGNOSIS — E669 Obesity, unspecified: Secondary | ICD-10-CM | POA: Diagnosis not present

## 2021-06-03 MED ORDER — VITAMIN D3 25 MCG (1000 UT) PO CAPS: 1000.0000 [IU] | ORAL_CAPSULE | Freq: Every day | ORAL | Status: AC

## 2021-06-03 MED ORDER — MAGNESIUM OXIDE 400 MG PO TABS: 800.0000 mg | ORAL_TABLET | Freq: Every day | ORAL | 3 refills | Status: AC

## 2021-06-08 NOTE — Progress Notes (Signed)
Chief Complaint:   OBESITY Anita Taylor is here to discuss her progress with her obesity treatment plan along with follow-up of her obesity related diagnoses. Anita Taylor is on the Category 2 Plan and keeping a food journal and adhering to recommended goals of 250-350 calories and 25 grams protein with breakfast options and states she is following her eating plan approximately 95% of the time. Anita Taylor states she is walking 30 minutes 3 times per week.  Today's visit was #: 3 Starting weight: 215 lbs Starting date: 05/05/2021 Today's weight: 203 lbs Today's date: 06/03/2021 Total lbs lost to date: 12 Total lbs lost since last in-office visit: 3  Interim History: Pt gained almost 3 lbs in muscle and lost 6 lbs in fat mass since her last OV.  Doing great.  She denies hunger or cravings.    Subjective:   1. Constipation, unspecified constipation type Pt drinks 90+ oz water per day. She is taking magnesium supplements QD. Her symptoms have improved since being on the plan. She denies concerns.   2. Vitamin D deficiency Pt reports fatigue is improving.    Assessment/Plan:   Meds ordered this encounter  Medications   Cholecalciferol (VITAMIN D3) 25 MCG (1000 UT) CAPS    Sig: Take 1 capsule (1,000 Units total) by mouth daily.    Dispense:  60 capsule   magnesium oxide (MAG-OX) 400 MG tablet    Sig: Take 2 tablets (800 mg total) by mouth at bedtime.    Dispense:  90 tablet    Refill:  3     1. Constipation, unspecified constipation type Continue 100 oz water per day and continue magnesium supplement QD.  Refill- magnesium oxide (MAG-OX) 400 MG tablet; Take 2 tablets (800 mg total) by mouth at bedtime.  Dispense: 90 tablet; Refill: 3  2. Vitamin D deficiency Low Vitamin D level contributes to fatigue and are associated with obesity, breast, and colon cancer. She agrees to continue to take OTC Vitamin D 1,000 IU QD and will follow-up for routine testing of Vitamin D, at least 2-3 times per  year to avoid over-replacement.  Refill- Cholecalciferol (VITAMIN D3) 25 MCG (1000 UT) CAPS; Take 1 capsule (1,000 Units total) by mouth daily.  Dispense: 60 capsule  3. Obesity with current BMI of 32.9 Anita Taylor is currently in the action stage of change. As such, her goal is to continue with weight loss efforts. She has agreed to the Category 2 Plan  with Lunch options  PLUS  option of journaling Breakfast at 250-350 calories and 25 grams protein  Exercise goals:  As is  Behavioral modification strategies: increasing lean protein intake and avoiding temptations.  Anita Taylor has agreed to follow-up with our clinic in 2 - 2.5 weeks. She was informed of the importance of frequent follow-up visits to maximize her success with intensive lifestyle modifications for her multiple health conditions.   Objective:   Blood pressure 119/79, pulse (!) 59, temperature 98.1 F (36.7 C), height 5\' 6"  (1.676 m), weight 203 lb (92.1 kg), SpO2 100 %. Body mass index is 32.77 kg/m.  General: Cooperative, alert, well developed, in no acute distress. HEENT: Conjunctivae and lids unremarkable. Cardiovascular: Regular rhythm.  Lungs: Normal work of breathing. Neurologic: No focal deficits.   Lab Results  Component Value Date   CREATININE 0.76 05/05/2021   BUN 16 05/05/2021   NA 139 05/05/2021   K 4.6 05/05/2021   CL 104 05/05/2021   CO2 23 05/05/2021   Lab Results  Component Value Date   ALT 16 05/05/2021   AST 21 05/05/2021   ALKPHOS 86 05/05/2021   BILITOT 0.3 05/05/2021   Lab Results  Component Value Date   HGBA1C 5.6 05/05/2021   HGBA1C 5.8 (H) 12/03/2020   HGBA1C 5.6 09/06/2019   HGBA1C 5.5 05/09/2018   HGBA1C 5.6 12/29/2016   Lab Results  Component Value Date   INSULIN 8.4 05/05/2021   INSULIN 15.9 09/06/2019   INSULIN 5.7 05/09/2018   INSULIN 12.7 12/29/2016   Lab Results  Component Value Date   TSH 1.090 05/05/2021   Lab Results  Component Value Date   CHOL 232 (H) 05/05/2021    HDL 68 05/05/2021   LDLCALC 152 (H) 05/05/2021   TRIG 68 05/05/2021   CHOLHDL 3.4 12/03/2020   Lab Results  Component Value Date   VD25OH 49.0 05/05/2021   VD25OH 43.3 12/03/2020   VD25OH 33.9 09/06/2019   Lab Results  Component Value Date   WBC 5.5 05/05/2021   HGB 12.9 05/05/2021   HCT 37.7 05/05/2021   MCV 92 05/05/2021   PLT 282 05/05/2021    Attestation Statements:   Reviewed by clinician on day of visit: allergies, medications, problem list, medical history, surgical history, family history, social history, and previous encounter notes.  Edmund Hilda, CMA, am acting as transcriptionist for [employer], DO.  I have reviewed the above documentation for accuracy and completeness, and I agree with the above. Carlye Grippe, D.O.  The 21st Century Cures Act was signed into law in 2016 which includes the topic of electronic health records.  This provides immediate access to information in MyChart.  This includes consultation notes, operative notes, office notes, lab results and pathology reports.  If you have any questions about what you read please let us know at your next visit so we can discuss your concerns and take corrective action if need be.  We are right here with you.

## 2021-06-18 ENCOUNTER — Ambulatory Visit (INDEPENDENT_AMBULATORY_CARE_PROVIDER_SITE_OTHER): Payer: BC Managed Care – PPO | Admitting: Family Medicine

## 2021-07-06 ENCOUNTER — Ambulatory Visit (INDEPENDENT_AMBULATORY_CARE_PROVIDER_SITE_OTHER): Payer: BC Managed Care – PPO | Admitting: Family Medicine

## 2021-07-06 ENCOUNTER — Encounter (INDEPENDENT_AMBULATORY_CARE_PROVIDER_SITE_OTHER): Payer: Self-pay | Admitting: Family Medicine

## 2021-07-06 ENCOUNTER — Other Ambulatory Visit: Payer: Self-pay

## 2021-07-06 VITALS — BP 109/71 | HR 66 | Temp 97.9°F | Ht 66.0 in | Wt 199.0 lb

## 2021-07-06 DIAGNOSIS — E559 Vitamin D deficiency, unspecified: Secondary | ICD-10-CM | POA: Diagnosis not present

## 2021-07-06 DIAGNOSIS — Z6832 Body mass index (BMI) 32.0-32.9, adult: Secondary | ICD-10-CM

## 2021-07-06 DIAGNOSIS — Z6834 Body mass index (BMI) 34.0-34.9, adult: Secondary | ICD-10-CM

## 2021-07-06 DIAGNOSIS — E669 Obesity, unspecified: Secondary | ICD-10-CM

## 2021-07-06 DIAGNOSIS — K5909 Other constipation: Secondary | ICD-10-CM

## 2021-07-06 DIAGNOSIS — Z9189 Other specified personal risk factors, not elsewhere classified: Secondary | ICD-10-CM

## 2021-07-06 DIAGNOSIS — G43909 Migraine, unspecified, not intractable, without status migrainosus: Secondary | ICD-10-CM | POA: Diagnosis not present

## 2021-07-06 MED ORDER — SUMATRIPTAN SUCCINATE 100 MG PO TABS: ORAL_TABLET | ORAL | 0 refills | Status: DC

## 2021-07-06 NOTE — Progress Notes (Unsigned)
Chief Complaint:   OBESITY Anita Taylor is here to discuss her progress with her obesity treatment plan along with follow-up of her obesity related diagnoses. Anita Taylor is on the Category 2 Plan with lunch options and keeping a food journal and adhering to recommended goals of 250-350 calories and 25 grams of protein at breakfast and states she is following her eating plan approximately 90% of the time. Anita Taylor states she is Walking Away the Pounds for 30-40 minutes 3-4 times per week.  Today's visit was #: 4 Starting weight: 215 lbs Starting date: 05/05/2021 Today's weight: 199 lbs Today's date: 07/06/2021 Total lbs lost to date: 16 lbs Total lbs lost since last in-office visit: 4 lbs  Interim History: She increased her exercise, "Walk Away the Pounds" and Walk at Home by Anita Taylor.  She will be going to [facility] for pleasure for 5 days in the near future.  Subjective:   1. Other constipation Well controlled currently.  2. Vitamin D deficiency She is currently taking OTC vitamin D 1000 IU each day. She denies nausea, vomiting or muscle weakness.  3. Migraine without status migrainosus, not intractable, unspecified migraine type Denies having a headache in a long time, but would like to have medication on hand.  Assessment/Plan:   1. Other constipation Continue magnesium oxide.  Increase water as she admits her intake has not been as good lately.  She should be drinking 1/2 her weight in ounces of water each day.  2. Vitamin D deficiency Continue 1000 IU daily OTC. Vitamin D level was 49 two months ago.  Will recheck at the end of winter ***  3. Migraine without status migrainosus, not intractable, unspecified migraine type Refill Imitrex.  Use as needed.  Increase water intake.  Continue exercise and avoid triggers.  - Refill SUMAtriptan (IMITREX) 100 MG tablet; May repeat in 2 hours if headache persists or recurs.  Dispense: 10 tablet; Refill: 0  4. Obesity with current BMI of  32.1  Anita Taylor is currently in the action stage of change. As such, her goal is to continue with weight loss efforts. She has agreed to the Category 2 Plan.   Exercise goals:  As is.  Behavioral modification strategies: travel eating strategies.  Anita Taylor has agreed to follow-up with our clinic in 4 weeks. She was informed of the importance of frequent follow-up visits to maximize her success with intensive lifestyle modifications for her multiple health conditions.   Objective:   Blood pressure 109/71, pulse 66, temperature 97.9 F (36.6 C), height 5\' 6"  (1.676 m), weight 199 lb (90.3 kg), SpO2 98 %. Body mass index is 32.12 kg/m.  General: Cooperative, alert, well developed, in no acute distress. HEENT: Conjunctivae and lids unremarkable. Cardiovascular: Regular rhythm.  Lungs: Normal work of breathing. Neurologic: No focal deficits.   Lab Results  Component Value Date   CREATININE 0.76 05/05/2021   BUN 16 05/05/2021   NA 139 05/05/2021   K 4.6 05/05/2021   CL 104 05/05/2021   CO2 23 05/05/2021   Lab Results  Component Value Date   ALT 16 05/05/2021   AST 21 05/05/2021   ALKPHOS 86 05/05/2021   BILITOT 0.3 05/05/2021   Lab Results  Component Value Date   HGBA1C 5.6 05/05/2021   HGBA1C 5.8 (H) 12/03/2020   HGBA1C 5.6 09/06/2019   HGBA1C 5.5 05/09/2018   HGBA1C 5.6 12/29/2016   Lab Results  Component Value Date   INSULIN 8.4 05/05/2021   INSULIN 15.9 09/06/2019  INSULIN 5.7 05/09/2018   INSULIN 12.7 12/29/2016   Lab Results  Component Value Date   TSH 1.090 05/05/2021   Lab Results  Component Value Date   CHOL 232 (H) 05/05/2021   HDL 68 05/05/2021   LDLCALC 152 (H) 05/05/2021   TRIG 68 05/05/2021   CHOLHDL 3.4 12/03/2020   Lab Results  Component Value Date   VD25OH 49.0 05/05/2021   VD25OH 43.3 12/03/2020   VD25OH 33.9 09/06/2019   Lab Results  Component Value Date   WBC 5.5 05/05/2021   HGB 12.9 05/05/2021   HCT 37.7 05/05/2021   MCV 92  05/05/2021   PLT 282 05/05/2021   Attestation Statements:   Reviewed by clinician on day of visit: allergies, medications, problem list, medical history, surgical history, family history, social history, and previous encounter notes.  I, Insurance claims handler, CMA, am acting as Energy manager for [employer], DO.  I have reviewed the above documentation for accuracy and completeness, and I agree with the above. -  ***

## 2021-08-03 ENCOUNTER — Encounter (INDEPENDENT_AMBULATORY_CARE_PROVIDER_SITE_OTHER): Payer: Self-pay | Admitting: Family Medicine

## 2021-08-03 ENCOUNTER — Ambulatory Visit (INDEPENDENT_AMBULATORY_CARE_PROVIDER_SITE_OTHER): Payer: BC Managed Care – PPO | Admitting: Family Medicine

## 2021-08-03 VITALS — BP 112/72 | HR 60 | Temp 98.2°F | Ht 66.0 in | Wt 198.0 lb

## 2021-08-03 DIAGNOSIS — E7849 Other hyperlipidemia: Secondary | ICD-10-CM | POA: Diagnosis not present

## 2021-08-03 DIAGNOSIS — Z9189 Other specified personal risk factors, not elsewhere classified: Secondary | ICD-10-CM

## 2021-08-03 DIAGNOSIS — K5900 Constipation, unspecified: Secondary | ICD-10-CM | POA: Diagnosis not present

## 2021-08-03 DIAGNOSIS — Z6832 Body mass index (BMI) 32.0-32.9, adult: Secondary | ICD-10-CM

## 2021-08-03 DIAGNOSIS — G43809 Other migraine, not intractable, without status migrainosus: Secondary | ICD-10-CM | POA: Diagnosis not present

## 2021-08-03 DIAGNOSIS — E669 Obesity, unspecified: Secondary | ICD-10-CM

## 2021-08-03 NOTE — Patient Instructions (Signed)
The 10-year ASCVD risk score (Arnett DK, et al., 2019) is: 1.5% ?  Values used to calculate the score: ?    Age: 56 years ?    Sex: Female ?    Is Non-Hispanic African American: Yes ?    Diabetic: No ?    Tobacco smoker: No ?    Systolic Blood Pressure: 112 mmHg ?    Is BP treated: No ?    HDL Cholesterol: 68 mg/dL ?    Total Cholesterol: 232 mg/dL ? ?

## 2021-08-04 NOTE — Progress Notes (Signed)
? ? ? ?Chief Complaint:  ? ?OBESITY ?Anita Taylor is here to discuss her progress with her obesity treatment plan along with follow-up of her obesity related diagnoses. Anita Taylor is on the Category 2 Plan and states she is following her eating plan approximately 50% of the time. Anita Taylor states she is walking for 30 minutes 3 times per week. ? ?Today's visit was #: 5 ?Starting weight: 215 lbs ?Starting date: 05/05/2021 ?Today's weight: 198 lbs ?Today's date: 08/03/2021 ?Total lbs lost to date: 17 ?Total lbs lost since last in-office visit: 1 ? ?Interim History: Anita Taylor went to [facility] for 5 days. She has increased her water intake. She denies cravings or hunger. She has an upcoming birthday and will be going to see her daughter in [place].  ? ?Subjective:  ? ?1. Other migraine without status migrainosus, not intractable ?Anita Taylor took Imitrex only 1 time in the past 4 weeks. No cravings persay.  ? ?2. Constipation, unspecified constipation type ?Anita Taylor's constipation is under good control. She is taking magnesium 325 mg daily. Also tries to get in 6 bottles of water.  ? ?3. Other hyperlipidemia ?Anita Taylor's LDL 3 months ago was 152. She is not on medications. The 10-year ASCVD risk score (Arnett DK, et al., 2019) is: 1.5% ?  Values used to calculate the score: ?    Age: 56 years ?    Sex: Female ?    Is Non-Hispanic African American: Yes ?    Diabetic: No ?    Tobacco smoker: No ?    Systolic Blood Pressure: 112 mmHg ?    Is BP treated: No ?    HDL Cholesterol: 68 mg/dL ?    Total Cholesterol: 232 mg/dL ? ?4. At risk for impaired metabolic function ?Anita Taylor is at increased risk for impaired metabolic function due to pre-diabetes and obesity.  ? ?Assessment/Plan:  ?No orders of the defined types were placed in this encounter. ? ? ?There are no discontinued medications.  ? ?No orders of the defined types were placed in this encounter. ?  ? ?1. Other migraine without status migrainosus, not intractable ?Anita Taylor will continue adequate hydration, stress  relieving activities, such as exercise and self-care activity. Will consider Topamax in the future as needed.  ? ?2. Constipation, unspecified constipation type ?Anita Taylor will continue her exercise, and will continue adequate hydration and magnesium supply daily. Symptoms controlled.  ? ?3. Other hyperlipidemia ?Anita Taylor will continue her prudent nutritional plan, decrease saturated and trans fats. She prefers to wait on rechecking her labs until 2 office visit from now.  ? ?4. At risk for impaired metabolic function ?Anita Taylor was given approximately 9 minutes of impaired  metabolic function prevention counseling today. We discussed intensive lifestyle modifications today with an emphasis on specific nutrition and exercise instructions and strategies.  ? ?Repetitive spaced learning was employed today to elicit superior memory formation and behavioral change. ? ?5. Obesity with current BMI of 32.0 ?Anita Taylor is currently in the action stage of change. As such, her goal is to continue with weight loss efforts. She has agreed to the Category 2 Plan.  ? ?Exercise goals: For substantial health benefits, adults should do at least 150 minutes (2 hours and 30 minutes) a week of moderate-intensity, or 75 minutes (1 hour and 15 minutes) a week of vigorous-intensity aerobic physical activity, or an equivalent combination of moderate- and vigorous-intensity aerobic activity. Aerobic activity should be performed in episodes of at least 10 minutes, and preferably, it should be spread throughout the week. ? ?Anita Taylor's  goal is to increase her exercise to 30 minutes 5 days per week; and continue to try to hit 6 bottles of water  per day. ? ?Behavioral modification strategies: travel eating strategies and celebration eating strategies. ? ?Anita Taylor has agreed to follow-up with our clinic in 4 to 5 weeks. She was informed of the importance of frequent follow-up visits to maximize her success with intensive lifestyle modifications for her multiple health  conditions.  ? ?Objective:  ? ?Blood pressure 112/72, pulse 60, temperature 98.2 ?F (36.8 ?C), height 5\' 6"  (1.676 m), weight 198 lb (89.8 kg), SpO2 99 %. ?Body mass index is 31.96 kg/m?. ? ?General: Cooperative, alert, well developed, in no acute distress. ?HEENT: Conjunctivae and lids unremarkable. ?Cardiovascular: Regular rhythm.  ?Lungs: Normal work of breathing. ?Neurologic: No focal deficits.  ? ?Lab Results  ?Component Value Date  ? CREATININE 0.76 05/05/2021  ? BUN 16 05/05/2021  ? NA 139 05/05/2021  ? K 4.6 05/05/2021  ? CL 104 05/05/2021  ? CO2 23 05/05/2021  ? ?Lab Results  ?Component Value Date  ? ALT 16 05/05/2021  ? AST 21 05/05/2021  ? ALKPHOS 86 05/05/2021  ? BILITOT 0.3 05/05/2021  ? ?Lab Results  ?Component Value Date  ? HGBA1C 5.6 05/05/2021  ? HGBA1C 5.8 (H) 12/03/2020  ? HGBA1C 5.6 09/06/2019  ? HGBA1C 5.5 05/09/2018  ? HGBA1C 5.6 12/29/2016  ? ?Lab Results  ?Component Value Date  ? INSULIN 8.4 05/05/2021  ? INSULIN 15.9 09/06/2019  ? INSULIN 5.7 05/09/2018  ? INSULIN 12.7 12/29/2016  ? ?Lab Results  ?Component Value Date  ? TSH 1.090 05/05/2021  ? ?Lab Results  ?Component Value Date  ? CHOL 232 (H) 05/05/2021  ? HDL 68 05/05/2021  ? LDLCALC 152 (H) 05/05/2021  ? TRIG 68 05/05/2021  ? CHOLHDL 3.4 12/03/2020  ? ?Lab Results  ?Component Value Date  ? VD25OH 49.0 05/05/2021  ? VD25OH 43.3 12/03/2020  ? VD25OH 33.9 09/06/2019  ? ?Lab Results  ?Component Value Date  ? WBC 5.5 05/05/2021  ? HGB 12.9 05/05/2021  ? HCT 37.7 05/05/2021  ? MCV 92 05/05/2021  ? PLT 282 05/05/2021  ? ?No results found for: IRON, TIBC, FERRITIN ? ?Attestation Statements:  ? ?Reviewed by clinician on day of visit: allergies, medications, problem list, medical history, surgical history, family history, social history, and previous encounter notes. ? ? ?I, 07/03/2021, am acting as transcriptionist for Burt Knack, DO. ? ?I have reviewed the above documentation for accuracy and completeness, and I agree with the above. [employer], D.O. ? ?The 21st Century Cures Act was signed into law in 2016 which includes the topic of electronic health records.  This provides immediate access to information in MyChart.  This includes consultation notes, operative notes, office notes, lab results and pathology reports.  If you have any questions about what you read please let 2017 know at your next visit so we can discuss your concerns and take corrective action if need be.  We are right here with you. ? ? ?

## 2021-09-09 ENCOUNTER — Ambulatory Visit (INDEPENDENT_AMBULATORY_CARE_PROVIDER_SITE_OTHER): Payer: BC Managed Care – PPO | Admitting: Family Medicine

## 2021-10-09 ENCOUNTER — Other Ambulatory Visit: Payer: Self-pay | Admitting: Family Medicine

## 2021-10-09 DIAGNOSIS — Z1231 Encounter for screening mammogram for malignant neoplasm of breast: Secondary | ICD-10-CM

## 2021-10-28 ENCOUNTER — Other Ambulatory Visit (INDEPENDENT_AMBULATORY_CARE_PROVIDER_SITE_OTHER): Payer: Self-pay | Admitting: Family Medicine

## 2021-10-28 ENCOUNTER — Telehealth: Payer: Self-pay | Admitting: Family Medicine

## 2021-10-28 DIAGNOSIS — G43909 Migraine, unspecified, not intractable, without status migrainosus: Secondary | ICD-10-CM

## 2021-10-28 MED ORDER — SUMATRIPTAN SUCCINATE 100 MG PO TABS: ORAL_TABLET | ORAL | 0 refills | Status: DC

## 2021-10-28 NOTE — Telephone Encounter (Signed)
Pt needs refill on imitrex, said you have filled it in the past. [facility] would like for you to fill it please at [facility].

## 2021-10-28 NOTE — Telephone Encounter (Signed)
done

## 2021-11-05 ENCOUNTER — Ambulatory Visit
Admission: RE | Admit: 2021-11-05 | Discharge: 2021-11-05 | Disposition: A | Discharge status: Home / Self Care | Payer: BC Managed Care – PPO | Source: Ambulatory Visit | Attending: Family Medicine | Admitting: Family Medicine

## 2021-11-05 DIAGNOSIS — Z1231 Encounter for screening mammogram for malignant neoplasm of breast: Secondary | ICD-10-CM

## 2021-11-24 ENCOUNTER — Other Ambulatory Visit: Payer: Self-pay | Admitting: Family Medicine

## 2021-11-24 DIAGNOSIS — G43909 Migraine, unspecified, not intractable, without status migrainosus: Secondary | ICD-10-CM

## 2021-11-24 NOTE — Telephone Encounter (Signed)
This was just filled 7/5 for #10.  Not sure if this is an auto-refill or not, but that is more frequent use than typical, and should be discussed.  See if she truly needs this, and if so, you can offer her an earlier appt to discuss her migraines rather than waiting for a physical at the end of the month

## 2021-11-24 NOTE — Telephone Encounter (Signed)
Refill request on Sumatriptan last apt was 12/03/20 next apt is 12/21/21.

## 2021-12-02 ENCOUNTER — Encounter (INDEPENDENT_AMBULATORY_CARE_PROVIDER_SITE_OTHER): Payer: Self-pay

## 2021-12-07 ENCOUNTER — Encounter: Payer: BC Managed Care – PPO | Admitting: Family Medicine

## 2021-12-21 ENCOUNTER — Encounter: Payer: BC Managed Care – PPO | Admitting: Family Medicine

## 2021-12-30 ENCOUNTER — Encounter: Payer: Self-pay | Admitting: Internal Medicine

## 2022-01-21 ENCOUNTER — Encounter: Payer: BC Managed Care – PPO | Admitting: Family Medicine

## 2022-02-23 NOTE — Patient Instructions (Incomplete)
  HEALTH MAINTENANCE RECOMMENDATIONS:  It is recommended that you get at least 30 minutes of aerobic exercise at least 5 days/week (for weight loss, you may need as much as 60-90 minutes). This can be any activity that gets your heart rate up. This can be divided in 10-15 minute intervals if needed, but try and build up your endurance at least once a week.  Weight bearing exercise is also recommended twice weekly.  Eat a healthy diet with lots of vegetables, fruits and fiber.  "Colorful" foods have a lot of vitamins (ie green vegetables, tomatoes, red peppers, etc).  Limit sweet tea, regular sodas and alcoholic beverages, all of which has a lot of calories and sugar.  Up to 1 alcoholic drink daily may be beneficial for women (unless trying to lose weight, watch sugars).  Drink a lot of water.  Calcium recommendations are 1200-1500 mg daily (1500 mg for postmenopausal women or women without ovaries), and vitamin D 1000 IU daily.  This should be obtained from diet and/or supplements (vitamins), and calcium should not be taken all at once, but in divided doses.  Monthly self breast exams and yearly mammograms for women over the age of 54 is recommended.  Sunscreen of at least SPF 30 should be used on all sun-exposed parts of the skin when outside between the hours of 10 am and 4 pm (not just when at beach or pool, but even with exercise, golf, tennis, and yard work!)  Use a sunscreen that says "broad spectrum" so it covers both UVA and UVB rays, and make sure to reapply every 1-2 hours.  Remember to change the batteries in your smoke detectors when changing your clock times in the spring and fall. Carbon monoxide detectors are recommended for your home.  Use your seat belt every time you are in a car, and please drive safely and not be distracted with cell phones and texting while driving.  Return in 2 weeks for your tetanus booster (TdaP). Consider counseling through EAP program through your  job.  Consider using an app to help with logging foods and monitoring weight. If weight goes up rather than improves as you institute the changes we discussed (portion control, healthier food options), then you can always restart the weight loss clinic.

## 2022-02-23 NOTE — Progress Notes (Unsigned)
No chief complaint on file.  Anita Taylor is a 56 y.o. female who presents for a complete physical.    She was last seen by me at her physical in 11/2020.  She had been under the care of the weight loss clinic for her obesity.  She had been 256# at physical in 10/2017, 235# 3.2 oz at physical 10/2018, 224# in 2021.  She was 209# at physical 11/2020. Last weight at [facility] clinic was 198# in 07/2021.  Wt Readings from Last 3 Encounters:  08/03/21 198 lb (89.8 kg)  07/06/21 199 lb (90.3 kg)  06/03/21 203 lb (92.1 kg)    Hematuria:  microscopic, noted for many years.  Had CT 11/2019 of abdomen and pelvis, due to her L flank pain that she was having at the time, and hematuria.  This was normal.  Has never seen urologist.   Migraines:   She uses sumatriptan prn, last filled #10 in 10/2021.  This remains effective.  She uses Excedrin if taken early in the day (otherwise causes insomnia).  Migraines occur about every 3 months, imitrex helps, and no longer gets cluster where they last for 3 days.   (Saw neuro in 11/2016 when having more frequent migraines, didn't tolerate the prescribed ER propranolol. Thinks some of the headaches were hormonal; headaches better since postmenopausal. She never had the MRI or sleep study as recommended by Dr. Jaynee Eagles).  Postmenopausal--She is having some hot flashes and night sweats, worse with caffeine or certain foods/sweets/wine. These remain tolerable, mainly at night.  Vitamin D deficiency:  Last level was 49 in 04/2021, and 43.3 in 11/2020, when taking 1000 IU every other day (thought daily dosing contributed to constipation), along with daily MVI.  H/o borderline hyperlipidemia:  Improved in the past with dietary changes. High on last check in January.  She tries to follow a lowfat, low cholesterol diet. Doesn't eat much red meat, cheese, eggs.   Component Ref Range & Units 9 mo ago (05/05/21) 1 yr ago (12/03/20) 2 yr ago (09/06/19) 3 yr ago (11/23/18) 3 yr ago (05/09/18)  5 yr ago (12/29/16) 5 yr ago (09/23/16)  Cholesterol, Total 100 - 199 mg/dL 232 High   211 High   190  223 High   206 High   171    Triglycerides 0 - 149 mg/dL 68  83  52  87  62  84  73 R   HDL >39 mg/dL 68  62  63  66  60  46  60 R   VLDL Cholesterol Cal 5 - 40 mg/dL _0 LDL Chol Calc (NIH) 0 - 99 mg/dL 152 High   134 High   117 High        LDL/HDL Ratio 0.0 - 3.2 ratio 2.2   1.9 C         Immunization History  Administered Date(s) Administered   Influenza Split 12/29/2011   Influenza,inj,Quad PF,6+ Mos 01/28/2016, 01/30/2017, 01/31/2019, 03/03/2021   Influenza-Unspecified 02/02/2017, 02/04/2018   PFIZER Comirnaty(Gray Top)Covid-19 Tri-Sucrose Vaccine 11/15/2020   PFIZER(Purple Top)SARS-COV-2 Vaccination 06/23/2019, 07/14/2019   PPD Test 01/19/2012   Pfizer Covid-19 Vaccine Bivalent Booster 31yr & up 07/04/2021   Tdap 01/19/2012   Zoster Recombinat (Shingrix) 12/12/2020, 02/16/2021   Last Pap smear: 10/2017--normal, no high risk HPV present Last mammogram:  10/2021 Last colonoscopy: 11/2018, small internal hemorrhoid. Recheck 10 years Last DEXA: never  Dentist: twice yearly Ophtho:  yearly Exercise:  40 minutes 4-5x/week "walk away the pounds" tape. Not getting much weight-bearing exercise at this time.   PMH, PSH, SH and FH reviewed and updated    ROS:  The patient denies anorexia, fever, decreased hearing, ear pain, sore throat, breast concerns, chest pain, palpitations, dizziness, syncope, dyspnea on exertion, cough, swelling, nausea, vomiting, diarrhea, constipation, abdominal pain, melena, hematochezia, indigestion/heartburn, hematuria, incontinence, dysuria, vaginal bleeding, discharge, odor or itch, genital lesions, joint pains, numbness, tingling, weakness, tremor, suspicious skin lesions, depression, anxiety, abnormal bleeding/bruising, or enlarged lymph nodes.  Hair thinning, unchanged (thinks hereditary vs menopause).  Constipation--controlled  with magnesium supplement every other day Migraine headaches are infrequent Hot flashes, night sweats, tolerable L-sided low back/hip pain ??   PHYSICAL EXAM:  There were no vitals taken for this visit.  Wt Readings from Last 3 Encounters:  08/03/21 198 lb (89.8 kg)  07/06/21 199 lb (90.3 kg)  06/03/21 203 lb (92.1 kg)    General Appearance:      Alert, cooperative, no distress, appears stated age    Head:      Normocephalic, without obvious abnormality, atraumatic    Eyes:      PERRL, conjunctiva/corneas clear, EOM's intact, fundi benign    Ears:      Normal TM's and external ear canals    Nose:     No drainage or sinus tenderness  Throat:     Normal mucosa  Neck:     Supple, no lymphadenopathy;  thyroid: no enlargement/ tenderness/nodules; no carotid bruit or JVD    Back:      Spine nontender, no curvature, ROM normal, no CVA tenderness.   Lungs:       Clear to auscultation bilaterally without wheezes, rales or ronchi; respirations unlabored    Chest Wall:      No tenderness or deformity     Heart:      Regular rate and rhythm, S1 and S2 normal, no murmur, rub or gallop    Breast Exam:      No tenderness, masses, or nipple discharge or inversion.  No axillary lymphadenopathy    Abdomen:       Soft, non-tender, nondistended, normoactive bowel sounds,    no masses, no hepatosplenomegaly    Genitalia:      Normal external genitalia without lesions.  BUS and vagina normal; no cervical motion tenderness. No abnormal vaginal discharge.  Uterus and adnexa not enlarged, no masses or tenderness.  Pap not performed    Rectal:      Normal tone, no masses or tenderness; guaiac negative stool    Extremities:     No clubbing, cyanosis or edema    Pulses:     2+ and symmetric all extremities    Skin:     Skin color, texture, turgor normal, no rashes or lesions    Lymph nodes:     Cervical, supraclavicular, inguinal and axillary nodes normal    Neurologic:     Normal strength, sensation and  gait; reflexes 2+ and symmetric throughout               Psych:   Normal mood, affect, hygiene and grooming   ASSESSMENT/PLAN:  Due for Tdap, COVID and flu (if not gotten elsewhere). Wouldn't give all 3...  C-met, cbc, lipids HIV?  Discussed monthly self breast exams and yearly mammograms; at least 30 minutes of aerobic activity at least 5 days/week, weight-bearing exercise 2-3x/wk; proper sunscreen use reviewed; healthy diet, including goals of  calcium and vitamin D intake and alcohol recommendations (less than or equal to 1 drink/day) reviewed; regular seatbelt use; changing batteries in smoke detectors.  Immunization recommendations discussed--flu shot COVID booster TdaP Colonoscopy recommendations reviewed, UTD. Pap due in 2024.  F/u 1 year, sooner prn for any concerns, or based on abnormal labs.

## 2022-02-24 ENCOUNTER — Ambulatory Visit (INDEPENDENT_AMBULATORY_CARE_PROVIDER_SITE_OTHER): Payer: BC Managed Care – PPO | Admitting: Family Medicine

## 2022-02-24 ENCOUNTER — Encounter: Payer: Self-pay | Admitting: Family Medicine

## 2022-02-24 VITALS — BP 136/86 | HR 64 | Ht 66.0 in | Wt 211.6 lb

## 2022-02-24 DIAGNOSIS — E7800 Pure hypercholesterolemia, unspecified: Secondary | ICD-10-CM

## 2022-02-24 DIAGNOSIS — Z23 Encounter for immunization: Secondary | ICD-10-CM

## 2022-02-24 DIAGNOSIS — Z6379 Other stressful life events affecting family and household: Secondary | ICD-10-CM

## 2022-02-24 DIAGNOSIS — Z0000 Encounter for general adult medical examination without abnormal findings: Secondary | ICD-10-CM | POA: Diagnosis not present

## 2022-02-24 DIAGNOSIS — G43909 Migraine, unspecified, not intractable, without status migrainosus: Secondary | ICD-10-CM | POA: Diagnosis not present

## 2022-02-24 DIAGNOSIS — K5900 Constipation, unspecified: Secondary | ICD-10-CM | POA: Diagnosis not present

## 2022-02-24 DIAGNOSIS — Z6834 Body mass index (BMI) 34.0-34.9, adult: Secondary | ICD-10-CM

## 2022-02-24 DIAGNOSIS — R3129 Other microscopic hematuria: Secondary | ICD-10-CM

## 2022-02-24 LAB — POCT URINALYSIS DIP (PROADVANTAGE DEVICE)
Bilirubin, UA: NEGATIVE
Glucose, UA: NEGATIVE mg/dL
Leukocytes, UA: NEGATIVE
Nitrite, UA: NEGATIVE
Protein Ur, POC: NEGATIVE mg/dL
Specific Gravity, Urine: 1.01
Urobilinogen, Ur: 0.2
pH, UA: 6 (ref 5.0–8.0)

## 2022-02-24 MED ORDER — SUMATRIPTAN SUCCINATE 100 MG PO TABS: ORAL_TABLET | ORAL | 0 refills | Status: DC

## 2022-02-25 LAB — COMPREHENSIVE METABOLIC PANEL
ALT: 9 IU/L (ref 0–32)
AST: 16 IU/L (ref 0–40)
Albumin/Globulin Ratio: 1.4 (ref 1.2–2.2)
Albumin: 4.2 g/dL (ref 3.8–4.9)
Alkaline Phosphatase: 83 IU/L (ref 44–121)
BUN/Creatinine Ratio: 15 (ref 9–23)
BUN: 12 mg/dL (ref 6–24)
Bilirubin Total: 0.5 mg/dL (ref 0.0–1.2)
CO2: 24 mmol/L (ref 20–29)
Calcium: 9.7 mg/dL (ref 8.7–10.2)
Chloride: 103 mmol/L (ref 96–106)
Creatinine, Ser: 0.79 mg/dL (ref 0.57–1.00)
Globulin, Total: 3.1 g/dL (ref 1.5–4.5)
Glucose: 76 mg/dL (ref 70–99)
Potassium: 4.5 mmol/L (ref 3.5–5.2)
Sodium: 140 mmol/L (ref 134–144)
Total Protein: 7.3 g/dL (ref 6.0–8.5)
eGFR: 89 mL/min/{1.73_m2} (ref >59)

## 2022-02-25 LAB — LIPID PANEL
Chol/HDL Ratio: 3.2 ratio (ref 0.0–4.4)
Cholesterol, Total: 205 mg/dL — ABNORMAL HIGH (ref 100–199)
HDL: 65 mg/dL (ref >39)
LDL Chol Calc (NIH): 128 mg/dL — ABNORMAL HIGH (ref 0–99)
Triglycerides: 67 mg/dL (ref 0–149)
VLDL Cholesterol Cal: 12 mg/dL (ref 5–40)

## 2022-02-25 LAB — CBC WITH DIFFERENTIAL/PLATELET
Basophils Absolute: 0 10*3/uL (ref 0.0–0.2)
Basos: 0 %
EOS (ABSOLUTE): 0.1 10*3/uL (ref 0.0–0.4)
Eos: 2 %
Hematocrit: 36.2 % (ref 34.0–46.6)
Hemoglobin: 12.3 g/dL (ref 11.1–15.9)
Immature Grans (Abs): 0 10*3/uL (ref 0.0–0.1)
Immature Granulocytes: 0 %
Lymphocytes Absolute: 1.4 10*3/uL (ref 0.7–3.1)
Lymphs: 24 %
MCH: 31.1 pg (ref 26.6–33.0)
MCHC: 34 g/dL (ref 31.5–35.7)
MCV: 91 fL (ref 79–97)
Monocytes Absolute: 0.5 10*3/uL (ref 0.1–0.9)
Monocytes: 9 %
Neutrophils Absolute: 3.9 10*3/uL (ref 1.4–7.0)
Neutrophils: 65 %
Platelets: 236 10*3/uL (ref 150–450)
RBC: 3.96 x10E6/uL (ref 3.77–5.28)
RDW: 12.5 % (ref 11.7–15.4)
WBC: 6 10*3/uL (ref 3.4–10.8)

## 2022-02-25 LAB — HIV ANTIBODY (ROUTINE TESTING W REFLEX): HIV Screen 4th Generation wRfx: NONREACTIVE

## 2022-04-12 ENCOUNTER — Encounter (INDEPENDENT_AMBULATORY_CARE_PROVIDER_SITE_OTHER): Payer: Self-pay

## 2022-04-12 ENCOUNTER — Ambulatory Visit (INDEPENDENT_AMBULATORY_CARE_PROVIDER_SITE_OTHER): Payer: BC Managed Care – PPO | Admitting: Family Medicine

## 2022-04-12 ENCOUNTER — Encounter (INDEPENDENT_AMBULATORY_CARE_PROVIDER_SITE_OTHER): Payer: Self-pay | Admitting: Family Medicine

## 2022-04-12 VITALS — BP 120/79 | HR 64 | Temp 97.9°F | Ht 66.0 in | Wt 215.4 lb

## 2022-04-12 DIAGNOSIS — Z91199 Patient's noncompliance with other medical treatment and regimen due to unspecified reason: Secondary | ICD-10-CM

## 2022-04-17 ENCOUNTER — Other Ambulatory Visit: Payer: Self-pay | Admitting: Family Medicine

## 2022-04-17 DIAGNOSIS — G43909 Migraine, unspecified, not intractable, without status migrainosus: Secondary | ICD-10-CM

## 2022-05-18 ENCOUNTER — Encounter (INDEPENDENT_AMBULATORY_CARE_PROVIDER_SITE_OTHER): Payer: Self-pay | Admitting: Family Medicine

## 2022-05-18 ENCOUNTER — Ambulatory Visit (INDEPENDENT_AMBULATORY_CARE_PROVIDER_SITE_OTHER): Payer: BC Managed Care – PPO | Admitting: Family Medicine

## 2022-05-18 ENCOUNTER — Encounter (INDEPENDENT_AMBULATORY_CARE_PROVIDER_SITE_OTHER): Payer: Self-pay

## 2022-05-18 VITALS — BP 126/82 | HR 53 | Temp 98.1°F | Ht 66.0 in | Wt 215.4 lb

## 2022-05-18 DIAGNOSIS — R0602 Shortness of breath: Secondary | ICD-10-CM | POA: Insufficient documentation

## 2022-05-18 DIAGNOSIS — E559 Vitamin D deficiency, unspecified: Secondary | ICD-10-CM | POA: Diagnosis not present

## 2022-05-18 DIAGNOSIS — R7303 Prediabetes: Secondary | ICD-10-CM | POA: Diagnosis not present

## 2022-05-18 DIAGNOSIS — E669 Obesity, unspecified: Secondary | ICD-10-CM

## 2022-05-18 DIAGNOSIS — E7849 Other hyperlipidemia: Secondary | ICD-10-CM | POA: Diagnosis not present

## 2022-05-18 DIAGNOSIS — R5383 Other fatigue: Secondary | ICD-10-CM | POA: Diagnosis not present

## 2022-05-18 DIAGNOSIS — Z0289 Encounter for other administrative examinations: Secondary | ICD-10-CM

## 2022-05-18 DIAGNOSIS — Z6834 Body mass index (BMI) 34.0-34.9, adult: Secondary | ICD-10-CM

## 2022-05-18 DIAGNOSIS — G43809 Other migraine, not intractable, without status migrainosus: Secondary | ICD-10-CM

## 2022-05-18 DIAGNOSIS — Z1331 Encounter for screening for depression: Secondary | ICD-10-CM

## 2022-05-18 DIAGNOSIS — F32A Depression, unspecified: Secondary | ICD-10-CM | POA: Diagnosis not present

## 2022-05-19 LAB — VITAMIN B12: Vitamin B-12: 482 pg/mL (ref 232–1245)

## 2022-05-19 LAB — TSH: TSH: 1.91 u[IU]/mL (ref 0.450–4.500)

## 2022-05-19 LAB — HEMOGLOBIN A1C
Est. average glucose Bld gHb Est-mCnc: 117 mg/dL
Hgb A1c MFr Bld: 5.7 % — ABNORMAL HIGH (ref 4.8–5.6)

## 2022-05-19 LAB — T4, FREE: Free T4: 1.2 ng/dL (ref 0.82–1.77)

## 2022-05-19 LAB — VITAMIN D 25 HYDROXY (VIT D DEFICIENCY, FRACTURES): Vit D, 25-Hydroxy: 29 ng/mL — ABNORMAL LOW (ref 30.0–100.0)

## 2022-05-19 LAB — INSULIN, RANDOM: INSULIN: 8.9 u[IU]/mL (ref 2.6–24.9)

## 2022-06-01 ENCOUNTER — Ambulatory Visit (INDEPENDENT_AMBULATORY_CARE_PROVIDER_SITE_OTHER): Payer: BC Managed Care – PPO | Admitting: Family Medicine

## 2022-06-03 ENCOUNTER — Ambulatory Visit (INDEPENDENT_AMBULATORY_CARE_PROVIDER_SITE_OTHER): Payer: BC Managed Care – PPO | Admitting: Family Medicine

## 2022-06-03 ENCOUNTER — Encounter (INDEPENDENT_AMBULATORY_CARE_PROVIDER_SITE_OTHER): Payer: Self-pay | Admitting: Family Medicine

## 2022-06-03 VITALS — BP 112/75 | HR 58 | Temp 98.2°F | Ht 66.0 in | Wt 203.0 lb

## 2022-06-03 DIAGNOSIS — E7849 Other hyperlipidemia: Secondary | ICD-10-CM | POA: Diagnosis not present

## 2022-06-03 DIAGNOSIS — E669 Obesity, unspecified: Secondary | ICD-10-CM | POA: Diagnosis not present

## 2022-06-03 DIAGNOSIS — Z6832 Body mass index (BMI) 32.0-32.9, adult: Secondary | ICD-10-CM

## 2022-06-03 DIAGNOSIS — R7303 Prediabetes: Secondary | ICD-10-CM | POA: Diagnosis not present

## 2022-06-03 DIAGNOSIS — E559 Vitamin D deficiency, unspecified: Secondary | ICD-10-CM | POA: Diagnosis not present

## 2022-06-03 MED ORDER — VITAMIN D (ERGOCALCIFEROL) 1.25 MG (50000 UNIT) PO CAPS: 50000.0000 [IU] | ORAL_CAPSULE | ORAL | 0 refills | Status: DC

## 2022-06-03 NOTE — Progress Notes (Signed)
Chief Complaint:   OBESITY Anita Taylor (MR# [id number]) is a 57 y.o. female who presents for evaluation and treatment of obesity and related comorbidities. Current BMI is Body mass index is 34.77 kg/m. Anita Taylor has been struggling with her weight for many years and has been unsuccessful in either losing weight, maintaining weight loss, or reaching her healthy weight goal.  Anita Taylor is currently in the action stage of change and ready to dedicate time achieving and maintaining a healthier weight. Anita Taylor is interested in becoming our patient and working on intensive lifestyle modifications including (but not limited to) diet and exercise for weight loss.  Anita Taylor's habits were reviewed today and are as follows: Her family eats meals together, she thinks her family will eat healthier with her, her desired weight loss is 25 lbs, she has been heavy most of her life, she started gaining weight in her 20's, her heaviest weight ever was 258 pounds, she has significant food cravings issues, she skips meals frequently, she is frequently drinking liquids with calories, she frequently makes poor food choices, she frequently eats larger portions than normal, and she struggles with emotional eating.  Depression Screen Anita Taylor's Food and Mood (modified PHQ-9) score was 5.  Subjective:   1. Other fatigue Anita Taylor admits to daytime somnolence and denies waking up still tired. Patient has a history of symptoms of N/A. Anita Taylor generally gets about 6-8  hours of sleep per night, and states that she has generally restful sleep. Snoring is present. Apneic episodes are not present. Epworth Sleepiness Score is 4.  Patient will need to decrease snacks and total calories.  2. SOB (shortness of breath) on exertion Anita Taylor notes increasing shortness of breath with exercising and seems to be worsening over time with weight gain. She notes getting out of breath sooner with activity than she used to. This has not gotten worse recently. Anita Taylor  denies shortness of breath at rest or orthopnea.  3. Prediabetes A1c 5.8 on 11/2020.  This is the highest it was.  Patient will need to lower carbs, bread, pasta, chips etc.  4. Other migraine without status migrainosus, not intractable Migraine waxes and wanes.  They are triggered by stress and certain foods 1-2 times per month.  But it takes 3 or more days to get rid of the migraine headache.  5. Vitamin D deficiency Patient is positive for T and tired.  Patient is taking vitamin D 1000 IU OTC daily.  6. Other hyperlipidemia Discussed labs with patient today. FLP on 02/24/2022, LDL 128, HDL 65.  Improved from prior.  Patient is not taking any medication.  Assessment/Plan:   1. Other fatigue Anita Taylor does feel that her weight is causing her energy to be lower than it should be. Fatigue may be related to obesity, depression or many other causes. Labs will be ordered, and in the meanwhile, Anita Taylor will focus on self care including making healthy food choices, increasing physical activity and focusing on stress reduction.  - EKG 12-Lead - T4, free - TSH - Vitamin B12 - VITAMIN D 25 Hydroxy (Vit-D Deficiency, Fractures)  2. SOB (shortness of breath) on exertion [name] does feel that she gets out of breath more easily that she used to when she exercises. Anita Taylor's shortness of breath appears to be obesity related and exercise induced. She has agreed to work on weight loss and gradually increase exercise to treat her exercise induced shortness of breath. Will continue to monitor closely.  3. Prediabetes Check labs today.  Continue PNP, weight loss and continue walking.  - Insulin, random - Hemoglobin A1c - T4, free - TSH - Vitamin B12 - VITAMIN D 25 Hydroxy (Vit-D Deficiency, Fractures)  4. Other migraine without status migrainosus, not intractable Check labs today.  Will consider Topamax in near future as needed if she is having cravings.  5. Vitamin D deficiency Check level today and  continue OTC supplementation.  - Vitamin B12 - VITAMIN D 25 Hydroxy (Vit-D Deficiency, Fractures)  6. Other hyperlipidemia Increase exercise, continue PNP, decrease saturated and trans fat, counseling on labs done today.  7. Depression screening Anita Taylor had a positive depression screening. Depression is commonly associated with obesity and often results in emotional eating behaviors. We will monitor this closely and work on CBT to help improve the non-hunger eating patterns. Referral to Psychology may be required if no improvement is seen as she continues in our clinic.  8. Obesity, current BMI 34.8 Patient will follow-up with Dr. Buelah Manis for her first follow-up.  Anita Taylor is currently in the action stage of change and her goal is to continue with weight loss efforts. I recommend Anita Taylor begin the structured treatment plan as follows:  She has agreed to the Category 2 Plan+ 100 calorie snacks. .  Exercise goals: As is.   Behavioral modification strategies: increasing lean protein intake, decreasing simple carbohydrates, and planning for success.  She was informed of the importance of frequent follow-up visits to maximize her success with intensive lifestyle modifications for her multiple health conditions. She was informed we would discuss her lab results at her next visit unless there is a critical issue that needs to be addressed sooner. Anita Taylor agreed to keep her next visit at the agreed upon time to discuss these results.  Objective:   Blood pressure 126/82, pulse (!) 53, temperature 98.1 F (36.7 C), height  (1.676 m), weight 215 lb 6.4 oz (97.7 kg), SpO2 100 %. Body mass index is 34.77 kg/m.  EKG: Normal sinus rhythm, rate 52 bpm.  Indirect Calorimeter completed today shows a VO2 of 229 and a REE of 1584.  Her calculated basal metabolic rate is 1610 thus her basal metabolic rate is worse than expected.  General: Cooperative, alert, well developed, in no acute distress. HEENT:  Conjunctivae and lids unremarkable. Cardiovascular: Regular rhythm.  Lungs: Normal work of breathing. Neurologic: No focal deficits.   Lab Results  Component Value Date   CREATININE 0.79 02/24/2022   BUN 12 02/24/2022   NA 140 02/24/2022   K 4.5 02/24/2022   CL 103 02/24/2022   CO2 24 02/24/2022   Lab Results  Component Value Date   ALT 9 02/24/2022   AST 16 02/24/2022   ALKPHOS 83 02/24/2022   BILITOT 0.5 02/24/2022   Lab Results  Component Value Date   HGBA1C 5.7 (H) 05/18/2022   HGBA1C 5.6 05/05/2021   HGBA1C 5.8 (H) 12/03/2020   HGBA1C 5.6 09/06/2019   HGBA1C 5.5 05/09/2018   Lab Results  Component Value Date   INSULIN 8.9 05/18/2022   INSULIN 8.4 05/05/2021   INSULIN 15.9 09/06/2019   INSULIN 5.7 05/09/2018   INSULIN 12.7 12/29/2016   Lab Results  Component Value Date   TSH 1.910 05/18/2022   Lab Results  Component Value Date   CHOL 205 (H) 02/24/2022   HDL 65 02/24/2022   LDLCALC 128 (H) 02/24/2022   TRIG 67 02/24/2022   CHOLHDL 3.2 02/24/2022   Lab Results  Component Value Date   WBC 6.0 02/24/2022  HGB 12.3 02/24/2022   HCT 36.2 02/24/2022   MCV 91 02/24/2022   PLT 236 02/24/2022   No results found for: "IRON", "TIBC", "FERRITIN"  Attestation Statements:   Reviewed by clinician on day of visit: allergies, medications, problem list, medical history, surgical history, family history, social history, and previous encounter notes.  I, Malcolm Metro, RMA, am acting as Energy manager for [employer], DO.  I have reviewed the above documentation and I agree with the above. Carlye Grippe, D.O.  The 21st Century Cures Act was signed into law in 2016 which includes the topic of electronic health records.  This provides immediate access to information in MyChart.  This includes consultation notes, operative notes, office notes, lab results and pathology reports.  If you have any questions about what you read please let us know at your next  visit so we can discuss your concerns and take corrective action if need be.  We are right here with you.

## 2022-06-03 NOTE — Progress Notes (Deleted)
Patient has been trying to follow the meal plan and exercise and make healthier choices. Her life has had lots of challenges and changes.  She is deciding to focus on herself.  The meal plan works for her.  She is familiar with it and sometimes she struggles to get all the food in.  Sometimes she will add in milk or almonds to increase protein without having to do all the meat. She was doing the walking 30 minutes 3-4x a week and she is trying to be consistent with the walks.  Next few weeks she has no events, no activities or travel.  May go out to dinner for Valentine's Day.  Does not feel like she needs to make any changes to the meal plan for the next few weeks.

## 2022-06-15 ENCOUNTER — Ambulatory Visit (INDEPENDENT_AMBULATORY_CARE_PROVIDER_SITE_OTHER): Payer: BC Managed Care – PPO | Admitting: Family Medicine

## 2022-06-17 ENCOUNTER — Ambulatory Visit (INDEPENDENT_AMBULATORY_CARE_PROVIDER_SITE_OTHER): Payer: BC Managed Care – PPO | Admitting: Family Medicine

## 2022-06-17 NOTE — Progress Notes (Signed)
Chief Complaint:   OBESITY Anita Taylor is here to discuss her progress with her obesity treatment plan along with follow-up of her obesity related diagnoses. [name] is on the Category 2 Plan and states she is following her eating plan approximately 95% of the time. [name] states she is walking 30 minutes 3-4 times per week.  Today's visit was #: 2 Starting weight: 215 lbs Starting date: 05/18/2022 Today's weight: 203 lbs Today's date: 06/03/2022 Total lbs lost to date: 12 lbs Total lbs lost since last in-office visit: 12  Interim History: Anita Taylor has been trying to follow the meal plan and exercise and make healthier choices. Her life has had lots of challenges and changes.  She is deciding to focus on herself.  The meal plan works for her.  She is familiar with it and sometimes she struggles to get all the food in.  Sometimes she will add in milk or almonds to increase protein without having to do all the meat. She was doing the walking 30 minutes 3-4x a week and she is trying to be consistent with the walks.  Next few weeks she has no events, no activities or travel.  May go out to dinner for Valentine's Day.  Does not feel like she needs to make any changes to the meal plan for the next few weeks.  Subjective:   1. Vitamin D deficiency Vit D level of 29.0.  Notes fatigue.  On 2K IU daily (1K from MV)  2. Other hyperlipidemia LDL of 128, HDL of 65, Trigly of 67.  No history of hypertension.  10 year ASCVD risk of 1.6% (at optimal level).  3. Prediabetes A1c 5.7, insulin 8.9.  Not on medication.  Assessment/Plan:   1. Vitamin D deficiency We will refill Vit D 50K IU once a week for 1 month with 0 refills.  -Refill Vitamin D, Ergocalciferol, (DRISDOL) 1.25 MG (50000 UNIT) CAPS capsule; Take 1 capsule (50,000 Units total) by mouth every 7 (seven) days.  Dispense: 4 capsule; Refill: 0  2. Other hyperlipidemia Continue Cat 2 without changes in plan.  3. Prediabetes Pathophysiology of  Insulin Resistance, Prediabetes and diabetes discussed today.  No medications at this time continue Cat 2.  4. Obesity, current BMI 34.8  5. Obesity with current BMI of 32.8 [name] is currently in the action stage of change. As such, her goal is to continue with weight loss efforts. She has agreed to the Category 2 Plan +100.   Exercise goals: All adults should avoid inactivity. Some physical activity is better than none, and adults who participate in any amount of physical activity gain some health benefits.  Behavioral modification strategies: increasing lean protein intake, meal planning and cooking strategies, keeping healthy foods in the home, better snacking choices, and planning for success.  Anita Taylor has agreed to follow-up with our clinic in 3 weeks. She was informed of the importance of frequent follow-up visits to maximize her success with intensive lifestyle modifications for her multiple health conditions.   Objective:   Blood pressure 112/75, pulse (!) 58, temperature 98.2 F (36.8 C), height '5\' 6"'$  (1.676 m), weight 203 lb (92.1 kg), SpO2 100 %. Body mass index is 32.77 kg/m.  General: Cooperative, alert, well developed, in no acute distress. HEENT: Conjunctivae and lids unremarkable. Cardiovascular: Regular rhythm.  Lungs: Normal work of breathing. Neurologic: No focal deficits.   Lab Results  Component Value Date   CREATININE 0.79 02/24/2022   BUN 12 02/24/2022   NA  140 02/24/2022   K 4.5 02/24/2022   CL 103 02/24/2022   CO2 24 02/24/2022   Lab Results  Component Value Date   ALT 9 02/24/2022   AST 16 02/24/2022   ALKPHOS 83 02/24/2022   BILITOT 0.5 02/24/2022   Lab Results  Component Value Date   HGBA1C 5.7 (H) 05/18/2022   HGBA1C 5.6 05/05/2021   HGBA1C 5.8 (H) 12/03/2020   HGBA1C 5.6 09/06/2019   HGBA1C 5.5 05/09/2018   Lab Results  Component Value Date   INSULIN 8.9 05/18/2022   INSULIN 8.4 05/05/2021   INSULIN 15.9 09/06/2019   INSULIN 5.7  05/09/2018   INSULIN 12.7 12/29/2016   Lab Results  Component Value Date   TSH 1.910 05/18/2022   Lab Results  Component Value Date   CHOL 205 (H) 02/24/2022   HDL 65 02/24/2022   LDLCALC 128 (H) 02/24/2022   TRIG 67 02/24/2022   CHOLHDL 3.2 02/24/2022   Lab Results  Component Value Date   VD25OH 29.0 (L) 05/18/2022   VD25OH 49.0 05/05/2021   VD25OH 43.3 12/03/2020   Lab Results  Component Value Date   WBC 6.0 02/24/2022   HGB 12.3 02/24/2022   HCT 36.2 02/24/2022   MCV 91 02/24/2022   PLT 236 02/24/2022   No results found for: "IRON", "TIBC", "FERRITIN"  Attestation Statements:   Reviewed by clinician on day of visit: allergies, medications, problem list, medical history, surgical history, family history, social history, and previous encounter notes.  Time spent on visit including pre-visit chart review and post-visit care and charting was 45 minutes.   I, Elnora Morrison, RMA am acting as transcriptionist for Coralie Common, MD.  I have reviewed the above documentation for accuracy and completeness, and I agree with the above. - Coralie Common, MD

## 2022-06-28 NOTE — Progress Notes (Signed)
Not seen

## 2022-07-01 ENCOUNTER — Ambulatory Visit (INDEPENDENT_AMBULATORY_CARE_PROVIDER_SITE_OTHER): Payer: BC Managed Care – PPO | Admitting: Family Medicine

## 2022-07-06 ENCOUNTER — Telehealth: Payer: Self-pay | Admitting: Family Medicine

## 2022-07-06 DIAGNOSIS — G43909 Migraine, unspecified, not intractable, without status migrainosus: Secondary | ICD-10-CM

## 2022-07-06 MED ORDER — SUMATRIPTAN SUCCINATE 100 MG PO TABS: ORAL_TABLET | ORAL | 0 refills | Status: DC

## 2022-07-06 NOTE — Telephone Encounter (Signed)
Anita Taylor requesting a refill on sumatriptan to [id number] [place] - [id number]

## 2022-07-06 NOTE — Telephone Encounter (Signed)
Chart reviewed, last visit 02/2022, last RF 03/2022

## 2022-07-06 NOTE — Telephone Encounter (Signed)
Forwarding to you :)

## 2022-07-15 ENCOUNTER — Telehealth (INDEPENDENT_AMBULATORY_CARE_PROVIDER_SITE_OTHER): Payer: Self-pay | Admitting: Family Medicine

## 2022-07-15 NOTE — Telephone Encounter (Signed)
Left patient vm to give me a call back.    Patients new patient packet has not shown up in chart.    We need to get a new packet completed.

## 2022-07-20 NOTE — Telephone Encounter (Signed)
Patient had left message.   I have sent charge correction an email to reverse $50.00 no show fee charged [facility] 04/12/2022.  I have also left patient vm to give me a call back in regard to new patient packet.  Once we have received a completed packet to attach to visit DOS 05/18/22, we can then have the $25.00 copay paid DOS 04/12/22 transferred to the [facility] 05/18/22.

## 2022-07-20 NOTE — Telephone Encounter (Signed)
Please give patient a call back

## 2022-08-06 NOTE — Telephone Encounter (Signed)
Mailing patient new patient packet on 4/15 to complete.

## 2022-08-18 NOTE — Telephone Encounter (Signed)
Patient has dropped packet off.  I have given to Jasmine December to be scribed.

## 2022-09-22 ENCOUNTER — Other Ambulatory Visit: Payer: Self-pay | Admitting: Family Medicine

## 2022-09-22 DIAGNOSIS — Z0000 Encounter for general adult medical examination without abnormal findings: Secondary | ICD-10-CM

## 2022-10-07 ENCOUNTER — Ambulatory Visit (INDEPENDENT_AMBULATORY_CARE_PROVIDER_SITE_OTHER): Payer: BC Managed Care – PPO | Admitting: Family Medicine

## 2022-10-07 ENCOUNTER — Encounter (INDEPENDENT_AMBULATORY_CARE_PROVIDER_SITE_OTHER): Payer: Self-pay | Admitting: Family Medicine

## 2022-10-07 VITALS — BP 92/65 | HR 54 | Temp 98.6°F | Ht 66.0 in | Wt 198.0 lb

## 2022-10-07 DIAGNOSIS — E7849 Other hyperlipidemia: Secondary | ICD-10-CM

## 2022-10-07 DIAGNOSIS — E559 Vitamin D deficiency, unspecified: Secondary | ICD-10-CM | POA: Diagnosis not present

## 2022-10-07 DIAGNOSIS — Z6831 Body mass index (BMI) 31.0-31.9, adult: Secondary | ICD-10-CM

## 2022-10-07 DIAGNOSIS — G43909 Migraine, unspecified, not intractable, without status migrainosus: Secondary | ICD-10-CM | POA: Diagnosis not present

## 2022-10-07 DIAGNOSIS — E6609 Other obesity due to excess calories: Secondary | ICD-10-CM

## 2022-10-07 DIAGNOSIS — R7303 Prediabetes: Secondary | ICD-10-CM | POA: Diagnosis not present

## 2022-10-07 MED ORDER — SUMATRIPTAN SUCCINATE 100 MG PO TABS: ORAL_TABLET | ORAL | 0 refills | Status: DC

## 2022-10-07 MED ORDER — VITAMIN D (ERGOCALCIFEROL) 1.25 MG (50000 UNIT) PO CAPS: 50000.0000 [IU] | ORAL_CAPSULE | ORAL | 0 refills | Status: DC

## 2022-10-07 MED ORDER — TOPIRAMATE 50 MG PO TABS
50.0000 mg | ORAL_TABLET | Freq: Two times a day (BID) | ORAL | 0 refills | Status: DC
Start: 2022-10-07 — End: 2022-11-18

## 2022-10-07 NOTE — Progress Notes (Signed)
Anita Taylor, D.O.  ABFM, ABOM Specializing in Clinical Bariatric Medicine  Office located at: [address] [place], [place]  [address]     Assessment and Plan:   Medications Discontinued During This Encounter  Medication Reason   Vitamin D, Ergocalciferol, (DRISDOL) 1.25 MG (50000 UNIT) CAPS capsule Reorder   SUMAtriptan (IMITREX) 100 MG tablet Reorder     Meds ordered this encounter  Medications   Vitamin D, Ergocalciferol, (DRISDOL) 1.25 MG (50000 UNIT) CAPS capsule    Sig: Take 1 capsule (50,000 Units total) by mouth every 7 (seven) days.    Dispense:  4 capsule    Refill:  0   SUMAtriptan (IMITREX) 100 MG tablet    Sig: May repeat in 2 hours if headache persists or recurs.    Dispense:  10 tablet    Refill:  0   topiramate (TOPAMAX) 50 MG tablet    Sig: Take 1 tablet (50 mg total) by mouth 2 (two) times daily.    Dispense:  60 tablet    Refill:  0     Prediabetes Assessment: Condition is diet controlled and not optimized.  Lab Results  Component Value Date   HGBA1C 5.7 (H) 05/18/2022   HGBA1C 5.6 05/05/2021   HGBA1C 5.8 (H) 12/03/2020   INSULIN 8.9 05/18/2022   INSULIN 8.4 05/05/2021   INSULIN 15.9 09/06/2019    Plan: Sascha will continue to work on weight loss, exercise, via their meal plan we devised to help decrease the risk of progressing to diabetes. We will recheck A1c and fasting insulin level in approximately 3 months from last check, or as deemed appropriate.    Vitamin D deficiency Assessment: Condition is not optimized. Patient has not been taking Ergocalciferol 50K IU weekly She has lost to follow up. However, She has been taking OTC Cholecalciferol 1,000 Units daily. Denies any adverse effects.  Lab Results  Component Value Date   VD25OH 29.0 (L) 05/18/2022   VD25OH 49.0 05/05/2021   VD25OH 43.3 12/03/2020   Plan: Restart Ergocalciferol 50K IU. Will reorder this today. We will continue to  monitor levels regularly (every 3-4 mo on  average) to keep levels within normal limits and prevent over supplementation.   Migraine without status migrainosus, not intractable, unspecified migraine type Assessment: Anita Taylor reports that her headaches are mostly controlled. She typically has headaches once a month. She takes Sumatriptan 100 mg prn. Pt endorses that her cravings for carbohydrates have not been well controlled.She has tried Topamax in the past and went off it. She tolerated the medicine well.   Plan: Continue with Sumatriptan. Will refill this today.  Begin Topamax. Patient has been instructed to start with 1/2 tab at night for 4 days, if tolerating well she can increase to 1/2 tab twice daily for 3-4 days, and then 1/2 in am and 1 tab at night. Discussed anticipated benefits and possible risks of starting Topamax. Will continue to monitor condition closely.    Other hyperlipidemia Assessment: Condition is not optimized. This condition is diet/exercise controlled. Anita Taylor is not taking any cholesterol medication.   Lab Results  Component Value Date   CHOL 205 (H) 02/24/2022   HDL 65 02/24/2022   LDLCALC 128 (H) 02/24/2022   TRIG 67 02/24/2022   CHOLHDL 3.2 02/24/2022   Plan: She agrees to continue with our treatment plan that is low in saturated and trans fats, and low in fatty carbs to improve these numbers. We will continue routine screening as  patient continues to achieve health goals along their weight loss journey    TREATMENT PLAN FOR OBESITY: Class 1 obesity due to excess calories with serious comorbidity and body mass index (BMI) of 34.0 to 34.9 in adult Assessment: Anita Taylor is here to discuss her progress with her obesity treatment plan along with follow-up of her obesity related diagnoses. See Medical Weight Management Flowsheet for complete bioelectrical impedance results.  Condition is improving. Biometric data collected today, was reviewed with patient.   Since last office visit on  06/03/22 patient's  Muscle mass has increased by 2.6 lb. Fat mass has decreased by 7.8 lb. Total body water has decreased by 1.8 lb.  Counseling done on how various foods will affect these numbers and how to maximize success  Total lbs lost to date: 17 Total weight loss percentage to date: 7.91   Plan: Begin the Portion Control and Smart Choices meal plan.   Behavioral Intervention Additional resources provided today: Portion Midwife Handout Evidence-based interventions for health behavior change were utilized today including the discussion of self monitoring techniques, problem-solving barriers and SMART goal setting techniques.   Regarding patient's less desirable eating habits and patterns, we employed the technique of small changes.  Pt will specifically work on: beginning prescribed meal plan, trying 4-7-8 breathing technique on the Calm App 5-10 minutes daily, and explore Alzheimer support groups  for next visit.    Recommended Physical Activity Goals  Jenniyah has been advised to slowly work up to 150 minutes of moderate intensity aerobic activity a week and strengthening exercises 2-3 times per week for cardiovascular health, weight loss maintenance and preservation of muscle mass.   She has agreed to Continue current level of physical activity   FOLLOW UP: Return in 3-5 wks. She was informed of the importance of frequent follow up visits to maximize her success with intensive lifestyle modifications for her multiple health conditions.   Subjective:   Chief complaint: Obesity Zachery is here to discuss her progress with her obesity treatment plan. She is on the the Category 2 Plan and states she is following her eating plan approximately 95% of the time. She states she is walking 45 minutes 4-5 days per week.  Interval History:  Anita Taylor is here for a follow up office visit.   Since last office visit on 06/03/2022 with Dr.Ukleja  - Anita Taylor endorses that life has  been stressful. Her husband was recently diagnosed with early stages of Alzheimer's.   - For stress management, she walks, reads, and does meditation/prayer.    - She has not really been following the Category 2 meal plan, however in the past 3 wks she has been working on eating more vegetables, fruits, proteins, and high antioxidant foods.   Review of Systems:  Pertinent positives were addressed with patient today.  Reviewed by clinician on day of visit: allergies, medications, problem list, medical history, surgical history, family history, social history, and previous encounter notes.  Weight Summary and Biometrics   Weight Lost Since Last Visit: 5lb  Weight Gained Since Last Visit: 0    Vitals Temp: 98.6 F (37 C) BP: 92/65 Pulse Rate: (!) 54 SpO2: 98 %   Anthropometric Measurements Height: 5\' 6"  (1.676 m) Weight: 198 lb (89.8 kg) BMI (Calculated): 31.97 Weight at Last Visit: 203lb Weight Lost Since Last Visit: 5lb Weight Gained Since Last Visit: 0 Starting Weight: 215 Total Weight Loss (lbs): 17 lb (7.711 kg)   Body Composition  Body Fat %: 40.9 % Fat Mass (lbs): 81 lbs Muscle Mass (lbs): 111.2 lbs Total Body Water (lbs): 77 lbs Visceral Fat Rating : 10   Other Clinical Data Today's Visit #: 3 Starting Date: 05/18/22   Objective:   PHYSICAL EXAM: Blood pressure 92/65, pulse (!) 54, temperature 98.6 F (37 C), height 5\' 6"  (1.676 m), weight 198 lb (89.8 kg), SpO2 98 %. Body mass index is 31.96 kg/m.  General: Well Developed, well nourished, and in no acute distress.  HEENT: Normocephalic, atraumatic Skin: Warm and dry, cap RF less 2 sec, good turgor Chest:  Normal excursion, shape, no gross abn Respiratory: speaking in full sentences, no conversational dyspnea NeuroM-Sk: Ambulates w/o assistance, moves * 4 Psych: A and O *3, insight good, mood-full  DIAGNOSTIC DATA REVIEWED:  BMET    Component Value Date/Time   NA 140 02/24/2022 1510   K 4.5  02/24/2022 1510   CL 103 02/24/2022 1510   CO2 24 02/24/2022 1510   GLUCOSE 76 02/24/2022 1510   GLUCOSE 82 09/18/2015 0903   BUN 12 02/24/2022 1510   CREATININE 0.79 02/24/2022 1510   CREATININE 0.70 09/18/2015 0903   CALCIUM 9.7 02/24/2022 1510   GFRNONAA 93 09/06/2019 1148   GFRAA 108 09/06/2019 1148   Lab Results  Component Value Date   HGBA1C 5.7 (H) 05/18/2022   HGBA1C 5.6 12/29/2016   Lab Results  Component Value Date   INSULIN 8.9 05/18/2022   INSULIN 12.7 12/29/2016   Lab Results  Component Value Date   TSH 1.910 05/18/2022   CBC    Component Value Date/Time   WBC 6.0 02/24/2022 1510   WBC 6.3 09/18/2015 0903   RBC 3.96 02/24/2022 1510   RBC 4.05 09/18/2015 0903   HGB 12.3 02/24/2022 1510   HCT 36.2 02/24/2022 1510   PLT 236 02/24/2022 1510   MCV 91 02/24/2022 1510   MCH 31.1 02/24/2022 1510   MCH 30.9 09/18/2015 0903   MCHC 34.0 02/24/2022 1510   MCHC 33.2 09/18/2015 0903   RDW 12.5 02/24/2022 1510   Iron Studies No results found for: "IRON", "TIBC", "FERRITIN", "IRONPCTSAT" Lipid Panel     Component Value Date/Time   CHOL 205 (H) 02/24/2022 1510   TRIG 67 02/24/2022 1510   HDL 65 02/24/2022 1510   CHOLHDL 3.2 02/24/2022 1510   CHOLHDL 3.5 09/23/2016 0927   VLDL 15 09/23/2016 0927   LDLCALC 128 (H) 02/24/2022 1510   Hepatic Function Panel     Component Value Date/Time   PROT 7.3 02/24/2022 1510   ALBUMIN 4.2 02/24/2022 1510   AST 16 02/24/2022 1510   ALT 9 02/24/2022 1510   ALKPHOS 83 02/24/2022 1510   BILITOT 0.5 02/24/2022 1510      Component Value Date/Time   TSH 1.910 05/18/2022 0902   Nutritional Lab Results  Component Value Date   VD25OH 29.0 (L) 05/18/2022   VD25OH 49.0 05/05/2021   VD25OH 43.3 12/03/2020    Attestations:  Patient was in the office today and time spent on visit including pre-visit chart review and post-visit care/coordination of care and electronic medical record documentation was 45 minutes. 50% of the  time was in face to face counseling of this patient's medical condition(s) and providing education on treatment options to include the first-line treatment of diet and lifestyle modification.    I, Special Randolm Idol, acting as a Stage manager for Thomasene Lot, DO., have compiled all relevant documentation for today's office visit on behalf of Thomasene Lot,  DO, while in the presence of Thomasene Lot, DO.  I have reviewed the above documentation for accuracy and completeness, and I agree with the above. Anita Taylor, D.O.  The 21st Century Cures Act was signed into law in 2016 which includes the topic of electronic health records.  This provides immediate access to information in MyChart.  This includes consultation notes, operative notes, office notes, lab results and pathology reports.  If you have any questions about what you read please let us know at your next visit so we can discuss your concerns and take corrective action if need be.  We are right here with you.

## 2022-10-27 ENCOUNTER — Other Ambulatory Visit (INDEPENDENT_AMBULATORY_CARE_PROVIDER_SITE_OTHER): Payer: Self-pay | Admitting: Family Medicine

## 2022-10-27 DIAGNOSIS — E559 Vitamin D deficiency, unspecified: Secondary | ICD-10-CM

## 2022-10-29 ENCOUNTER — Other Ambulatory Visit (INDEPENDENT_AMBULATORY_CARE_PROVIDER_SITE_OTHER): Payer: Self-pay | Admitting: Family Medicine

## 2022-10-29 DIAGNOSIS — G43909 Migraine, unspecified, not intractable, without status migrainosus: Secondary | ICD-10-CM

## 2022-11-08 ENCOUNTER — Ambulatory Visit
Admission: RE | Admit: 2022-11-08 | Discharge: 2022-11-08 | Disposition: A | Discharge status: Home / Self Care | Payer: BC Managed Care – PPO | Source: Ambulatory Visit | Attending: Family Medicine | Admitting: Family Medicine

## 2022-11-08 DIAGNOSIS — Z0000 Encounter for general adult medical examination without abnormal findings: Secondary | ICD-10-CM

## 2022-11-15 ENCOUNTER — Ambulatory Visit (INDEPENDENT_AMBULATORY_CARE_PROVIDER_SITE_OTHER): Payer: BC Managed Care – PPO | Admitting: Family Medicine

## 2022-11-18 ENCOUNTER — Encounter (INDEPENDENT_AMBULATORY_CARE_PROVIDER_SITE_OTHER): Payer: Self-pay | Admitting: Family Medicine

## 2022-11-18 ENCOUNTER — Ambulatory Visit (INDEPENDENT_AMBULATORY_CARE_PROVIDER_SITE_OTHER): Payer: BC Managed Care – PPO | Admitting: Family Medicine

## 2022-11-18 VITALS — BP 113/80 | HR 63 | Temp 98.2°F | Ht 66.0 in | Wt 185.0 lb

## 2022-11-18 DIAGNOSIS — E559 Vitamin D deficiency, unspecified: Secondary | ICD-10-CM

## 2022-11-18 DIAGNOSIS — Z6834 Body mass index (BMI) 34.0-34.9, adult: Secondary | ICD-10-CM | POA: Diagnosis not present

## 2022-11-18 DIAGNOSIS — G43909 Migraine, unspecified, not intractable, without status migrainosus: Secondary | ICD-10-CM

## 2022-11-18 DIAGNOSIS — E6609 Other obesity due to excess calories: Secondary | ICD-10-CM

## 2022-11-18 DIAGNOSIS — E669 Obesity, unspecified: Secondary | ICD-10-CM

## 2022-11-18 MED ORDER — VITAMIN D (ERGOCALCIFEROL) 1.25 MG (50000 UNIT) PO CAPS
50000.0000 [IU] | ORAL_CAPSULE | ORAL | 0 refills | Status: DC
Start: 2022-11-18 — End: 2023-06-16

## 2022-11-18 NOTE — Progress Notes (Signed)
Carlye Grippe, D.O.  ABFM, ABOM Specializing in Clinical Bariatric Medicine  Office located at: [address] [place], [place]  [address]     Assessment and Plan:   Medications Discontinued During This Encounter  Medication Reason   ASHWAGANDHA PO    topiramate (TOPAMAX) 50 MG tablet Patient Preference   Vitamin D, Ergocalciferol, (DRISDOL) 1.25 MG (50000 UNIT) CAPS capsule Reorder     Meds ordered this encounter  Medications   Vitamin D, Ergocalciferol, (DRISDOL) 1.25 MG (50000 UNIT) CAPS capsule    Sig: Take 1 capsule (50,000 Units total) by mouth every 7 (seven) days.    Dispense:  4 capsule    Refill:  0    Check fasting labs next OV (A1c, Insulin, FLP, Vitamin D, B12, and CMP)    Migraine without status migrainosus, not intractable, unspecified migraine type Assessment & Plan Endorses that she discontinued Topamax because she experienced side effects: a "dull type of headache". Feels that hunger and cravings are pretty well controlled.   Continue her prudent nutritional plan that is low in simple carbohydrates, saturated fats and trans fats to goal of 5-10% weight loss to achieve significant health benefits.  Pt encouraged to continually advance exercise and cardiovascular fitness as tolerated throughout weight loss journey. Will continue to monitor condition as it relates to their weight loss journey    Vitamin D deficiency Assessment & Plan: Pt endorses taking ERGO 50 K lU once weekly and OTC Vitamin D 1,000 units daily. Tolerating both supplements well - no concerns today.   Lab Results  Component Value Date   VD25OH 29.0 (L) 05/18/2022   VD25OH 49.0 05/05/2021   VD25OH 43.3 12/03/2020   Continue with OTC Vitamin D and ERGO at current doses. Will refill ERGO today. Will recheck Vitamin D levels next OV.    TREATMENT PLAN FOR OBESITY: Obesity, current BMI 29.87 Class 1 obesity due to excess calories with serious comorbidity and body mass index  (BMI) of 34.0 to 34.9 in adult Assessment & Plan:  Toneisha Savary Lynne is here to discuss her progress with her obesity treatment plan along with follow-up of her obesity related diagnoses. See Medical Weight Management Flowsheet for complete bioelectrical impedance results.  Condition is not optimized. Biometric data collected today, was reviewed with patient.   Since last office visit on 10/07/22 patient's  Muscle mass has decreased by 8.4 lb. Fat mass has decreased by 3.6 lb. Total body water has increased by 1.8 lb.  Counseling done on how various foods will affect these numbers and how to maximize success  Total lbs lost to date: 30 lbs  Total weight loss percentage to date: 13.95%    Reminded pt that having adequate protein with each meal is important for  controlling hunger and cravings and improving metabolism. Reviewed both meal plans and showed pt different ways to increase her lean protein intake. Continue to follow either the Portion Control Smart Choices Plan or the Category 2 meal plan with only 100 snack calories.   Behavioral Intervention Additional resources provided today: category 2 meal plan information and Protein Equivalence Handout Evidence-based interventions for health behavior change were utilized today including the discussion of self monitoring techniques, problem-solving barriers and SMART goal setting techniques.   Regarding patient's less desirable eating habits and patterns, we employed the technique of small changes.  Pt will specifically work on: increasing her lean protein intake and varying walking intensity (going uphill/downhill and/or altering speed) for next visit.  Recommended Physical Activity Goals Davene has been advised to slowly work up to 150 minutes of moderate intensity aerobic activity a week and strengthening exercises 2-3 times per week for cardiovascular health, weight loss maintenance and preservation of muscle mass. She has agreed to Continue  current level of physical activity   FOLLOW UP: Return in 3-4 wks for fasting labs. She was informed of the importance of frequent follow up visits to maximize her success with intensive lifestyle modifications for her multiple health conditions.  Subjective:   Chief complaint: Obesity Aesha is here to discuss her progress with her obesity treatment plan. She is on practicing portion control and making smarter food choices, such as increasing vegetables and decreasing simple carbohydrates and states she is following her eating plan approximately 90-95% of the time. She states she is walking 2 miles 30-40 minutes 6 days per week.  Interval History:  Monisha Siebel Hislop is here for a follow up office visit. Lianne endorses being on vacation and traveling for her job for a few weeks since last OV. Typically, eats 6-8 ounces lean protein at lunch and 2-4 ounces for dinner. Sometimes will eat egg bites at breakfast. Reports drinking ~ 1 gallon of water daily. Hunger and cravings pretty well controlled.   Review of Systems:  Pertinent positives were addressed with patient today.  Reviewed by clinician on day of visit: allergies, medications, problem list, medical history, surgical history, family history, social history, and previous encounter notes.  Weight Summary and Biometrics   Weight Lost Since Last Visit: 13lb  Weight Gained Since Last Visit: 0   Vitals Temp: 98.2 F (36.8 C) BP: 113/80 Pulse Rate: 63 SpO2: 100 %   Anthropometric Measurements Height: 5\' 6"  (1.676 m) Weight: 185 lb (83.9 kg) BMI (Calculated): 29.87 Weight at Last Visit: 198lb Weight Lost Since Last Visit: 13lb Weight Gained Since Last Visit: 0 Starting Weight: 215lb Total Weight Loss (lbs): 30 lb (13.6 kg)   Body Composition  Body Fat %: 41.7 % Fat Mass (lbs): 77.4 lbs Muscle Mass (lbs): 102.8 lbs Total Body Water (lbs): 78.8 lbs Visceral Fat Rating : 10   Other Clinical Data Fasting: no Labs:  no Today's Visit #: 4 Starting Date: 05/18/22   Objective:   PHYSICAL EXAM: Blood pressure 113/80, pulse 63, temperature 98.2 F (36.8 C), height 5\' 6"  (1.676 m), weight 185 lb (83.9 kg), SpO2 100%. Body mass index is 29.86 kg/m.  General: Well Developed, well nourished, and in no acute distress.  HEENT: Normocephalic, atraumatic Skin: Warm and dry, cap RF less 2 sec, good turgor Chest:  Normal excursion, shape, no gross abn Respiratory: speaking in full sentences, no conversational dyspnea NeuroM-Sk: Ambulates w/o assistance, moves * 4 Psych: A and O *3, insight good, mood-full  DIAGNOSTIC DATA REVIEWED:  BMET    Component Value Date/Time   NA 140 02/24/2022 1510   K 4.5 02/24/2022 1510   CL 103 02/24/2022 1510   CO2 24 02/24/2022 1510   GLUCOSE 76 02/24/2022 1510   GLUCOSE 82 09/18/2015 0903   BUN 12 02/24/2022 1510   CREATININE 0.79 02/24/2022 1510   CREATININE 0.70 09/18/2015 0903   CALCIUM 9.7 02/24/2022 1510   GFRNONAA 93 09/06/2019 1148   GFRAA 108 09/06/2019 1148   Lab Results  Component Value Date   HGBA1C 5.7 (H) 05/18/2022   HGBA1C 5.6 12/29/2016   Lab Results  Component Value Date   INSULIN 8.9 05/18/2022   INSULIN 12.7 12/29/2016   Lab Results  Component Value Date   TSH 1.910 05/18/2022   CBC    Component Value Date/Time   WBC 6.0 02/24/2022 1510   WBC 6.3 09/18/2015 0903   RBC 3.96 02/24/2022 1510   RBC 4.05 09/18/2015 0903   HGB 12.3 02/24/2022 1510   HCT 36.2 02/24/2022 1510   PLT 236 02/24/2022 1510   MCV 91 02/24/2022 1510   MCH 31.1 02/24/2022 1510   MCH 30.9 09/18/2015 0903   MCHC 34.0 02/24/2022 1510   MCHC 33.2 09/18/2015 0903   RDW 12.5 02/24/2022 1510   Iron Studies No results found for: "IRON", "TIBC", "FERRITIN", "IRONPCTSAT" Lipid Panel     Component Value Date/Time   CHOL 205 (H) 02/24/2022 1510   TRIG 67 02/24/2022 1510   HDL 65 02/24/2022 1510   CHOLHDL 3.2 02/24/2022 1510   CHOLHDL 3.5 09/23/2016 0927    VLDL 15 09/23/2016 0927   LDLCALC 128 (H) 02/24/2022 1510   Hepatic Function Panel     Component Value Date/Time   PROT 7.3 02/24/2022 1510   ALBUMIN 4.2 02/24/2022 1510   AST 16 02/24/2022 1510   ALT 9 02/24/2022 1510   ALKPHOS 83 02/24/2022 1510   BILITOT 0.5 02/24/2022 1510      Component Value Date/Time   TSH 1.910 05/18/2022 0902   Nutritional Lab Results  Component Value Date   VD25OH 29.0 (L) 05/18/2022   VD25OH 49.0 05/05/2021   VD25OH 43.3 12/03/2020    Attestations:   I, Special Puri , acting as a Stage manager for Thomasene Lot, DO., have compiled all relevant documentation for today's office visit on behalf of Thomasene Lot, DO, while in the presence of [employer], DO.  I have reviewed the above documentation for accuracy and completeness, and I agree with the above. Carlye Grippe, D.O.  The 21st Century Cures Act was signed into law in 2016 which includes the topic of electronic health records.  This provides immediate access to information in MyChart.  This includes consultation notes, operative notes, office notes, lab results and pathology reports.  If you have any questions about what you read please let us know at your next visit so we can discuss your concerns and take corrective action if need be.  We are right here with you.

## 2022-12-23 ENCOUNTER — Ambulatory Visit (INDEPENDENT_AMBULATORY_CARE_PROVIDER_SITE_OTHER): Payer: BC Managed Care – PPO | Admitting: Family Medicine

## 2023-03-03 ENCOUNTER — Telehealth (INDEPENDENT_AMBULATORY_CARE_PROVIDER_SITE_OTHER): Payer: Self-pay

## 2023-03-03 NOTE — Telephone Encounter (Signed)
Patient called requesting to speak with the Practice Admin concerning an ongoing billing issue. The patient states that she has received a bill for $25.00 for a co-pay from 04/2022. Pt states that she already paid this amount and had been in conversation with Tammy about this earlier this year. I gave the patient the numbers to the billing department, but patient has requested a call back from [place]. [facility].

## 2023-03-30 ENCOUNTER — Encounter: Payer: BC Managed Care – PPO | Admitting: Family Medicine

## 2023-04-20 IMAGING — MG MM DIGITAL SCREENING BILAT W/ TOMO AND CAD
8 of 14 series · 8 of 40 positions shown · non-contrast
Comparison: Previous exam(s).

CLINICAL DATA: Screening.

EXAM:
DIGITAL SCREENING BILATERAL MAMMOGRAM WITH TOMOSYNTHESIS AND CAD
TECHNIQUE: Bilateral screening digital craniocaudal and mediolateral oblique
mammograms were obtained. Bilateral screening digital breast
tomosynthesis was performed. The images were evaluated with
computer-aided detection.

[R CV synth-2D]
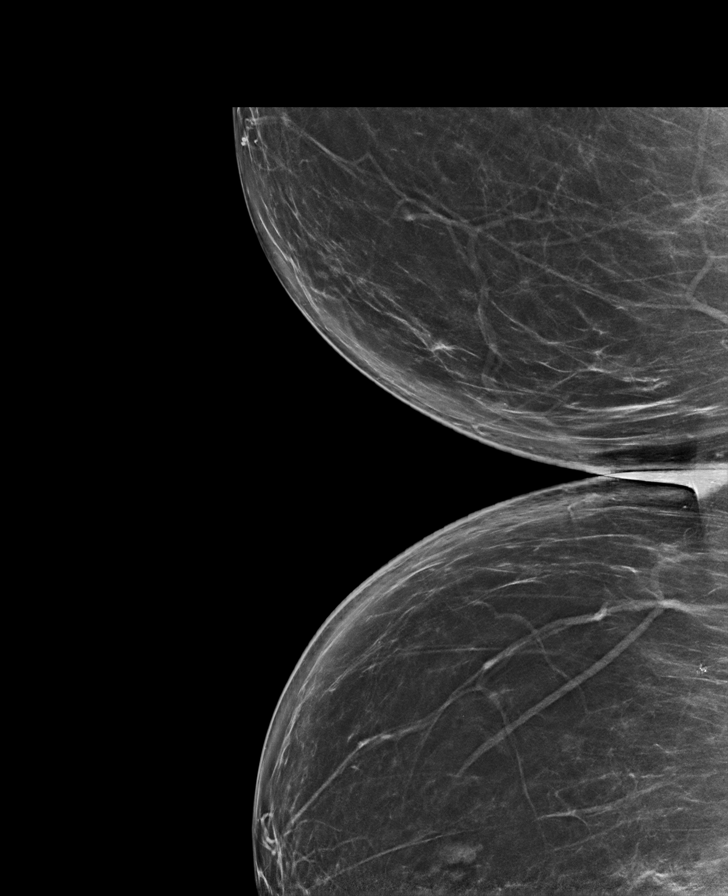

[R CC synth-2D]
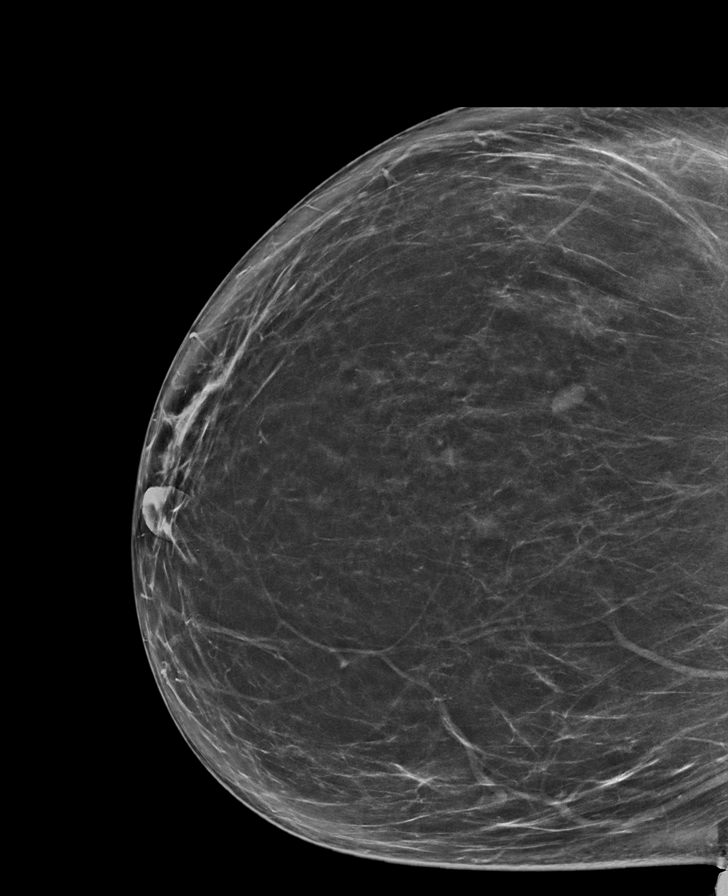

[L MLO synth-2D (1 of 2)]
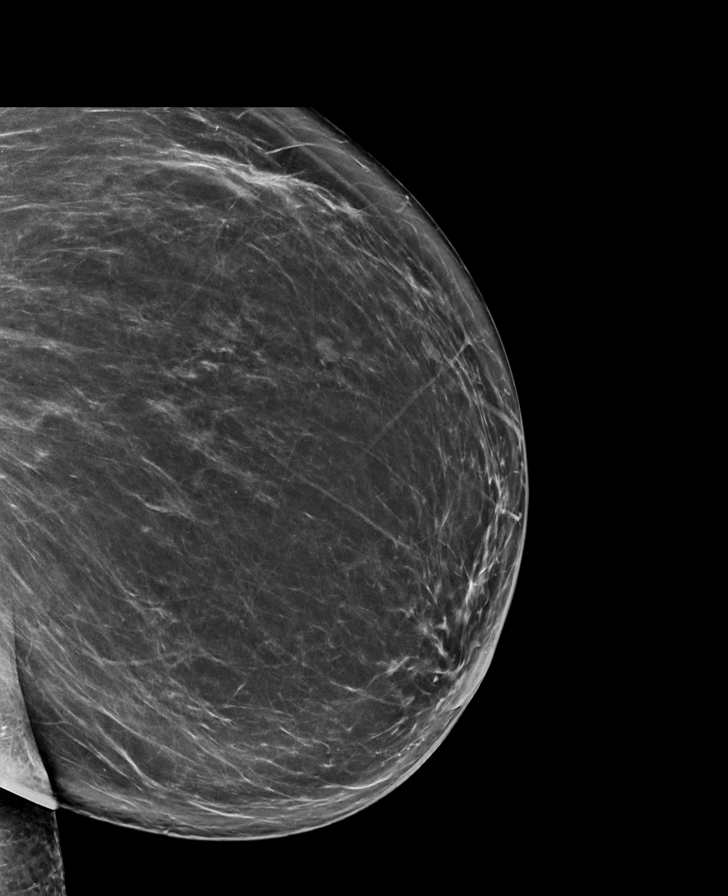

[L MLO synth-2D (2 of 2)]
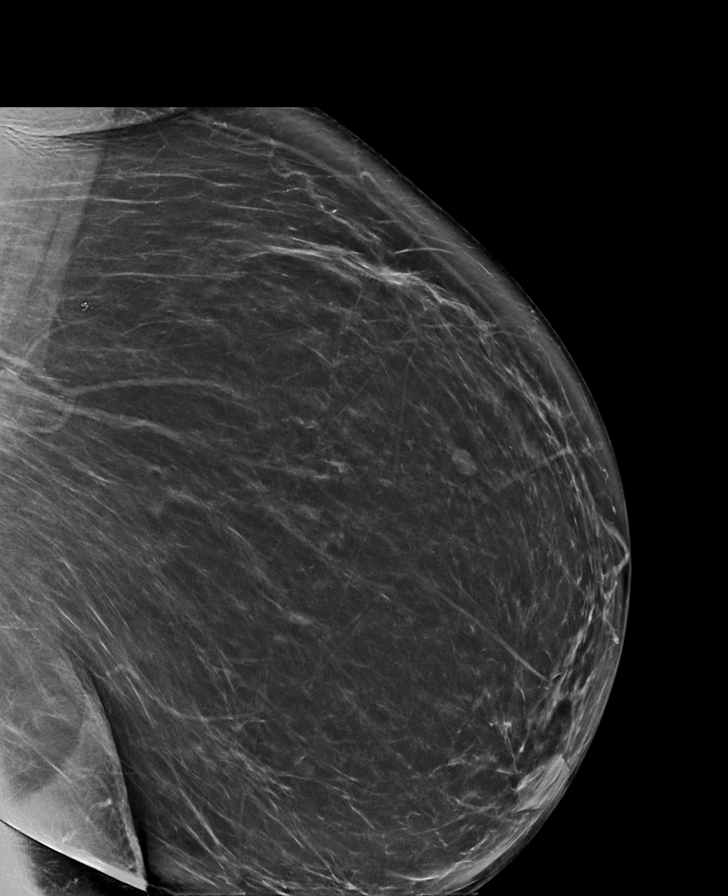

[R MLO synth-2D (1 of 2)]
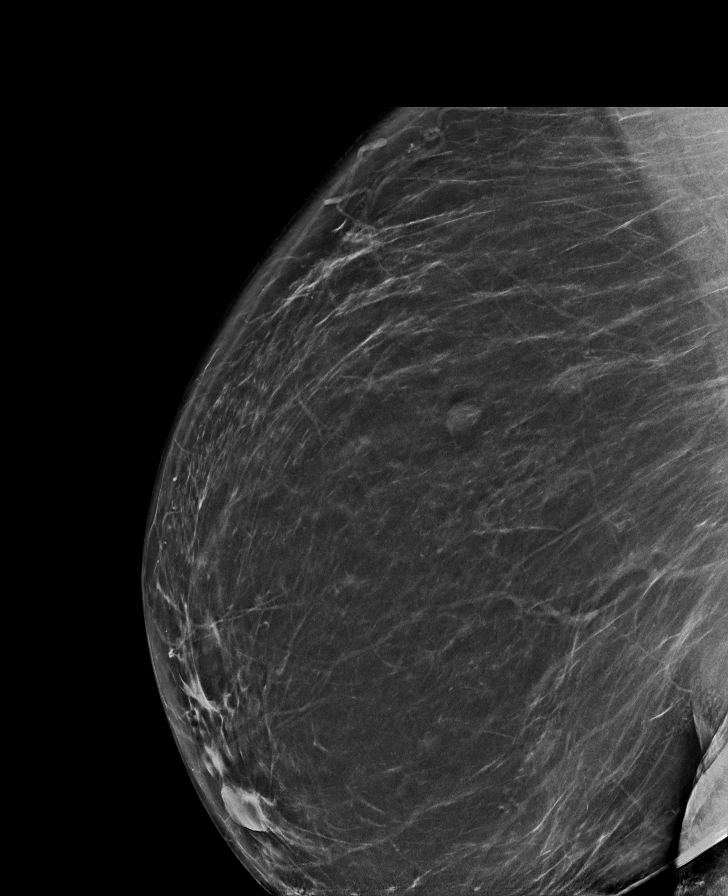

[R MLO synth-2D (2 of 2)]
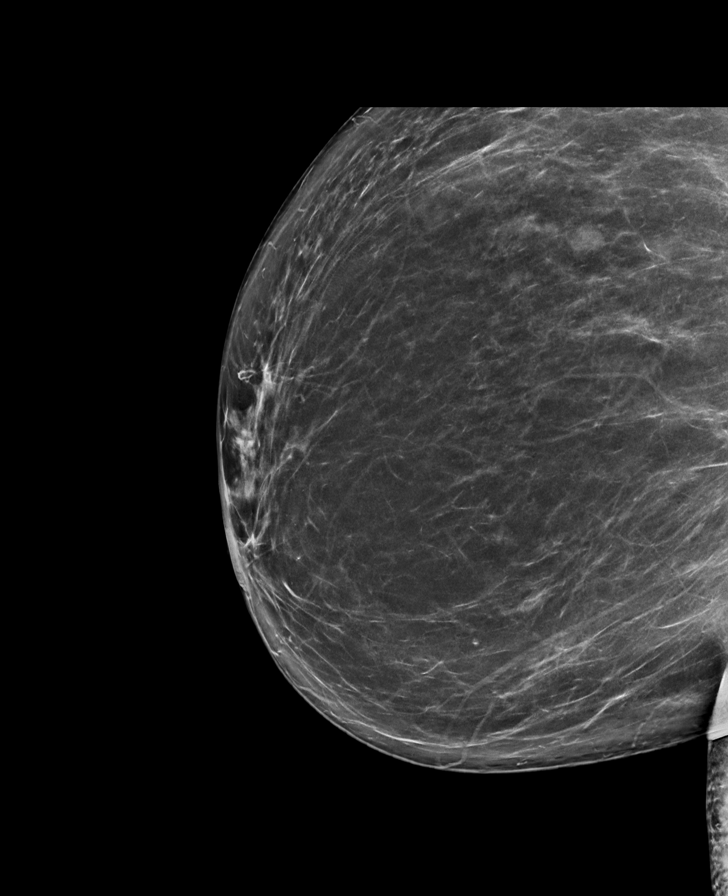

[L CC synth-2D]
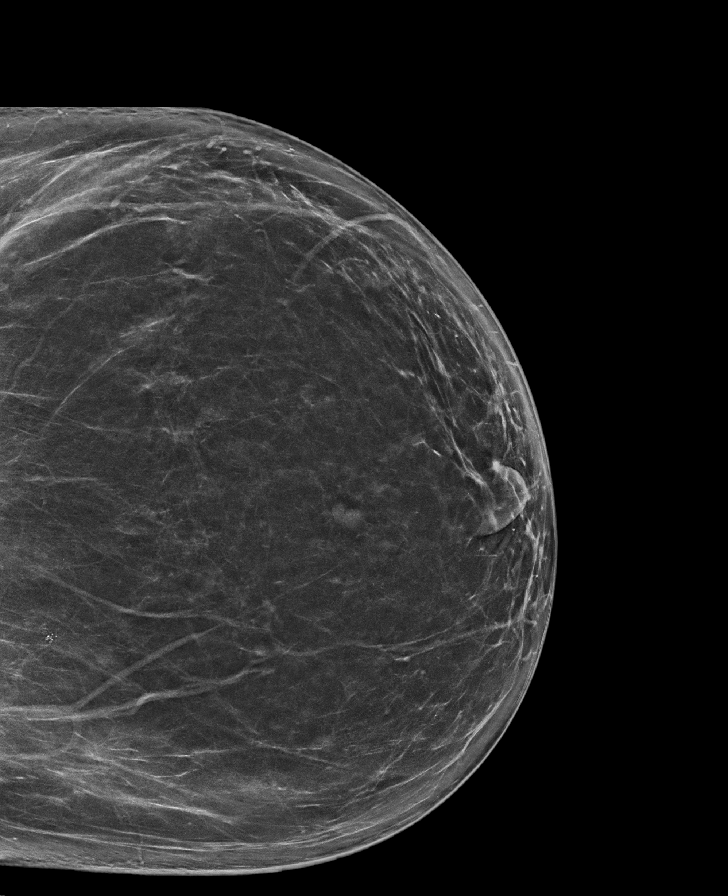

[R CV tomo · tomo slice 45/89.0]
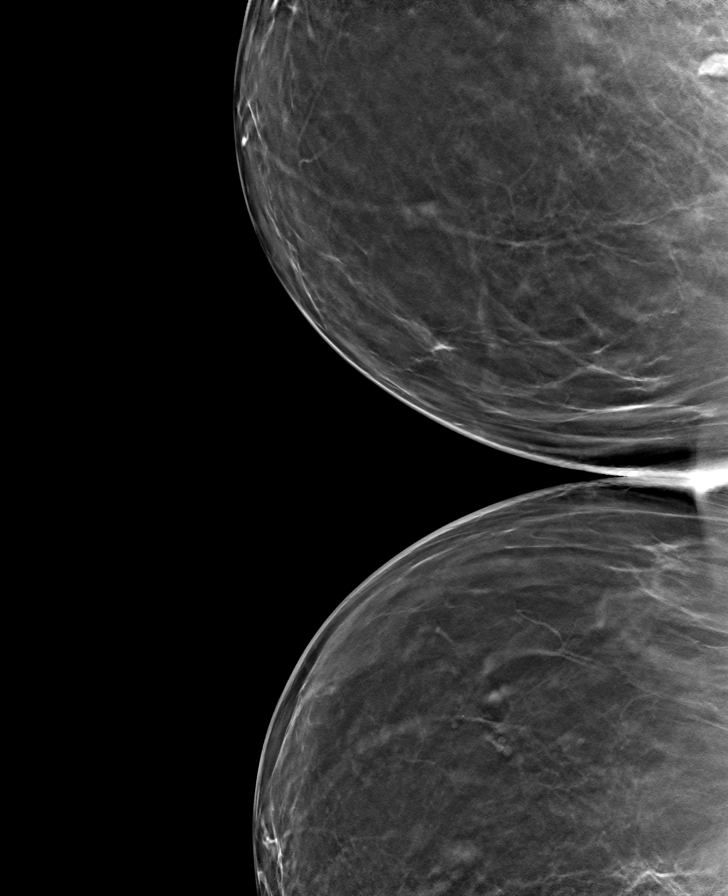

[8 of 40 positions shown; findings below may reference images not displayed]

ACR Breast Density Category b: There are scattered areas of
fibroglandular density.
FINDINGS: In the left breast, a possible mass warrants further evaluation. In
the right breast, no findings suspicious for malignancy.
IMPRESSION: Further evaluation is suggested for a possible mass in the left
breast.

RECOMMENDATION:
Diagnostic mammogram and possibly ultrasound of the left breast.
(Code:[id number]-[id number])

The patient will be contacted regarding the findings, and additional
imaging will be scheduled.

BI-RADS CATEGORY  0: Incomplete. Need additional imaging evaluation
and/or prior mammograms for comparison.

## 2023-05-23 ENCOUNTER — Encounter: Payer: BC Managed Care – PPO | Admitting: Family Medicine

## 2023-05-24 IMAGING — US US BREAST*L* LIMITED INC AXILLA
1 series · 8 of 8 positions shown · non-contrast
Comparison: Previous exam(s).

CLINICAL DATA: Screening recall for a possible left breast mass.

EXAM:
DIGITAL DIAGNOSTIC UNILATERAL LEFT MAMMOGRAM WITH TOMOSYNTHESIS AND
CAD; ULTRASOUND LEFT BREAST LIMITED
TECHNIQUE: Left digital diagnostic mammography and breast tomosynthesis was
performed. The images were evaluated with computer-aided detection.;
Targeted ultrasound examination of the left breast was performed.

[Series 1: us breast*left* limited inc axilla · 0.08mm/px · 8 of 8 slices shown]
[im 1/8]
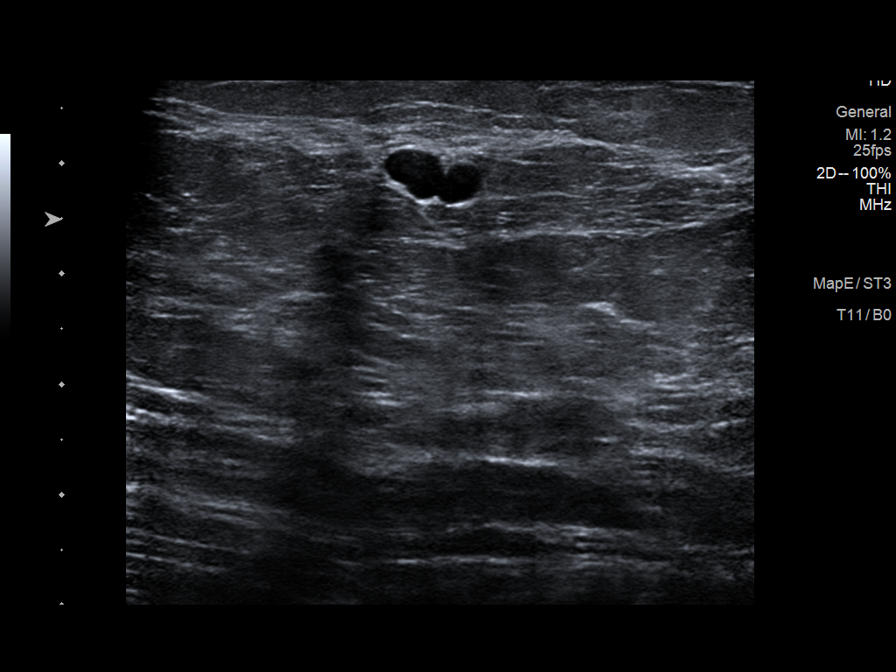
[im 2/8]
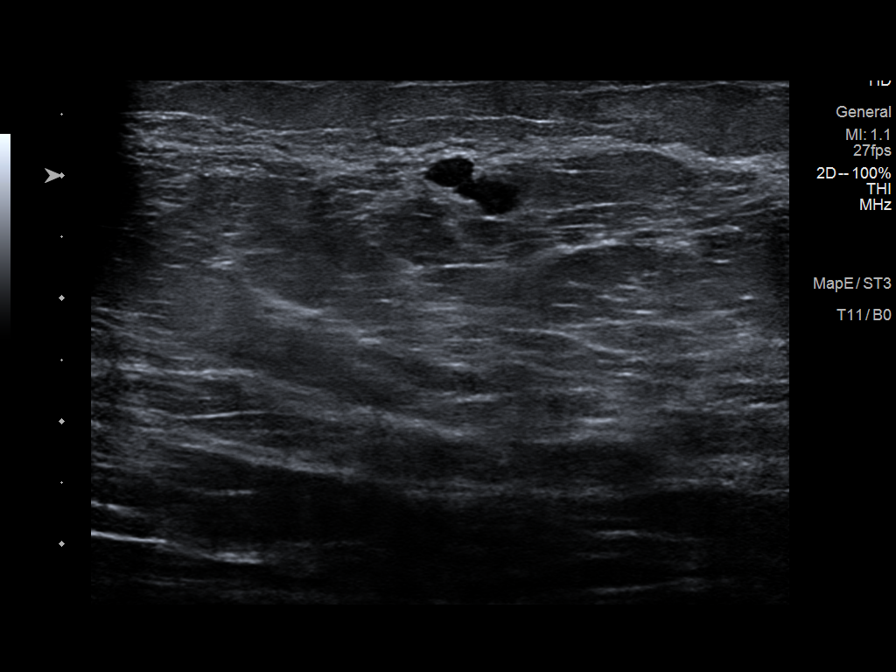
[im 3/8]
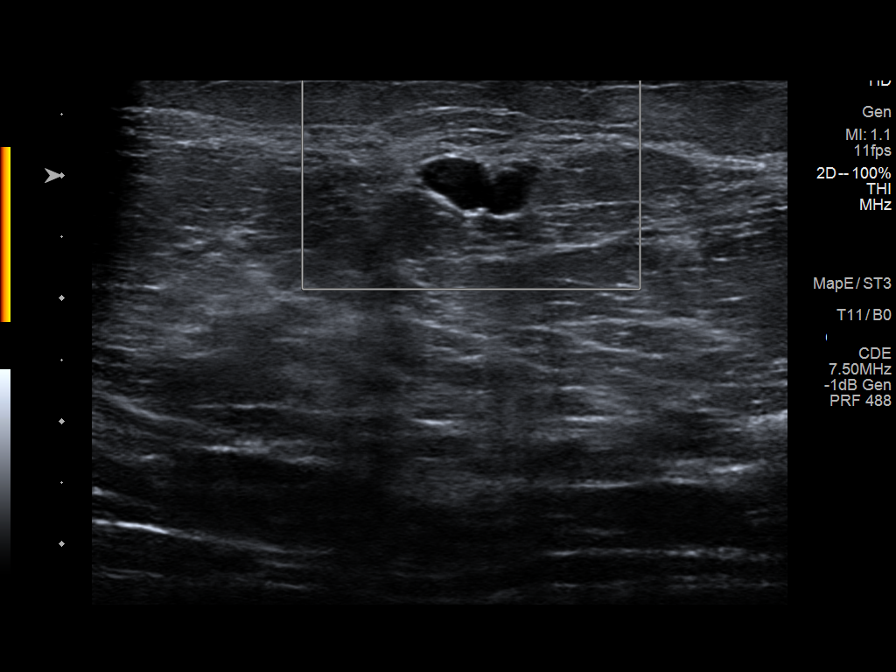
[im 4/8]
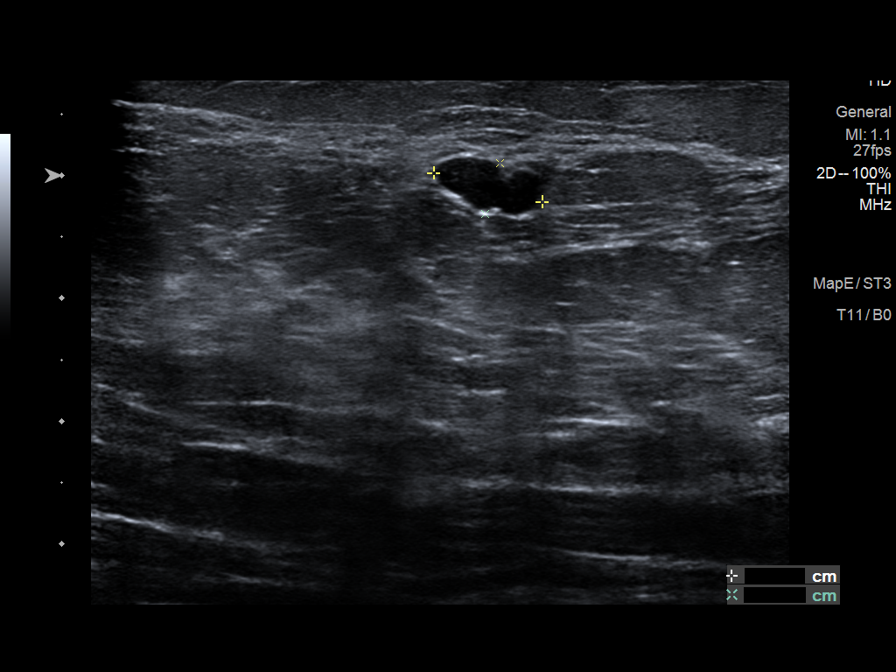
[im 5/8]
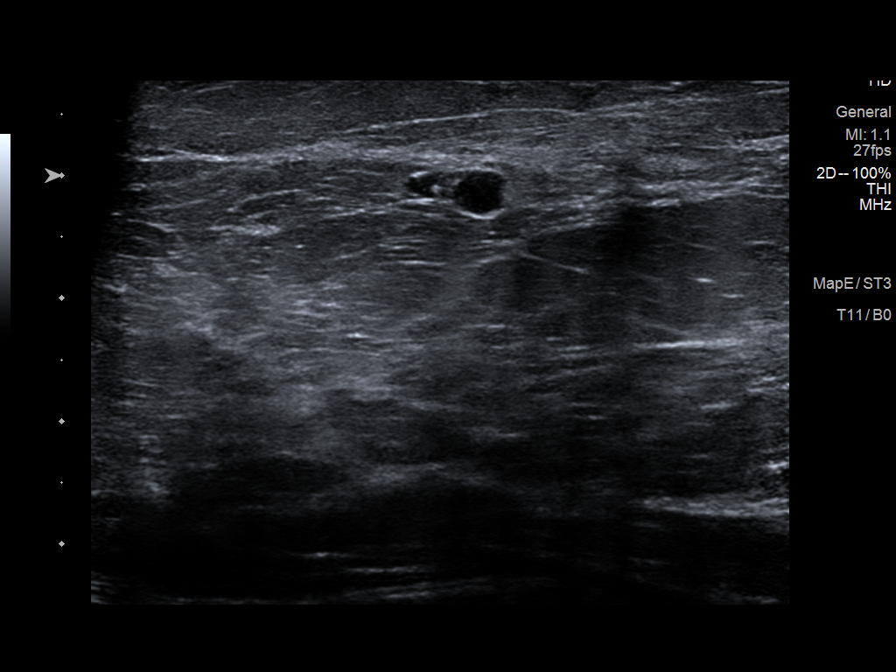
[im 6/8]
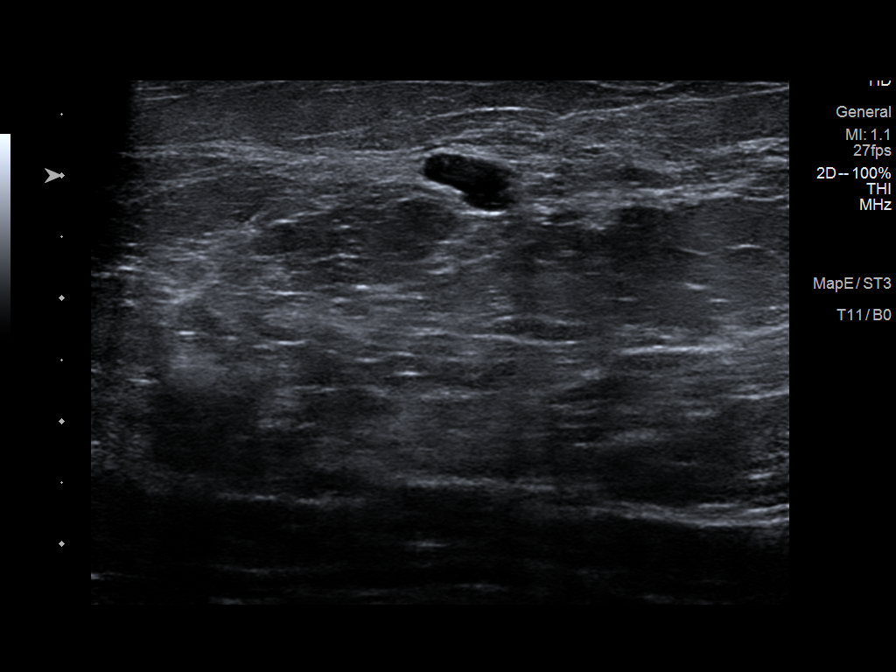
[im 7/8]
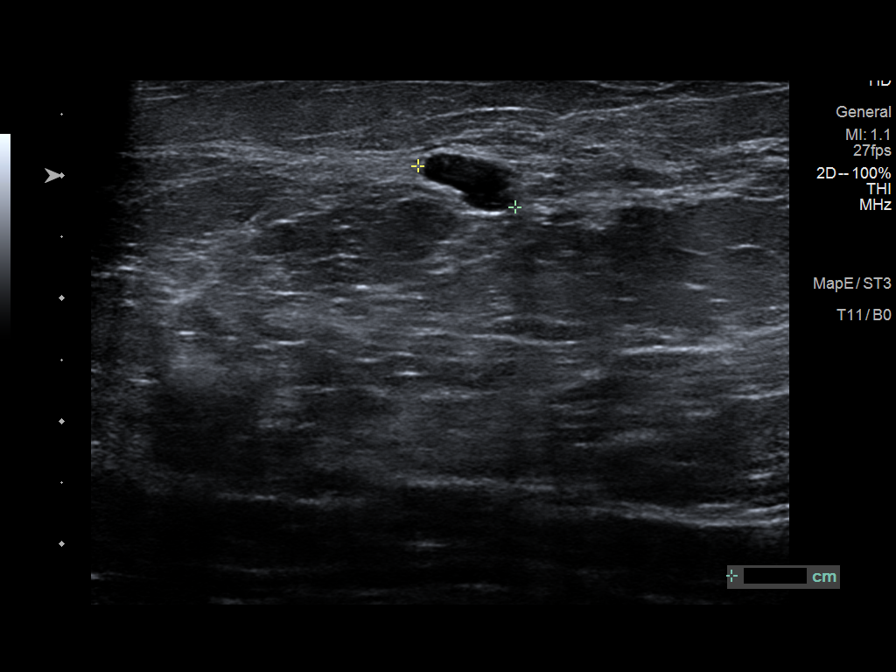
[im 8/8]
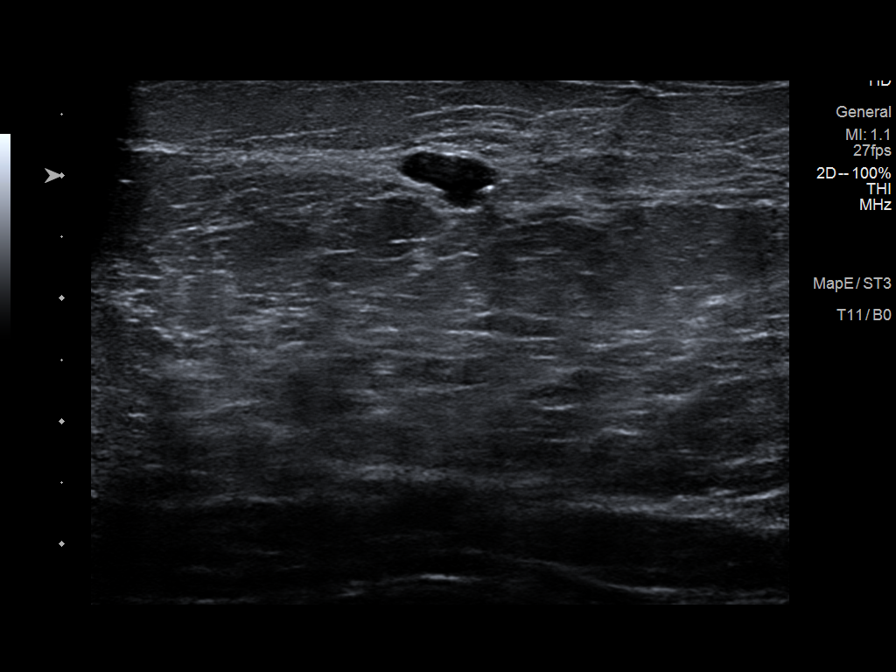

[8 of 8 positions shown; findings below may reference images not displayed]

ACR Breast Density Category b: There are scattered areas of
fibroglandular density.
FINDINGS: The possible mass noted on the screening study persists as an oval,
gently lobulated mass or group of masses spanning 9 mm in long axis,
lying in the upper outer quadrant, middle to anterior depth.

Targeted left breast ultrasound is performed, showing an oval cyst
with mildly lobulated, but circumscribed margins, and no other
complicating features, at 11:30 o'clock, 8 cm the nipple, measuring
9 x 4 x 9 mm, consistent in size, shape and location to the
mammographic mass. There are no solid masses or suspicious lesions.
IMPRESSION: 1. No evidence of breast malignancy.
2. Benign left breast cyst.

RECOMMENDATION:
Screening mammogram in one year.(Code:[id number])

I have discussed the findings and recommendations with the patient.
If applicable, a reminder letter will be sent to the patient
regarding the next appointment.

BI-RADS CATEGORY  2: Benign.

## 2023-06-15 ENCOUNTER — Encounter: Payer: 59 | Admitting: Family Medicine

## 2023-06-15 NOTE — Progress Notes (Unsigned)
No chief complaint on file.  Anita Taylor is a 58 y.o. female who presents for a complete physical.    She was last seen by me at her physical in 02/2022. She went to [facility] in December for bronchitis.   She reported last year that her husband was diagnosed with early onset dementia, and lost his job. He should be able to collect long term disability, financially they are okay.  He is still managing his daily activities, driving.  Patient takes care of the bills.  Obesity:  Was back under the care of [facility] (Dr. Sharee Taylor) from 04/2021 through 10/2022. Weight was 215# when she started there in 04/2021.  Wt Readings from Last 3 Encounters:  11/18/22 185 lb (83.9 kg)  10/07/22 198 lb (89.8 kg)  06/03/22 203 lb (92.1 kg)    Migraines:   She uses sumatriptan prn, last filled #10 in 09/2022.  This remains effective.  She uses Excedrin if taken early in the day (otherwise causes insomnia). Migraines occur about every 3 months, imitrex helps.   (Saw neuro in 11/2016 when having more frequent migraines, didn't tolerate the prescribed ER propranolol. Thinks some of the headaches were hormonal; headaches better since postmenopausal. She never had the MRI or sleep study as recommended by Dr. Lucia Taylor).   Postmenopausal--She is having some hot flashes and night sweats, tolerable.  Worse with caffeine or certain foods/sweets/wine. These remain tolerable, mainly at night.  Vitamin D deficiency:  Last level was slightly low at 29 in 04/2021.  She is currently taking  Component Ref Range & Units (hover) 1 yr ago (05/18/22) 2 yr ago (05/05/21) 2 yr ago (12/03/20) 3 yr ago (09/06/19) 4 yr ago (11/23/18) 5 yr ago (05/09/18) 5 yr ago (11/17/17)  Vit D, 25-Hydroxy 29.0 Low  49.0 CM 43.3 CM 33.9 CM 23.5 Low  CM 22.8 Low  CM 36.0 C    H/o borderline hyperlipidemia:  Improved in the past with dietary changes.  She tries to follow a lowfat, low cholesterol diet.  Doesn't eat much red meat, cheese, eggs.  +creamy  dressings.  ***UPDATE DIET           Component Ref Range & Units (hover) 1 yr ago (02/24/22) 2 yr ago (05/05/21) 2 yr ago (12/03/20) 3 yr ago (09/06/19) 4 yr ago (11/23/18) 5 yr ago (05/09/18) 6 yr ago (12/29/16)  Cholesterol, Total 205 High  232 High  211 High  190 223 High  206 High  171  Triglycerides 67 68 83 52 87 62 84  HDL 65 68 62 63 66 60 46  VLDL Cholesterol Cal 12 12 15 10 17 12 17   LDL Chol Calc (NIH) 128 High  152 High  134 High  117 High      Chol/HDL Ratio 3.2  3.4 CM  3.4 CM      Pre-diabetes:  Fasting sugars have been normal. A1c was borderline at 5.7% on last check in 04/2022.  Component Ref Range & Units (hover) 1 yr ago (05/18/22) 2 yr ago (05/05/21) 2 yr ago (12/03/20) 3 yr ago (09/06/19) 5 yr ago (05/09/18) 6 yr ago (12/29/16)  Hgb A1c MFr Bld 5.7 High  5.6 CM 5.8 High  CM 5.6 CM 5.5 CM 5.6 CM      Immunization History  Administered Date(s) Administered   Influenza Split 12/29/2011   Influenza,inj,Quad PF,6+ Mos 01/28/2016, 01/30/2017, 01/31/2019, 03/03/2021, 02/24/2022   Influenza-Unspecified 02/02/2017, 02/04/2018   PFIZER Comirnaty(Gray Top)Covid-19 Tri-Sucrose Vaccine 11/15/2020  PFIZER(Purple Top)SARS-COV-2 Vaccination 06/23/2019, 07/14/2019   PPD Test 01/19/2012   Pfizer Covid-19 Vaccine Bivalent Booster 39yrs & up 07/04/2021   Pfizer(Comirnaty)Fall Seasonal Vaccine 12 years and older 02/24/2022   Tdap 01/19/2012   Zoster Recombinant(Shingrix) 12/12/2020, 02/16/2021   Last Pap smear: 10/2017--normal, no high risk HPV present Last mammogram:  10/2022 Last colonoscopy: 11/2018, small internal hemorrhoid. Recheck 10 years Last DEXA: never  Dentist: twice yearly Ophtho: yearly Exercise:   Walks 2 miles 3x/week with a friend (or Walk Away the Pounds tape indoors, for 2 miles).   Weights??    PMH, PSH, SH and FH reviewed and updated     ROS:  The patient denies anorexia, fever, decreased hearing, ear pain, sore throat, breast concerns, chest  pain, palpitations, dizziness, syncope, dyspnea on exertion, cough, swelling, nausea, vomiting, diarrhea, constipation, abdominal pain, melena, hematochezia, indigestion/heartburn, hematuria, incontinence, dysuria, vaginal bleeding, discharge, odor or itch, genital lesions, joint pains, numbness, tingling, weakness, tremor, suspicious skin lesions, depression, anxiety, abnormal bleeding/bruising, or enlarged lymph nodes.  Hair thinning, unchanged  Constipation--controlled with magnesium daily Migraine headaches are infrequent.  Hot flashes, night sweats, tolerable Ashwagandha has helped with her moods/stress. H/o microscopic hematuria. Denies any urinary complaints.    PHYSICAL EXAM:  There were no vitals taken for this visit.  Wt Readings from Last 3 Encounters:  11/18/22 185 lb (83.9 kg)  10/07/22 198 lb (89.8 kg)  06/03/22 203 lb (92.1 kg)    General Appearance:     Alert, cooperative, no distress, appears stated age    Head:      Normocephalic, without obvious abnormality, atraumatic    Eyes:      PERRL, conjunctiva/corneas clear, EOM's intact, fundi benign    Ears:      Normal TM's and external ear canals    Nose:     No drainage or sinus tenderness  Throat:     Normal mucosa  Neck:     Supple, no lymphadenopathy;  thyroid: no enlargement/ tenderness/nodules; no carotid bruit or JVD    Back:      Spine nontender, no curvature, ROM normal, no CVA tenderness.   Lungs:       Clear to auscultation bilaterally without wheezes, rales or ronchi; respirations unlabored    Chest Wall:      No tenderness or deformity     Heart:      Regular rate and rhythm, S1 and S2 normal, no murmur, rub or gallop    Breast Exam:      No tenderness, masses, or nipple discharge or inversion.  No axillary lymphadenopathy    Abdomen:       Soft, non-tender, nondistended, normoactive bowel sounds,    no masses, no hepatosplenomegaly    Genitalia:      Normal external genitalia without lesions.  BUS and  vagina normal; no cervical motion tenderness or cervical lesions. No abnormal vaginal discharge.  Uterus and adnexa not enlarged, no masses or tenderness.  Pap performed    Rectal:      Normal tone, no masses or tenderness; guaiac negative stool    Extremities:     No clubbing, cyanosis or edema    Pulses:     2+ and symmetric all extremities    Skin:     Skin color, texture, turgor normal, no rashes or lesions  Keloid R shoulder.  Lymph nodes:     Cervical, supraclavicular, inguinal and axillary nodes normal    Neurologic:     Normal strength,  sensation and gait; reflexes 2+ and symmetric throughout               Psych:   Normal mood, affect, hygiene and grooming.    ***UPDATE SKIN--keloid R shoulder     02/24/2022    2:28 PM 05/05/2021    7:39 AM 12/03/2020    8:31 AM 12/03/2019    8:25 AM 09/06/2019    9:43 AM  Depression screen PHQ 2/9  Decreased Interest 0 1 0 0 1  Down, Depressed, Hopeless 0 0 0 0 1  PHQ - 2 Score 0 1 0 0 2  Altered sleeping  1   1  Tired, decreased energy  1   1  Change in appetite  0   1  Feeling bad or failure about yourself   0   1  Trouble concentrating  1   1  Moving slowly or fidgety/restless  0   0  Suicidal thoughts  0   0  PHQ-9 Score  4   7  Difficult doing work/chores  Not difficult at all   Not difficult at all     ASSESSMENT/PLAN:   Pap Tdap today Enter/give/decline  flu and COVID (not all 3 in 1 day)  TSH ONLY if having sx (delete if none)  Discussed monthly self breast exams and yearly mammograms; at least 30 minutes of aerobic activity at least 5 days/week, weight-bearing exercise 2-3x/wk; proper sunscreen use reviewed; healthy diet, including goals of calcium and vitamin D intake and alcohol recommendations (less than or equal to 1 drink/day) reviewed; regular seatbelt use; changing batteries in smoke detectors.  Immunization recommendations discussed--flu shot  TdaP COVID Colonoscopy recommendations reviewed, UTD.    F/u 1 year,  sooner prn for any concerns, or based on abnormal labs.

## 2023-06-15 NOTE — Patient Instructions (Incomplete)
  HEALTH MAINTENANCE RECOMMENDATIONS:  It is recommended that you get at least 30 minutes of aerobic exercise at least 5 days/week (for weight loss, you may need as much as 60-90 minutes). This can be any activity that gets your heart rate up. This can be divided in 10-15 minute intervals if needed, but try and build up your endurance at least once a week.  Weight bearing exercise is also recommended twice weekly.  Eat a healthy diet with lots of vegetables, fruits and fiber.  "Colorful" foods have a lot of vitamins (ie green vegetables, tomatoes, red peppers, etc).  Limit sweet tea, regular sodas and alcoholic beverages, all of which has a lot of calories and sugar.  Up to 1 alcoholic drink daily may be beneficial for women (unless trying to lose weight, watch sugars).  Drink a lot of water.  Calcium recommendations are 1200-1500 mg daily (1500 mg for postmenopausal women or women without ovaries), and vitamin D 1000 IU daily.  This should be obtained from diet and/or supplements (vitamins), and calcium should not be taken all at once, but in divided doses.  Monthly self breast exams and yearly mammograms for women over the age of 23 is recommended.  Sunscreen of at least SPF 30 should be used on all sun-exposed parts of the skin when outside between the hours of 10 am and 4 pm (not just when at beach or pool, but even with exercise, golf, tennis, and yard work!)  Use a sunscreen that says "broad spectrum" so it covers both UVA and UVB rays, and make sure to reapply every 1-2 hours.  Remember to change the batteries in your smoke detectors when changing your clock times in the spring and fall. Carbon monoxide detectors are recommended for your home.  Use your seat belt every time you are in a car, and please drive safely and not be distracted with cell phones and texting while driving.  Please try and get your flu shot earlier in the season (Sept/October 2025). Consider an updated COVID vaccine as  well.

## 2023-06-16 ENCOUNTER — Encounter: Payer: Self-pay | Admitting: Family Medicine

## 2023-06-16 ENCOUNTER — Ambulatory Visit: Payer: 59 | Admitting: Family Medicine

## 2023-06-16 ENCOUNTER — Other Ambulatory Visit (HOSPITAL_COMMUNITY)
Admission: RE | Admit: 2023-06-16 | Discharge: 2023-06-16 | Disposition: A | Discharge status: Home / Self Care | Payer: 59 | Source: Ambulatory Visit | Attending: Family Medicine | Admitting: Family Medicine

## 2023-06-16 VITALS — BP 118/70 | HR 60 | Ht 66.0 in | Wt 186.6 lb

## 2023-06-16 DIAGNOSIS — Z23 Encounter for immunization: Secondary | ICD-10-CM | POA: Diagnosis not present

## 2023-06-16 DIAGNOSIS — E7800 Pure hypercholesterolemia, unspecified: Secondary | ICD-10-CM | POA: Diagnosis not present

## 2023-06-16 DIAGNOSIS — Z0000 Encounter for general adult medical examination without abnormal findings: Secondary | ICD-10-CM

## 2023-06-16 DIAGNOSIS — E559 Vitamin D deficiency, unspecified: Secondary | ICD-10-CM | POA: Diagnosis not present

## 2023-06-16 DIAGNOSIS — G43909 Migraine, unspecified, not intractable, without status migrainosus: Secondary | ICD-10-CM

## 2023-06-16 DIAGNOSIS — R7303 Prediabetes: Secondary | ICD-10-CM | POA: Diagnosis not present

## 2023-06-16 DIAGNOSIS — E66811 Obesity, class 1: Secondary | ICD-10-CM

## 2023-06-16 LAB — POCT GLYCOSYLATED HEMOGLOBIN (HGB A1C): Hemoglobin A1C: 5.2 % (ref 4.0–5.6)

## 2023-06-16 LAB — LIPID PANEL

## 2023-06-16 MED ORDER — SUMATRIPTAN SUCCINATE 100 MG PO TABS: ORAL_TABLET | ORAL | 0 refills | Status: AC

## 2023-06-17 ENCOUNTER — Encounter: Payer: Self-pay | Admitting: Family Medicine

## 2023-06-17 LAB — CMP14+EGFR
ALT: 9 [IU]/L (ref 0–32)
AST: 18 [IU]/L (ref 0–40)
Albumin: 4.5 g/dL (ref 3.8–4.9)
Alkaline Phosphatase: 75 [IU]/L (ref 44–121)
BUN/Creatinine Ratio: 27 — ABNORMAL HIGH (ref 9–23)
BUN: 19 mg/dL (ref 6–24)
Bilirubin Total: 0.5 mg/dL (ref 0.0–1.2)
CO2: 22 mmol/L (ref 20–29)
Calcium: 10.1 mg/dL (ref 8.7–10.2)
Chloride: 104 mmol/L (ref 96–106)
Creatinine, Ser: 0.71 mg/dL (ref 0.57–1.00)
Globulin, Total: 2.8 g/dL (ref 1.5–4.5)
Glucose: 87 mg/dL (ref 70–99)
Potassium: 4.4 mmol/L (ref 3.5–5.2)
Sodium: 142 mmol/L (ref 134–144)
Total Protein: 7.3 g/dL (ref 6.0–8.5)
eGFR: 100 mL/min/{1.73_m2} (ref >59)

## 2023-06-17 LAB — LIPID PANEL
Cholesterol, Total: 219 mg/dL — ABNORMAL HIGH (ref 100–199)
HDL: 65 mg/dL (ref >39)
LDL CALC COMMENT:: 3.4 ratio (ref 0.0–4.4)
LDL Chol Calc (NIH): 141 mg/dL — ABNORMAL HIGH (ref 0–99)
Triglycerides: 72 mg/dL (ref 0–149)
VLDL Cholesterol Cal: 13 mg/dL (ref 5–40)

## 2023-06-17 LAB — VITAMIN D 25 HYDROXY (VIT D DEFICIENCY, FRACTURES): Vit D, 25-Hydroxy: 41.4 ng/mL (ref 30.0–100.0)

## 2023-06-20 ENCOUNTER — Encounter: Payer: Self-pay | Admitting: Family Medicine

## 2023-06-20 LAB — CYTOLOGY - PAP
Comment: NEGATIVE
Diagnosis: NEGATIVE
High risk HPV: NEGATIVE

## 2023-08-09 ENCOUNTER — Telehealth: Payer: Self-pay | Admitting: *Deleted

## 2023-08-09 ENCOUNTER — Telehealth: Payer: Self-pay | Admitting: Family Medicine

## 2023-08-09 NOTE — Telephone Encounter (Signed)
 Copied from CRM [id number]. Topic: General - Other >> Aug 09, 2023 12:25 PM Carlatta H wrote: Reason for CRM: Patient would like a call back regarding a form for her employer//She would like a call back from the office  Called patient.

## 2023-08-09 NOTE — Telephone Encounter (Signed)
 Pap smear form has been emailed to pt twice. I have also verified email to be correct.

## 2023-09-12 ENCOUNTER — Other Ambulatory Visit (HOSPITAL_COMMUNITY): Payer: Self-pay | Admitting: Ophthalmology

## 2023-09-12 ENCOUNTER — Encounter (HOSPITAL_COMMUNITY): Payer: Self-pay | Admitting: Ophthalmology

## 2023-09-12 DIAGNOSIS — H40013 Open angle with borderline findings, low risk, bilateral: Secondary | ICD-10-CM

## 2023-09-16 ENCOUNTER — Ambulatory Visit (HOSPITAL_COMMUNITY)
Admission: RE | Admit: 2023-09-16 | Discharge: 2023-09-16 | Disposition: A | Discharge status: Home / Self Care | Source: Ambulatory Visit | Attending: Ophthalmology | Admitting: Ophthalmology

## 2023-09-16 DIAGNOSIS — H40013 Open angle with borderline findings, low risk, bilateral: Secondary | ICD-10-CM | POA: Diagnosis present

## 2023-09-16 MED ORDER — GADOBUTROL 1 MMOL/ML IV SOLN
8.0000 mL | Freq: Once | INTRAVENOUS | Status: AC | PRN
Start: 2023-09-16 — End: 2023-09-16
  Administered 2023-09-16: 8 mL via INTRAVENOUS

## 2023-10-24 ENCOUNTER — Other Ambulatory Visit: Payer: Self-pay | Admitting: Family Medicine

## 2023-10-24 DIAGNOSIS — Z1231 Encounter for screening mammogram for malignant neoplasm of breast: Secondary | ICD-10-CM

## 2023-11-15 ENCOUNTER — Ambulatory Visit
Admission: RE | Admit: 2023-11-15 | Discharge: 2023-11-15 | Disposition: A | Discharge status: Home / Self Care | Source: Ambulatory Visit | Attending: Family Medicine | Admitting: Family Medicine

## 2023-11-15 DIAGNOSIS — Z1231 Encounter for screening mammogram for malignant neoplasm of breast: Secondary | ICD-10-CM

## 2024-06-28 ENCOUNTER — Encounter: Payer: 59 | Admitting: Family Medicine
